# Patient Record
Sex: Male | Born: 1950 | Race: White | Hispanic: No | Marital: Single | Smoking: Never smoker
Health system: Southern US, Community
[De-identification: ages and names within clinical notes are randomized; demographics above are authoritative.]

## PROBLEM LIST (undated history)

## (undated) DIAGNOSIS — Q909 Down syndrome, unspecified: Secondary | ICD-10-CM

## (undated) DIAGNOSIS — E119 Type 2 diabetes mellitus without complications: Secondary | ICD-10-CM

## (undated) HISTORY — DX: Type 2 diabetes mellitus without complications: E11.9

## (undated) HISTORY — DX: Down syndrome, unspecified: Q90.9

---

## 1998-02-08 ENCOUNTER — Other Ambulatory Visit: Admission: RE | Admit: 1998-02-08 | Discharge: 1998-02-08 | Payer: Self-pay | Admitting: *Deleted

## 1998-03-03 ENCOUNTER — Other Ambulatory Visit: Admission: RE | Admit: 1998-03-03 | Discharge: 1998-03-03 | Payer: Self-pay | Admitting: *Deleted

## 1998-04-15 ENCOUNTER — Encounter: Payer: Self-pay | Admitting: Surgery

## 1998-04-20 ENCOUNTER — Encounter: Payer: Self-pay | Admitting: Surgery

## 1998-04-21 ENCOUNTER — Inpatient Hospital Stay (HOSPITAL_COMMUNITY): Admission: RE | Admit: 1998-04-21 | Discharge: 1998-04-22 | Payer: Self-pay | Admitting: Surgery

## 1998-05-10 ENCOUNTER — Emergency Department (HOSPITAL_COMMUNITY): Admission: EM | Admit: 1998-05-10 | Discharge: 1998-05-10 | Payer: Self-pay | Admitting: Emergency Medicine

## 1998-05-10 ENCOUNTER — Encounter: Payer: Self-pay | Admitting: Emergency Medicine

## 2002-04-08 ENCOUNTER — Ambulatory Visit (HOSPITAL_COMMUNITY): Admission: RE | Admit: 2002-04-08 | Discharge: 2002-04-08 | Payer: Self-pay | Admitting: Gastroenterology

## 2002-05-11 ENCOUNTER — Emergency Department (HOSPITAL_COMMUNITY): Admission: EM | Admit: 2002-05-11 | Discharge: 2002-05-12 | Payer: Self-pay | Admitting: *Deleted

## 2002-05-25 ENCOUNTER — Encounter: Payer: Self-pay | Admitting: Specialist

## 2002-05-25 ENCOUNTER — Ambulatory Visit (HOSPITAL_COMMUNITY): Admission: RE | Admit: 2002-05-25 | Discharge: 2002-05-25 | Payer: Self-pay | Admitting: Specialist

## 2002-11-27 ENCOUNTER — Inpatient Hospital Stay (HOSPITAL_COMMUNITY): Admission: EM | Admit: 2002-11-27 | Discharge: 2002-12-01 | Payer: Self-pay | Admitting: Emergency Medicine

## 2002-11-27 ENCOUNTER — Encounter: Payer: Self-pay | Admitting: Emergency Medicine

## 2002-12-08 ENCOUNTER — Encounter: Admission: RE | Admit: 2002-12-08 | Discharge: 2002-12-08 | Payer: Self-pay | Admitting: Family Medicine

## 2003-01-06 ENCOUNTER — Encounter: Admission: RE | Admit: 2003-01-06 | Discharge: 2003-01-06 | Payer: Self-pay | Admitting: Family Medicine

## 2003-01-15 ENCOUNTER — Encounter: Admission: RE | Admit: 2003-01-15 | Discharge: 2003-01-15 | Payer: Self-pay | Admitting: Sports Medicine

## 2003-01-15 ENCOUNTER — Encounter: Payer: Self-pay | Admitting: Sports Medicine

## 2003-01-27 ENCOUNTER — Encounter: Payer: Self-pay | Admitting: Specialist

## 2003-01-27 ENCOUNTER — Ambulatory Visit (HOSPITAL_COMMUNITY): Admission: RE | Admit: 2003-01-27 | Discharge: 2003-01-27 | Payer: Self-pay | Admitting: Specialist

## 2003-08-10 ENCOUNTER — Ambulatory Visit (HOSPITAL_COMMUNITY): Admission: RE | Admit: 2003-08-10 | Discharge: 2003-08-10 | Payer: Self-pay | Admitting: Specialist

## 2003-08-31 ENCOUNTER — Ambulatory Visit (HOSPITAL_COMMUNITY): Admission: RE | Admit: 2003-08-31 | Discharge: 2003-08-31 | Payer: Self-pay | Admitting: Specialist

## 2003-09-03 ENCOUNTER — Emergency Department (HOSPITAL_COMMUNITY): Admission: RE | Admit: 2003-09-03 | Discharge: 2003-09-03 | Payer: Self-pay | Admitting: Specialist

## 2003-10-07 ENCOUNTER — Ambulatory Visit (HOSPITAL_COMMUNITY): Admission: RE | Admit: 2003-10-07 | Discharge: 2003-10-07 | Payer: Self-pay | Admitting: Specialist

## 2003-10-29 ENCOUNTER — Ambulatory Visit (HOSPITAL_COMMUNITY): Admission: RE | Admit: 2003-10-29 | Discharge: 2003-10-29 | Payer: Self-pay | Admitting: Specialist

## 2004-07-11 ENCOUNTER — Ambulatory Visit (HOSPITAL_COMMUNITY): Admission: RE | Admit: 2004-07-11 | Discharge: 2004-07-11 | Payer: Self-pay | Admitting: Specialist

## 2004-12-15 ENCOUNTER — Ambulatory Visit: Payer: Self-pay | Admitting: Gastroenterology

## 2004-12-28 ENCOUNTER — Ambulatory Visit: Payer: Self-pay | Admitting: Gastroenterology

## 2004-12-28 ENCOUNTER — Ambulatory Visit (HOSPITAL_COMMUNITY): Admission: RE | Admit: 2004-12-28 | Discharge: 2004-12-28 | Payer: Self-pay | Admitting: Gastroenterology

## 2006-10-17 DIAGNOSIS — E119 Type 2 diabetes mellitus without complications: Secondary | ICD-10-CM

## 2007-03-25 ENCOUNTER — Ambulatory Visit: Payer: Self-pay | Admitting: Gastroenterology

## 2007-04-04 ENCOUNTER — Ambulatory Visit: Payer: Self-pay | Admitting: Gastroenterology

## 2007-07-24 ENCOUNTER — Ambulatory Visit (HOSPITAL_BASED_OUTPATIENT_CLINIC_OR_DEPARTMENT_OTHER): Admission: RE | Admit: 2007-07-24 | Discharge: 2007-07-24 | Payer: Self-pay | Admitting: Specialist

## 2007-08-23 ENCOUNTER — Ambulatory Visit: Payer: Self-pay | Admitting: Internal Medicine

## 2008-04-27 ENCOUNTER — Emergency Department (HOSPITAL_COMMUNITY): Admission: EM | Admit: 2008-04-27 | Discharge: 2008-04-27 | Payer: Self-pay | Admitting: Emergency Medicine

## 2008-05-24 ENCOUNTER — Encounter: Admission: RE | Admit: 2008-05-24 | Discharge: 2008-05-24 | Payer: Self-pay | Admitting: Specialist

## 2010-05-09 IMAGING — CR DG KNEE COMPLETE 4+V*L*
4 series · 4 of 4 positions shown · non-contrast
Comparison: None.

CLINICAL DATA: Left knee pain.  Fell yesterday.

LEFT KNEE - COMPLETE 4+ VIEW

[view not recorded (1 of 4)]
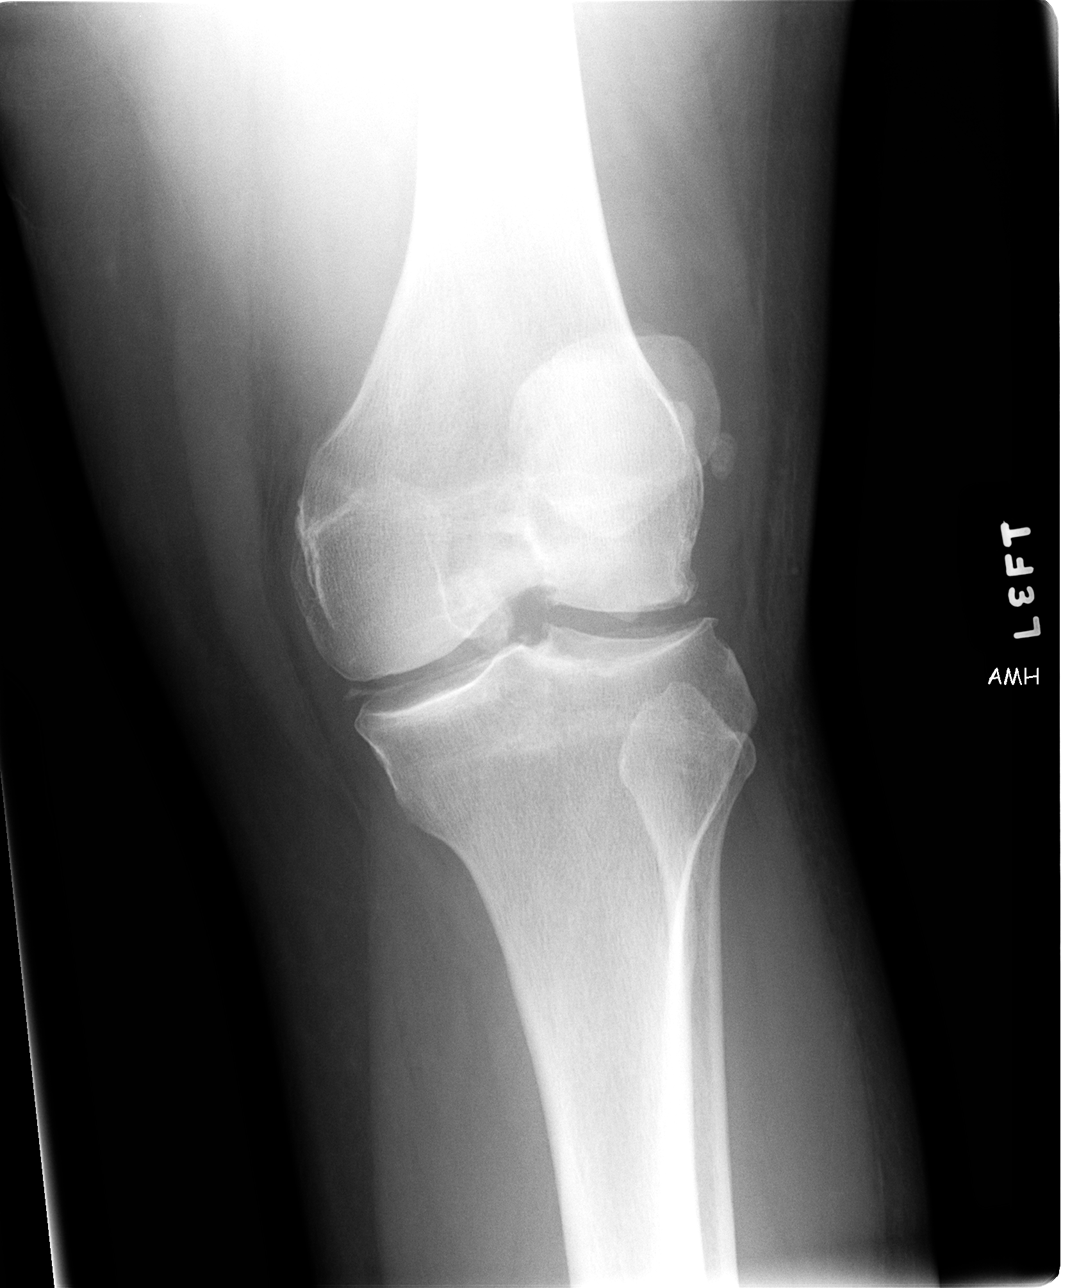

[view not recorded (2 of 4)]
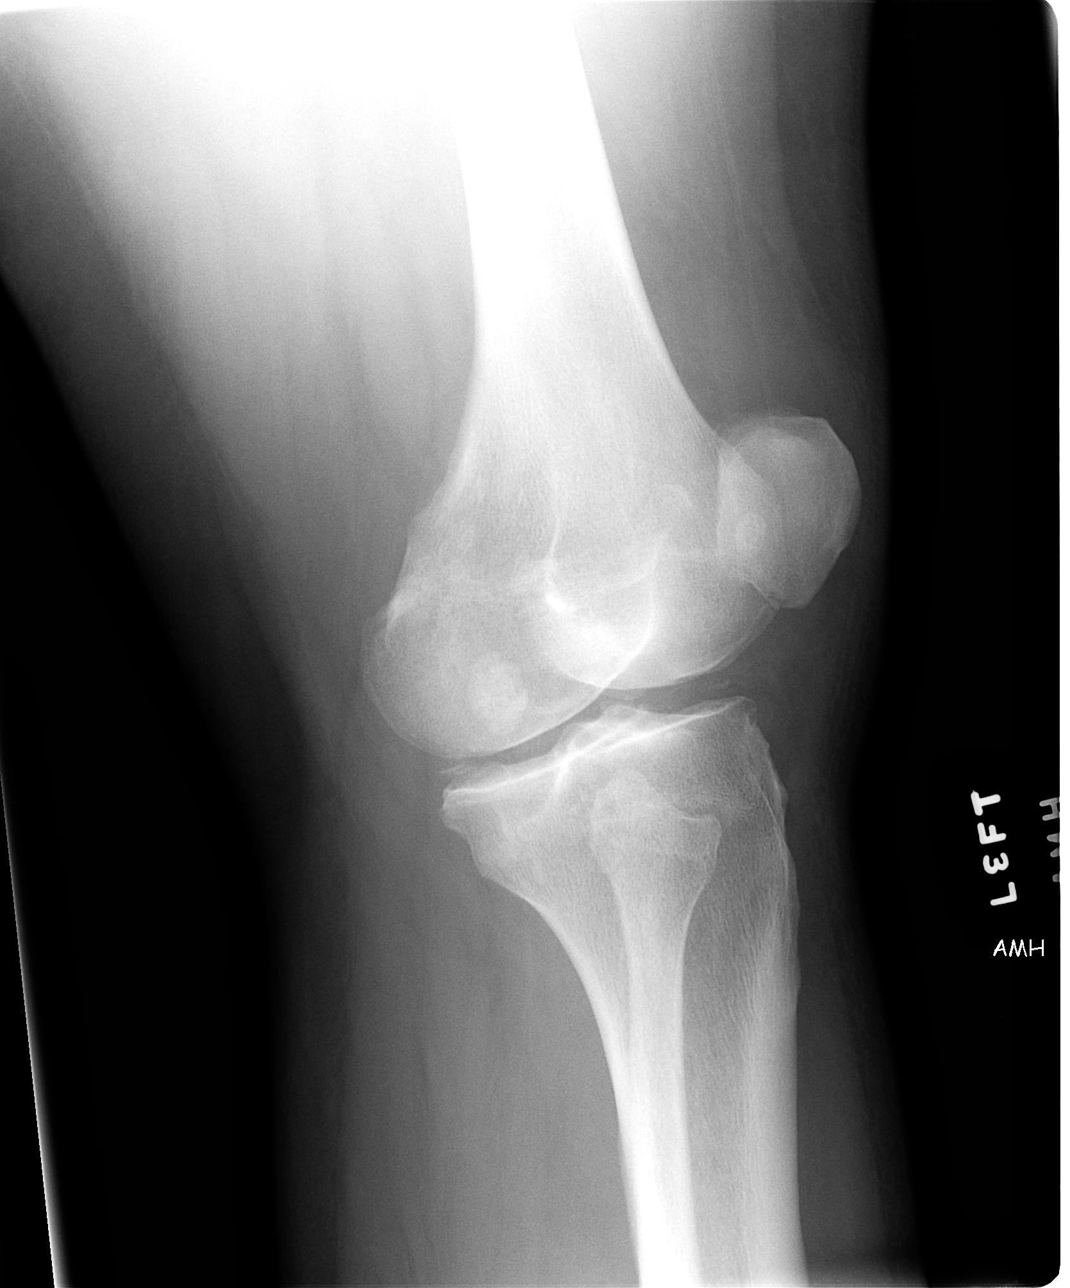

[view not recorded (3 of 4)]
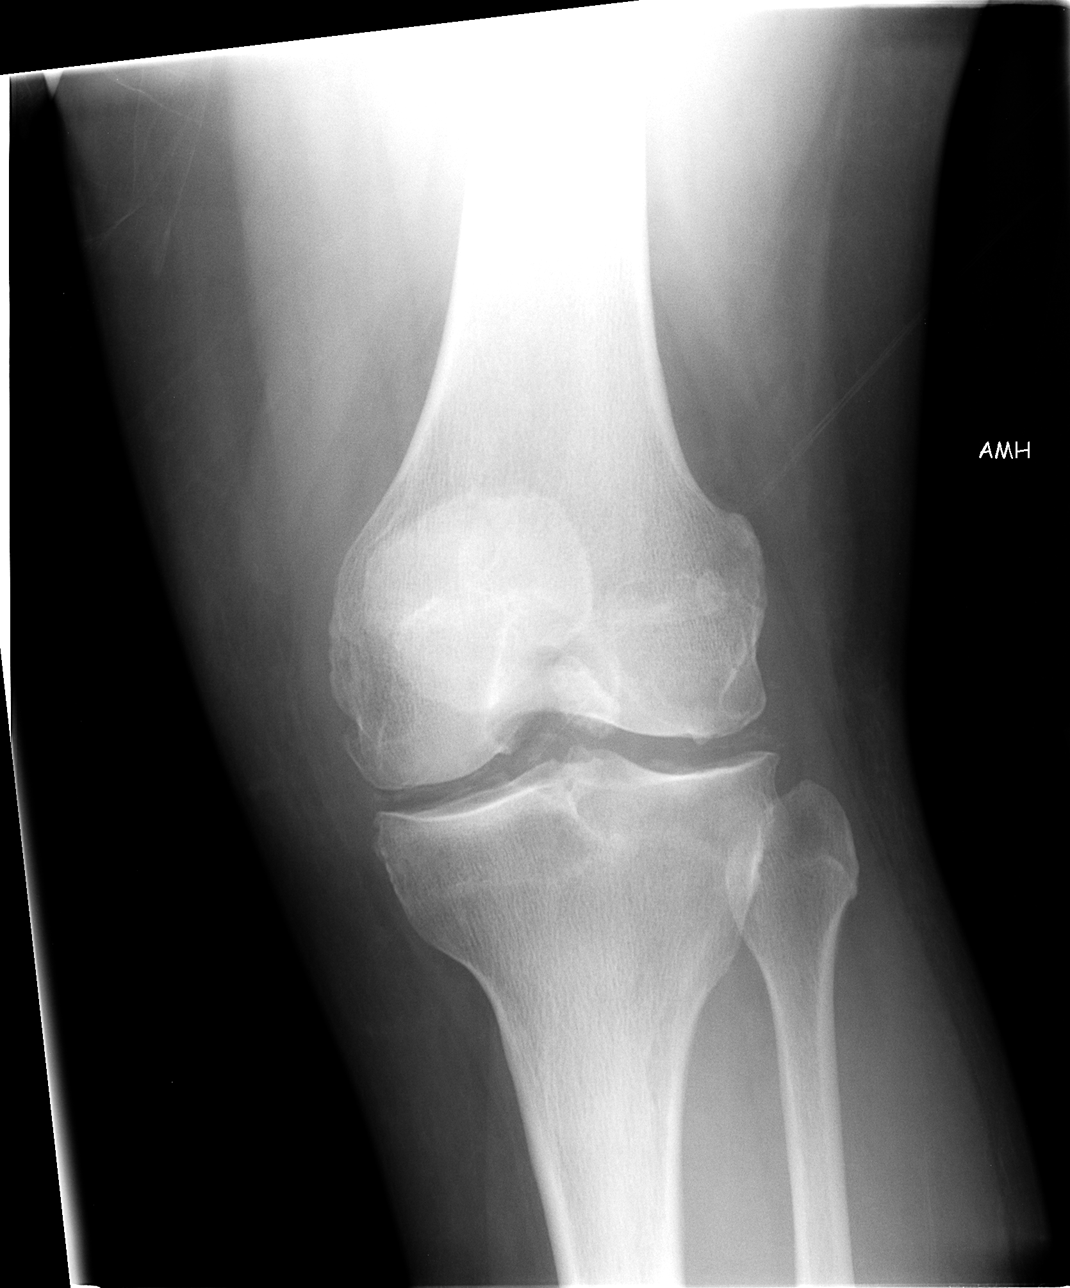

[view not recorded (4 of 4)]
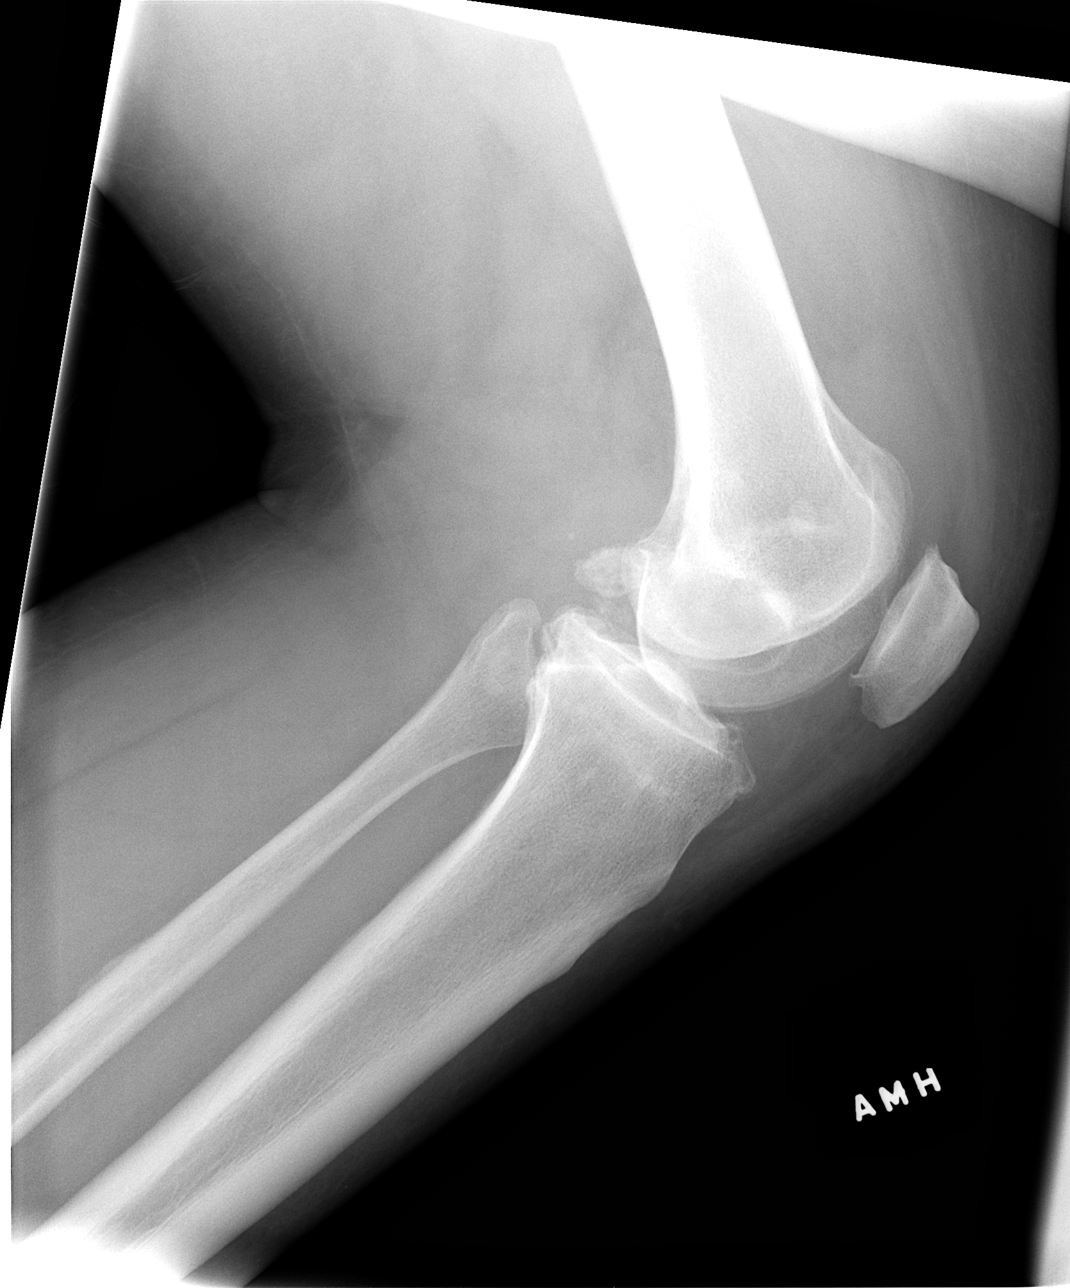

[4 of 4 positions shown; findings below may reference images not displayed]

FINDINGS: Medial and lateral meniscal calcifications.  1.9 cm
posterior intercondylar notch loose body.  Additional smaller
posterior calcified loose bodies.  Mild lateral and posterior
patellar spur formation.  Moderate sized effusion.   No fracture or
dislocation seen.
IMPRESSION: Chondrocalcinosis, degenerative changes, loose bodies and small to
moderate sized effusion.  No fracture or dislocation seen.

## 2010-06-02 ENCOUNTER — Encounter (INDEPENDENT_AMBULATORY_CARE_PROVIDER_SITE_OTHER): Payer: Self-pay | Admitting: *Deleted

## 2010-06-02 ENCOUNTER — Encounter: Admission: RE | Admit: 2010-06-02 | Discharge: 2010-06-02 | Payer: Self-pay | Admitting: Specialist

## 2010-08-02 ENCOUNTER — Encounter: Payer: Self-pay | Admitting: Gastroenterology

## 2010-08-04 ENCOUNTER — Encounter: Payer: Self-pay | Admitting: Gastroenterology

## 2010-08-17 ENCOUNTER — Encounter: Payer: Self-pay | Admitting: Gastroenterology

## 2010-08-24 ENCOUNTER — Encounter: Payer: Self-pay | Admitting: Gastroenterology

## 2010-09-06 ENCOUNTER — Encounter: Payer: Self-pay | Admitting: Gastroenterology

## 2010-09-09 ENCOUNTER — Encounter: Payer: Self-pay | Admitting: Specialist

## 2010-09-14 ENCOUNTER — Ambulatory Visit
Admission: RE | Admit: 2010-09-14 | Discharge: 2010-09-14 | Payer: Self-pay | Source: Home / Self Care | Attending: Gastroenterology | Admitting: Gastroenterology

## 2010-09-14 ENCOUNTER — Other Ambulatory Visit: Payer: Self-pay | Admitting: Gastroenterology

## 2010-09-14 DIAGNOSIS — R1013 Epigastric pain: Secondary | ICD-10-CM | POA: Insufficient documentation

## 2010-09-14 DIAGNOSIS — Q909 Down syndrome, unspecified: Secondary | ICD-10-CM | POA: Insufficient documentation

## 2010-09-14 LAB — URINALYSIS, ROUTINE W REFLEX MICROSCOPIC
Leukocytes, UA: NEGATIVE
Specific Gravity, Urine: <=1.005 (ref 1.000–1.030)
Urobilinogen, UA: 0.2 (ref 0.0–1.0)

## 2010-09-21 NOTE — Assessment & Plan Note (Signed)
Summary: ABD PAIN/YF    History of Present Illness Visit Type: Initial Consult Primary GI MD: Melvia Heaps MD [facility] Primary Provider: Nicolasa Ducking, MD Requesting Provider: Dr Nicolasa Ducking Chief Complaint: Patient c/o lower abdominal pain for several weeks. History of Present Illness:   [name] [name] is a 60 year old white male with Down's syndrome referred at the request of Dr. Midge Aver for evaluation of abdominal pain.  History is very limited because of the patient's inability to communicate effectively.  He complains of some upper abdominal discomfort that may be worsened postprandially.  He also points to the suprapubic area and claims to have pain.  Prior workup includes a CT scan from December, 2011 that demonstrated bilateral renal stones, a right ureteral stone and a fatty liver.  Question of undescended testes was also raised.  Lab work in December, 2011 was pertinent for normal urinalysis, CBC and electrolytes.  The patient has  diabetes.  He is on no gastric irritants including nonsteroidals.   GI Review of Systems    Reports abdominal pain and  vomiting.     Location of  Abdominal pain: lower abdomen.    Denies acid reflux, belching, bloating, chest pain, dysphagia with liquids, dysphagia with solids, heartburn, loss of appetite, nausea, vomiting blood, weight loss, and  weight gain.      Reports diarrhea.     Denies anal fissure, black tarry stools, change in bowel habit, constipation, diverticulosis, fecal incontinence, heme positive stool, hemorrhoids, irritable bowel syndrome, jaundice, light color stool, liver problems, rectal bleeding, and  rectal pain. Preventive Screening-Counseling & Management  Alcohol-Tobacco     Smoking Status: never      Drug Use:  no.      Current Medications (verified): 1)  Actos 15 Mg Tabs (Pioglitazone Hcl) .Marland Kitchen.. 1 By Mouth Once Daily 2)  Lipitor 10 Mg Tabs (Atorvastatin Calcium) .Marland Kitchen.. 1 By Mouth Once Daily 3)  Nasacort Aq 55 Mcg/act Aers  (Triamcinolone Acetonide) .... 2 Sprays Once Daily 4)  Glimepiride 4 Mg Tabs (Glimepiride) .... Take 1 Tablet By Mouth At Supper Time 5)  Acetaminophen 500 Mg Tabs (Acetaminophen) .... Take 1 Tablet By Mouth Two Times A Day For Degenerative Joint Disease 6)  Aquaphor  Oint (Emollient) .... Apply To Hands and Affected Dry Skin Twice Daily 7)  Clindamycin Phosphate 1 % Lotn (Clindamycin Phosphate) .... Apply To Face Twice A Day For Skin Care 8)  Docqlace 100 Mg Caps (Docusate Sodium) .... Take 2 Capsules By Mouth At Bedtime For Constipation 9)  Janumet 50-1000 Mg Tabs (Sitagliptin-Metformin Hcl) .... Take 1 Tablet By Mouth Two Times A Day 10)  Tamsulosin Hcl 0.4 Mg Caps (Tamsulosin Hcl) .... Take 1 Capsule By Mouth Once Daily 11)  Hydrocodone-Acetaminophen 5-325 Mg Tabs (Hydrocodone-Acetaminophen) .... Take 1 Tablet By Mouth Every 6 Hours As Needed For Pain 12)  Vitamin  A& D Ointment .... Apply To Buttocks Three Times A Day As Needed  Allergies (verified): No Known Drug Allergies  Past History:  Past Medical History: Down`s syndrome with Mental retardation Diabetes Hyperlipidemia Degenerative Joint Disease  Past Surgical History: unremarkable  Family History: Reviewed history from 10/17/2006 and no changes required. unknown  Social History: Lives in a group home; works for [employer] Patient has never smoked.  Alcohol Use - no Illicit Drug Use - no Smoking Status:  never Drug Use:  no  Review of Systems  The patient denies allergy/sinus, anemia, anxiety-new, arthritis/joint pain, back pain, blood in urine, breast changes/lumps, change in  vision, confusion, cough, coughing up blood, depression-new, fainting, fatigue, fever, headaches-new, hearing problems, heart murmur, heart rhythm changes, itching, menstrual pain, muscle pains/cramps, night sweats, nosebleeds, pregnancy symptoms, shortness of breath, skin rash, sleeping problems, sore throat, swelling of feet/legs,  swollen lymph glands, thirst - excessive , urination - excessive , urination changes/pain, urine leakage, vision changes, and voice change.         The patient denies symptoms though history is unreliable  Vital Signs:  Patient profile:   60 year old male Weight:      188.50 pounds Pulse rate:   76 / minute Pulse rhythm:   regular BP sitting:   102 / 68  (left arm)  Vitals Entered By: [name] [name] CMA Duncan Dull) (September 14, 2010 11:10 AM)  Physical Exam  Additional Exam:  Physical Exam He Is a Heavyset Male  skin: anicteric HEENT: normocephalic; PEERLA; no nasal or pharyngeal abnormalities neck: supple nodes: no cervical lymphadenopathy chest: clear to ausculatation and percussion heart: no  gallops, or rubs; there is a 1/6 early systolic murmur heard best at the left sternal border abd: soft, nontender; BS normoactive; no abdominal masses, tenderness, organomegaly rectal: deferred ext: no cynanosis, clubbing, edema skeletal: no deformities neuro: oriented x 3; no focal abnormalities    Impression & Recommendations:  Problem # 1:  ABDOMINAL PAIN, EPIGASTRIC (ICD-789.06) History is very limited and symptoms are nonspecific.  Physical exam is unremarkable.  He may have ulcer or nonulcer dyspepsia.  Lab work and prior CT were unrevealing.  Recommendations #1 trial of PPI therapy.  We will start omeprazole 20 mg daily.  If the patient continues to complain of upper abdominal pain despite medications I would consider upper endoscopy. #2 check urinalysis since he is pointing to the suprapubic area Orders: TLB-Udip w/ Micro (81001-URINE)  Problem # 2:  DIABETES MELLITUS II, UNCOMPLICATED (ICD-250.00)  Problem # 3:  DOWNS SYNDROME (ICD-758.0) Assessment: Comment Only  Patient Instructions: 1)  Copy sent to : Nicolasa Ducking, MD 2)  Go to the basment for labs today 3)  The medication list was reviewed and reconciled.  All changed / newly prescribed medications were  explained.  A complete medication list was provided to the patient / caregiver.

## 2010-09-21 NOTE — Procedures (Signed)
Summary: colonoscopy   Colonoscopy  Procedure date:  12/28/2004  Findings:      Results: Normal. Location:  [facility].   Patient Name: [name] [name], [name] [name]. MRN: [id number] Procedure Procedures: Colonoscopy CPT: [phone].  Personnel: Endoscopist: Barbette Hair. Arlyce Dice, MD.  Patient Consent: Procedure, Alternatives, Risks and Benefits discussed, consent obtained,  Indications Symptoms: Hematochezia.  History  Current Medications: Patient is not currently taking Coumadin.  Pre-Exam Physical: Performed Dec 28, 2004. Cardio-pulmonary exam, Rectal exam, HEENT exam , Abdominal exam WNL.  Exam Exam: Extent of exam reached: Cecum, extent intended: Cecum.  The cecum was identified by IC valve. Colon retroflexion performed. ASA Classification: II. Tolerance: good.  Monitoring: Pulse and BP monitoring, Oximetry used. Supplemental O2 given. at 2 Liters.  Colon Prep Used Miralax for colon prep. Prep results: fair, adequate exam.  Sedation Meds: Patient assessed and found to be appropriate for moderate (conscious) sedation. Fentanyl 50 mcg. given IV. Versed 5 mg. given IV.  Findings - NORMAL EXAM: Cecum to Rectum.   Assessment Normal examination.  Events  Unplanned Interventions: No intervention was required.  Unplanned Events: There were no complications. Plans  Post Exam Instructions: Post sedation instructions given.  Patient Education: Patient given standard instructions for: Hemorrhoids. a normal exam.  Comments: Probable limited bleeding secondary to hemorrhoids Scheduling/Referral: Follow-Up prn.  This report was created from the original endoscopy report, which was reviewed and signed by the above listed endoscopist.

## 2010-09-21 NOTE — Procedures (Signed)
Summary: colonoscopy   Colonoscopy  Procedure date:  04/04/2007  Findings:      Results: Normal. Location:  [facility].    Comments:      Repeat colonoscopy in 10 years.   Patient Name: [name] [name], [name] [name]. MRN: [id number] Procedure Procedures: Colonoscopy CPT: [phone].  Personnel: Endoscopist: Barbette Hair. Arlyce Dice, MD.  Patient Consent: Procedure, Alternatives, Risks and Benefits discussed, consent obtained, from patient.  Indications  Average Risk Screening Routine.  History  Current Medications: Patient is not currently taking Coumadin.  Pre-Exam Physical: Performed Apr 04, 2007. Cardio-pulmonary exam, HEENT exam , Abdominal exam, Mental status exam WNL.  Comments: Patient history reviewed/updated, physical performed prior to initiation of sedation?yes Exam Exam: Extent of exam reached: Cecum, extent intended: Cecum.  The cecum was identified by appendiceal orifice and IC valve. Time to Cecum: 00:05: 15. Time for Withdrawl: 00:04:28. Colon retroflexion performed. ASA Classification: II. Tolerance: good.  Monitoring: Pulse and BP monitoring, Oximetry used. Supplemental O2 given. at 2 Liters.  Colon Prep Used Miralax for colon prep. Prep results: excellent.  Sedation Meds: Patient assessed and found to be appropriate for moderate (conscious) sedation. Sedation was managed by the Endoscopist. Fentanyl 50 mcg. given IV. Versed 3 mg. given IV.  Findings NORMAL EXAM: Cecum.  - NORMAL EXAM: Cecum to Rectum.  NORMAL EXAM: Rectum.   Assessment Normal examination.  Events  Unplanned Interventions: No intervention was required.  Unplanned Events: There were no complications. Plans  Post Exam Instructions: Post sedation instructions given.  Patient Education: Patient given standard instructions for: a normal exam.  Disposition: After procedure patient sent to recovery. After recovery patient sent home.  Scheduling/Referral: Colonoscopy, to Barbette Hair.  Arlyce Dice, MD, around Apr 03, 2017.    This report was created from the original endoscopy report, which was reviewed and signed by the above listed endoscopist.

## 2010-09-21 NOTE — Letter (Signed)
Summary: New Patient letter  [facility]  [address] [place], [place] [address]   Phone: [phone]  Fax: [phone]       08/02/2010 MRN: [id number]  [name] [name] [address] [place], [place]  [address]  Dear Mr. WOLZ,  Welcome to the Gastroenterology Division at [facility].    You are scheduled to see Dr.  Arlyce Dice on 09-14-09 at 11am on the 3rd floor at [facility], [address]  We ask that you try to arrive at our office 15 minutes prior to your appointment time to allow for check-in.  We would like you to complete the enclosed self-administered evaluation form prior to your visit and bring it with you on the day of your appointment.  We will review it with you.  Also, please bring a complete list of all your medications or, if you prefer, bring the medication bottles and we will list them.  Please bring your insurance card so that we may make a copy of it.  If your insurance requires a referral to see a specialist, please bring your referral form from your primary care physician.  Co-payments are due at the time of your visit and may be paid by cash, check or credit card.     Your office visit will consist of a consult with your physician (includes a physical exam), any laboratory testing he/she may order, scheduling of any necessary diagnostic testing (e.g. x-ray, ultrasound, CT-scan), and scheduling of a procedure (e.g. Endoscopy, Colonoscopy) if required.  Please allow enough time on your schedule to allow for any/all of these possibilities.    If you cannot keep your appointment, please call [phone] to cancel or reschedule prior to your appointment date.  This allows [place] the opportunity to schedule an appointment for another patient in need of care.  If you do not cancel or reschedule by 5 p.m. the business day prior to your appointment date, you will be charged a $50.00 late cancellation/no-show fee.    Thank you for choosing [facility] for your medical needs.  We appreciate the opportunity to care for you.  Please visit [place] at our website  to learn more about our practice.                     Sincerely,                                                             The Gastroenterology Division

## 2010-09-27 NOTE — Letter (Signed)
Summary: Nicolasa Ducking MD  Nicolasa Ducking MD   Imported By: Sherian Rein 09/22/2010 14:58:14  _____________________________________________________________________  External Attachment:    Type:   Image     Comment:   External Document

## 2010-10-06 ENCOUNTER — Emergency Department (HOSPITAL_BASED_OUTPATIENT_CLINIC_OR_DEPARTMENT_OTHER)
Admission: EM | Admit: 2010-10-06 | Discharge: 2010-10-06 | Disposition: A | Discharge status: Home / Self Care | Payer: Medicare Other | Attending: Emergency Medicine | Admitting: Emergency Medicine

## 2010-10-06 ENCOUNTER — Emergency Department (INDEPENDENT_AMBULATORY_CARE_PROVIDER_SITE_OTHER): Payer: Medicare Other

## 2010-10-06 DIAGNOSIS — E119 Type 2 diabetes mellitus without complications: Secondary | ICD-10-CM | POA: Insufficient documentation

## 2010-10-06 DIAGNOSIS — M1710 Unilateral primary osteoarthritis, unspecified knee: Secondary | ICD-10-CM

## 2010-10-06 DIAGNOSIS — IMO0002 Reserved for concepts with insufficient information to code with codable children: Secondary | ICD-10-CM

## 2010-10-06 DIAGNOSIS — E7800 Pure hypercholesterolemia, unspecified: Secondary | ICD-10-CM | POA: Insufficient documentation

## 2010-10-06 DIAGNOSIS — M25569 Pain in unspecified knee: Secondary | ICD-10-CM

## 2010-10-06 DIAGNOSIS — Z79899 Other long term (current) drug therapy: Secondary | ICD-10-CM | POA: Insufficient documentation

## 2010-10-06 DIAGNOSIS — Q909 Down syndrome, unspecified: Secondary | ICD-10-CM | POA: Insufficient documentation

## 2010-10-06 DIAGNOSIS — G4730 Sleep apnea, unspecified: Secondary | ICD-10-CM | POA: Insufficient documentation

## 2010-10-09 ENCOUNTER — Emergency Department (HOSPITAL_COMMUNITY): Payer: Medicare Other

## 2010-10-09 ENCOUNTER — Observation Stay (HOSPITAL_COMMUNITY): Payer: Medicare Other

## 2010-10-09 ENCOUNTER — Observation Stay (HOSPITAL_COMMUNITY)
Admission: EM | Admit: 2010-10-09 | Discharge: 2010-10-16 | Disposition: A | Discharge status: Home / Self Care | Payer: Medicare Other | Attending: Internal Medicine | Admitting: Internal Medicine

## 2010-10-09 DIAGNOSIS — F79 Unspecified intellectual disabilities: Secondary | ICD-10-CM | POA: Insufficient documentation

## 2010-10-09 DIAGNOSIS — R262 Difficulty in walking, not elsewhere classified: Secondary | ICD-10-CM | POA: Insufficient documentation

## 2010-10-09 DIAGNOSIS — J4489 Other specified chronic obstructive pulmonary disease: Secondary | ICD-10-CM | POA: Insufficient documentation

## 2010-10-09 DIAGNOSIS — E119 Type 2 diabetes mellitus without complications: Secondary | ICD-10-CM | POA: Insufficient documentation

## 2010-10-09 DIAGNOSIS — R0989 Other specified symptoms and signs involving the circulatory and respiratory systems: Secondary | ICD-10-CM | POA: Insufficient documentation

## 2010-10-09 DIAGNOSIS — R0609 Other forms of dyspnea: Secondary | ICD-10-CM | POA: Insufficient documentation

## 2010-10-09 DIAGNOSIS — E860 Dehydration: Secondary | ICD-10-CM | POA: Insufficient documentation

## 2010-10-09 DIAGNOSIS — J449 Chronic obstructive pulmonary disease, unspecified: Secondary | ICD-10-CM | POA: Insufficient documentation

## 2010-10-09 DIAGNOSIS — M25569 Pain in unspecified knee: Secondary | ICD-10-CM | POA: Insufficient documentation

## 2010-10-09 DIAGNOSIS — M1710 Unilateral primary osteoarthritis, unspecified knee: Secondary | ICD-10-CM | POA: Insufficient documentation

## 2010-10-09 DIAGNOSIS — J209 Acute bronchitis, unspecified: Secondary | ICD-10-CM | POA: Insufficient documentation

## 2010-10-09 DIAGNOSIS — R0902 Hypoxemia: Secondary | ICD-10-CM | POA: Insufficient documentation

## 2010-10-09 DIAGNOSIS — J309 Allergic rhinitis, unspecified: Secondary | ICD-10-CM | POA: Insufficient documentation

## 2010-10-09 DIAGNOSIS — M1120 Other chondrocalcinosis, unspecified site: Secondary | ICD-10-CM | POA: Insufficient documentation

## 2010-10-09 DIAGNOSIS — R0602 Shortness of breath: Principal | ICD-10-CM | POA: Insufficient documentation

## 2010-10-09 DIAGNOSIS — G4733 Obstructive sleep apnea (adult) (pediatric): Secondary | ICD-10-CM | POA: Insufficient documentation

## 2010-10-09 DIAGNOSIS — Q909 Down syndrome, unspecified: Secondary | ICD-10-CM | POA: Insufficient documentation

## 2010-10-09 DIAGNOSIS — R4182 Altered mental status, unspecified: Secondary | ICD-10-CM | POA: Insufficient documentation

## 2010-10-09 LAB — URINALYSIS, ROUTINE W REFLEX MICROSCOPIC
Leukocytes, UA: NEGATIVE
Specific Gravity, Urine: 1.029 (ref 1.005–1.030)
Urine Glucose, Fasting: NEGATIVE mg/dL
pH: 5.5 (ref 5.0–8.0)

## 2010-10-09 LAB — TROPONIN I

## 2010-10-09 LAB — DIFFERENTIAL
Basophils Absolute: 0 10*3/uL (ref 0.0–0.1)
Basophils Relative: 0 % (ref 0–1)
Eosinophils Absolute: 0 10*3/uL (ref 0.0–0.7)
Eosinophils Relative: 0 % (ref 0–5)
Lymphocytes Relative: 11 % — ABNORMAL LOW (ref 12–46)
Lymphs Abs: 1.4 10*3/uL (ref 0.7–4.0)
Monocytes Absolute: 1.8 10*3/uL — ABNORMAL HIGH (ref 0.1–1.0)
Monocytes Relative: 13 % — ABNORMAL HIGH (ref 3–12)
Neutro Abs: 10 10*3/uL — ABNORMAL HIGH (ref 1.7–7.7)
Neutrophils Relative %: 76 % (ref 43–77)

## 2010-10-09 LAB — COMPREHENSIVE METABOLIC PANEL
Albumin: 3.6 g/dL (ref 3.5–5.2)
Alkaline Phosphatase: 51 U/L (ref 39–117)
BUN: 22 mg/dL (ref 6–23)
Calcium: 9.2 mg/dL (ref 8.4–10.5)
Creatinine, Ser: 1.34 mg/dL (ref 0.4–1.5)
Glucose, Bld: 143 mg/dL — ABNORMAL HIGH (ref 70–99)
Potassium: 4.2 mEq/L (ref 3.5–5.1)
Total Protein: 7.4 g/dL (ref 6.0–8.3)

## 2010-10-09 LAB — BILIRUBIN, FRACTIONATED(TOT/DIR/INDIR)
Bilirubin, Direct: 0.2 mg/dL (ref 0.0–0.3)
Indirect Bilirubin: 1.3 mg/dL — ABNORMAL HIGH (ref 0.3–0.9)

## 2010-10-09 LAB — URINE MICROSCOPIC-ADD ON

## 2010-10-09 LAB — CBC
HCT: 42.4 % (ref 39.0–52.0)
Hemoglobin: 14.2 g/dL (ref 13.0–17.0)
MCH: 32 pg (ref 26.0–34.0)
MCHC: 33.5 g/dL (ref 30.0–36.0)
MCV: 95.5 fL (ref 78.0–100.0)
Platelets: 256 10*3/uL (ref 150–400)
RBC: 4.44 MIL/uL (ref 4.22–5.81)
RDW: 15.6 % — ABNORMAL HIGH (ref 11.5–15.5)
WBC: 13.3 10*3/uL — ABNORMAL HIGH (ref 4.0–10.5)

## 2010-10-09 LAB — CK TOTAL AND CKMB (NOT AT ARMC): Total CK: 146 U/L (ref 7–232)

## 2010-10-09 LAB — GLUCOSE, CAPILLARY: Glucose-Capillary: 119 mg/dL — ABNORMAL HIGH (ref 70–99)

## 2010-10-10 DIAGNOSIS — M79609 Pain in unspecified limb: Secondary | ICD-10-CM

## 2010-10-10 LAB — CBC
HCT: 34.9 % — ABNORMAL LOW (ref 39.0–52.0)
Hemoglobin: 11.4 g/dL — ABNORMAL LOW (ref 13.0–17.0)
MCH: 31.3 pg (ref 26.0–34.0)
MCV: 95.9 fL (ref 78.0–100.0)
Platelets: 227 10*3/uL (ref 150–400)
RBC: 3.64 MIL/uL — ABNORMAL LOW (ref 4.22–5.81)

## 2010-10-10 LAB — GLUCOSE, CAPILLARY
Glucose-Capillary: 110 mg/dL — ABNORMAL HIGH (ref 70–99)
Glucose-Capillary: 107 mg/dL — ABNORMAL HIGH (ref 70–99)

## 2010-10-10 LAB — COMPREHENSIVE METABOLIC PANEL
ALT: 14 U/L (ref 0–53)
AST: 14 U/L (ref 0–37)
Alkaline Phosphatase: 44 U/L (ref 39–117)
CO2: 26 mEq/L (ref 19–32)
Chloride: 105 mEq/L (ref 96–112)
GFR calc Af Amer: >60 mL/min (ref >60)
GFR calc non Af Amer: 55 mL/min — ABNORMAL LOW (ref >60)
Potassium: 3.8 mEq/L (ref 3.5–5.1)
Sodium: 143 mEq/L (ref 135–145)
Total Bilirubin: 1.5 mg/dL — ABNORMAL HIGH (ref 0.3–1.2)

## 2010-10-10 LAB — CK TOTAL AND CKMB (NOT AT ARMC): CK, MB: 0.9 ng/mL (ref 0.3–4.0)

## 2010-10-10 LAB — HEMOGLOBIN A1C
Hgb A1c MFr Bld: 5.8 % — ABNORMAL HIGH (ref <5.7)
Mean Plasma Glucose: 120 mg/dL — ABNORMAL HIGH (ref <117)

## 2010-10-10 LAB — MAGNESIUM: Magnesium: 1.7 mg/dL (ref 1.5–2.5)

## 2010-10-10 LAB — TROPONIN I
Troponin I: 0.01 ng/mL (ref 0.00–0.06)
Troponin I: 0.01 ng/mL (ref 0.00–0.06)

## 2010-10-10 MED ORDER — IOHEXOL 300 MG/ML  SOLN
100.0000 mL | Freq: Once | INTRAMUSCULAR | Status: AC | PRN
  Administered 2010-10-10: 100 mL via INTRAVENOUS

## 2010-10-11 LAB — COMPREHENSIVE METABOLIC PANEL
Albumin: 2.6 g/dL — ABNORMAL LOW (ref 3.5–5.2)
Alkaline Phosphatase: 49 U/L (ref 39–117)
BUN: 20 mg/dL (ref 6–23)
Calcium: 8.2 mg/dL — ABNORMAL LOW (ref 8.4–10.5)
Potassium: 3.7 mEq/L (ref 3.5–5.1)
Total Protein: 6 g/dL (ref 6.0–8.3)

## 2010-10-11 LAB — GLUCOSE, CAPILLARY
Glucose-Capillary: 147 mg/dL — ABNORMAL HIGH (ref 70–99)
Glucose-Capillary: 110 mg/dL — ABNORMAL HIGH (ref 70–99)
Glucose-Capillary: 96 mg/dL (ref 70–99)
Glucose-Capillary: 97 mg/dL (ref 70–99)

## 2010-10-11 LAB — MAGNESIUM: Magnesium: 2.3 mg/dL (ref 1.5–2.5)

## 2010-10-11 LAB — CBC
MCH: 31.6 pg (ref 26.0–34.0)
Platelets: 229 10*3/uL (ref 150–400)
RBC: 3.8 MIL/uL — ABNORMAL LOW (ref 4.22–5.81)
RDW: 15.4 % (ref 11.5–15.5)
WBC: 8.5 10*3/uL (ref 4.0–10.5)

## 2010-10-11 LAB — BRAIN NATRIURETIC PEPTIDE: Pro B Natriuretic peptide (BNP): <30 pg/mL (ref 0.0–100.0)

## 2010-10-12 LAB — GLUCOSE, CAPILLARY
Glucose-Capillary: 156 mg/dL — ABNORMAL HIGH (ref 70–99)
Glucose-Capillary: 94 mg/dL (ref 70–99)

## 2010-10-13 LAB — GLUCOSE, CAPILLARY
Glucose-Capillary: 129 mg/dL — ABNORMAL HIGH (ref 70–99)
Glucose-Capillary: 142 mg/dL — ABNORMAL HIGH (ref 70–99)

## 2010-10-13 LAB — BASIC METABOLIC PANEL
Chloride: 105 mEq/L (ref 96–112)
GFR calc Af Amer: >60 mL/min (ref >60)
Potassium: 3.9 mEq/L (ref 3.5–5.1)

## 2010-10-13 LAB — CBC
MCH: 32.4 pg (ref 26.0–34.0)
MCHC: 33.6 g/dL (ref 30.0–36.0)
RBC: 3.8 MIL/uL — ABNORMAL LOW (ref 4.22–5.81)
RDW: 15.1 % (ref 11.5–15.5)
WBC: 7.4 10*3/uL (ref 4.0–10.5)

## 2010-10-14 LAB — APTT: aPTT: 32 seconds (ref 24–37)

## 2010-10-14 LAB — PROTIME-INR
INR: 1.08 (ref 0.00–1.49)
Prothrombin Time: 14.2 seconds (ref 11.6–15.2)

## 2010-10-14 LAB — GLUCOSE, CAPILLARY: Glucose-Capillary: 96 mg/dL (ref 70–99)

## 2010-10-14 NOTE — H&P (Signed)
[name], [name] NO.:  [id number]  MEDICAL RECORD NO.:  [id number]           PATIENT TYPE:  E  LOCATION:  [facility]                         FACILITY:  [facility]  PHYSICIAN:  Vania Rea, M.D. DATE OF BIRTH:  May 14, 1951  DATE OF ADMISSION:  10/09/2010 DATE OF DISCHARGE:                             HISTORY & PHYSICAL   PRIMARY CARE PHYSICIAN:  Dr. Jeri Lager urologist with [facility].  CHIEF COMPLAINT:  Difficulty breathing.  HISTORY OF PRESENT ILLNESS:  This is a 60 year old Caucasian gentleman with Down syndrome and mental retardation.  He is a resident of [facility] here in [place] and has been in fair baseline state of health usually ambulates and is interactive, but has recently been complaining of severe pain in his knees.  Has been diagnosed with severe osteoarthritis of his knees and has not walked since Saturday 3 days ago.  Yesterday, he got out of bed with the assistance of a wheelchair, but today has been in bed, confined, told one of his caregivers that he did not feel well, was having difficulty breathing and requests a CPAP machine.  When the CPAP was applied, the patient began to have worse difficulty breathing, acting abnormally and the EMS was called.  EMS reports that the patient responded to 100% nonrebreather and he appeared confused.  On coming to the emergency room, the patient is now off CPAP and off oxygen.  He is oxygenating adequately and he is back to his baseline mental status.  He shows no evidence of being short of breath.  For the past few days, the patient has also been incontinent of urine and it was felt to be related to problems getting out of bed because of his severe arthritis.  Because of his unexplained episode of confusion and hypoxia, the hospitalist service has been called to assist with management.  There is no history of fever, cough, cold, chest pains or shortness of breath.  There is no known history  of cardiac disease.  PAST MEDICAL HISTORY: 1. Diabetes type 2. 2. Down syndrome. 3. Mental retardation. 4. Hyperlipidemia. 5. Obstructive sleep apnea on CPAP. 6. History of allergic rhinitis. 7. Severe osteoarthritis of the knees.  MEDICATIONS:  Include; 1. Clindamycin topical lotion to face. 2. Aquaphor ointment to hands for dry skin. 3. Vitamins A and D topical ointment for buttocks. 4. Tamsulosin 0.4 mg daily. 5. Omeprazole 20 mg daily. 6. Nasonex nasal spray 2 sprays in each nostril each morning. 7. Lipitor 10 mg at bedtime. 8. Janumet 12/998 1 tablet twice daily. 9. Glimepiride 1 mg each evening. 10.Colace 100 mg 2 capsules at bedtime. 11.Actos 15 mg daily. 12.Acetaminophen 500 mg twice daily.  ALLERGIES:  No known drug allergies.  SOCIAL HISTORY:  No history of tobacco, alcohol or illicit drug use.  He is chronically disabled resident of group home.  FAMILY HISTORY:  Unable to obtain.  He says that all of his family have passed away.  His records lists [name] [name] as his legal guardian per DSS.  REVIEW OF SYSTEMS:  Other than noted above significant only for  problems passing urine 1 month ago, resolved with initiation of Flomax by [facility].  PHYSICAL EXAMINATION:  GENERAL:  Very pleasant, middle-aged Caucasian gentleman sitting up in the stretcher, drinking diet sodas. VITAL SIGNS:  Temperature is 99.2, pulse 99, respiration 20, blood pressure 122/77.  He is saturating at 97% on room air. HEENT:  Pupils were round and equal.  Mucous membranes pink.  Anicteric. He has mongoloid face. NECK:  No cervical lymphadenopathy or thyromegaly.  No carotid bruits. CHEST:  Clear to auscultation bilaterally. CARDIOVASCULAR:  Regular rhythm.  No murmur heard. ABDOMEN:  Obese, soft, nontender. EXTREMITIES:  He keeps his both legs externally rotated and he seems to have pain when asked to straighten his legs out.  He has deformities of both knees and there  seems to be an joint effusions bilaterally right greater than left.  He reports tenderness in the left knee, but denies tenderness in the right knee.  He also has some deformities of both ankles.  Dorsalis pedis pulses are 2+ bilaterally.  Has no edema. CENTRAL NERVOUS SYSTEM:  Cranial nerves III-XII appear grossly intact and he has no focal lateralizing signs.  LABORATORY DATA:  His white count is 13.3, hemoglobin 14.2, platelets 256.  Absolute neutrophil count is 10.  There is no comment on morphology.  His sodium is 144, potassium 4.2, chloride 107, CO2 of 25, glucose 143, BUN elevated to 22 and creatinine elevated to 1.34.  His total bilirubin is elevated at 1.9.  His liver functions are otherwise unremarkable.  His calcium is 9.2.  Urinalysis shows specific gravity of 1.029, 100 proteins, negative for nitrites and leukocyte esterase. Urine microscopy is unremarkable.  Two-view chest x-ray shows no acute disease, heart size end vascularity are normal.  EKG shows sinus rhythm with questionable left atrial abnormality.  ASSESSMENT: 1. Transient hypoxia and confusion of unclear etiology.  Differential     would include a pulmonary embolus, especially since patient has     been bed confined for a few days.  We will check a D-dimer and if     elevated will go proceed with a CT angiogram of the chest after     hydration. 2. Diabetes type 2, seems fairly well controlled.  We will continue     his home medications and add sliding scale. 3. Dehydration, as evidenced by his labs.  We will hydrate. 4. Mental retardation and Down syndrome.  We will continue supportive     care. 5. Osteoarthritis.  We will continue Tylenol as tolerated and we will     consider an Orthopedic evaluation. 6. Other plans as per orders.     Vania Rea, M.D.     LC/MEDQ  D:  10/09/2010  T:  10/09/2010  Job:  [id number]  Electronically Signed by Vania Rea M.D. on 10/14/2010 05:16:14 AM

## 2010-10-15 LAB — GLUCOSE, CAPILLARY
Glucose-Capillary: 174 mg/dL — ABNORMAL HIGH (ref 70–99)
Glucose-Capillary: 226 mg/dL — ABNORMAL HIGH (ref 70–99)
Glucose-Capillary: 185 mg/dL — ABNORMAL HIGH (ref 70–99)

## 2010-10-16 LAB — GLUCOSE, CAPILLARY: Glucose-Capillary: 131 mg/dL — ABNORMAL HIGH (ref 70–99)

## 2010-10-28 NOTE — Discharge Summary (Signed)
NAMEJAMIERE, [name] [name] NO.:  [id number]  MEDICAL RECORD NO.:  [id number]           PATIENT TYPE:  I  LOCATION:  6708                         FACILITY:  [facility]  PHYSICIAN:  Richarda Overlie, MD       DATE OF BIRTH:  03/12/1951  DATE OF ADMISSION:  10/09/2010 DATE OF DISCHARGE:  10/16/2010                        DISCHARGE SUMMARY - REFERRING   PRIMARY CARE PHYSICIAN:  Dr. Julius Bowels, urologist with [facility].  DISCHARGE DIAGNOSES: 1. Shortness of breath likely secondary to acute bronchitis and     transient hypoxia likely secondary to acute bronchitis. 2. Chondrocalcinosis of the left knee status post cortical steroid     injection by Orthopedics. 3. Diabetes control. 4. Mental retardation. 5. Obstructive sleep apnea with continuous use of CPAP at night. 6. Severe osteoarthritis. 7. Down syndrome 8. Hyperlipidemia. 9. Allergic rhinitis.  DISCHARGE MEDICATIONS: 1. Prednisone taper. 2. Tramadol 25 mg p.o. three times a day p.r.n. 3. Tylenol 500 p.o. twice daily p.r.n. 4. Actos 15 mg p.o. daily. 5. Aquaphor one application topical twice daily. 6. Clindamycin topical to face or skin one application topical twice     daily. 7. Colace 100 mg 2 capsules p.o. at bedtime. 8. Amaryl 1 mg p.o. in the evening. 9. Janumet 50/1000 one tablet p.o. twice daily. 10.Lipitor on 10 mg p.o. daily. 11.Nasonex 2 sprays in each nostril in the morning. 12.Omeprazole 20 mg p.o. daily. 13.Flomax 0.4 mg 1 capsule p.o. daily. 14.Vitamin A and D one application topical three times a day.  SUBJECTIVE:  This is a 60 year old Caucasian male with a history of Down syndrome and mental retardation who currently lives in an [facility] in [place] who presented to the ED because of shortness of breath. The patient also complained of severe knee pain and was diagnosed with severe osteoarthritis of his knee and has had difficulty ambulation for about 3 days prior to admission.  The  patient also has obstructive sleep apnea and uses CPAP at bedtime.  He was admitted for further evaluation.  HOSPITAL COURSE: 1. Shortness of breath thought to be secondary to acute bronchitis.     The patient had an oxygen saturation of 97% on room air.  On     initial admission, chest x-ray showed no acute disease.  CT angio     of the chest did not show any evidence of PE.  Evaluation showed     some diffuse ground-glass attenuation suggestive of edema,     atelectasis, and mild cardiomegaly.  The patient had a BNP done,     which was less than 30.  His cardiac enzymes were cycled and were     found to be negative.  The patient currently is not hypoxic anymore     and he was empirically started on Avelox for 3 days and incentive     spirometry and his hypoxia resolved. 2. Hypoxia.  There was a questionable history of a PE because of his     elevated D-dimer of 1.08.  He had a Doppler ultrasound of bilateral     lower extremities on  the 21st that did not show any evidence of     DVT. 3. Left knee pain.  The patient was found to have severe     osteoarthritis and chondrocalcinosis on radiographs of his left     knee with tricompartmental degenerative changes and loose body.     Orthopedic Service was consulted and the patient had a     corticosteroid injection of the left knee done followed by ice     packs.  DISPOSITION:  The patient has been quite anxious to bear weight.  At this point, there is no obvious cause for the patient's inability to bear weight on both this knees.  I feel this is a big component of anxiety and the patient needs continuous reassurance to be able to bear weight; however, since the patient has been hesitant to do this and if he is unable to bear weight today then we will consider placing him in a skilled nursing facility until his symptoms improve, but if he does ambulate today successfully then he will be discharged back to group home.     Richarda Overlie, MD     NA/MEDQ  D:  10/16/2010  T:  10/16/2010  Job:  [id number]  Electronically Signed by Richarda Overlie MD on 10/28/2010 08:14:08 AM

## 2010-11-06 ENCOUNTER — Ambulatory Visit: Payer: Medicare Other | Attending: Orthopedic Surgery | Admitting: Physical Therapy

## 2010-11-06 DIAGNOSIS — R262 Difficulty in walking, not elsewhere classified: Secondary | ICD-10-CM | POA: Insufficient documentation

## 2010-11-06 DIAGNOSIS — M25519 Pain in unspecified shoulder: Secondary | ICD-10-CM | POA: Insufficient documentation

## 2010-11-06 DIAGNOSIS — M25619 Stiffness of unspecified shoulder, not elsewhere classified: Secondary | ICD-10-CM | POA: Insufficient documentation

## 2010-11-06 DIAGNOSIS — IMO0001 Reserved for inherently not codable concepts without codable children: Secondary | ICD-10-CM | POA: Insufficient documentation

## 2010-11-08 ENCOUNTER — Ambulatory Visit: Payer: Medicare Other | Admitting: Physical Therapy

## 2010-11-14 ENCOUNTER — Ambulatory Visit: Payer: Medicare Other | Admitting: Physical Therapy

## 2010-11-16 ENCOUNTER — Ambulatory Visit: Payer: Medicare Other | Admitting: Physical Therapy

## 2010-11-16 ENCOUNTER — Ambulatory Visit: Payer: Medicare Other | Attending: Orthopedic Surgery | Admitting: Physical Therapy

## 2010-11-16 DIAGNOSIS — IMO0001 Reserved for inherently not codable concepts without codable children: Secondary | ICD-10-CM | POA: Insufficient documentation

## 2010-11-16 DIAGNOSIS — M25569 Pain in unspecified knee: Secondary | ICD-10-CM | POA: Insufficient documentation

## 2010-11-16 DIAGNOSIS — M25519 Pain in unspecified shoulder: Secondary | ICD-10-CM | POA: Insufficient documentation

## 2010-11-16 DIAGNOSIS — R262 Difficulty in walking, not elsewhere classified: Secondary | ICD-10-CM | POA: Insufficient documentation

## 2010-11-20 ENCOUNTER — Ambulatory Visit: Payer: Medicare Other | Admitting: Physical Therapy

## 2010-11-22 ENCOUNTER — Ambulatory Visit: Payer: Medicare Other | Attending: Orthopedic Surgery | Admitting: Physical Therapy

## 2010-11-22 DIAGNOSIS — M25619 Stiffness of unspecified shoulder, not elsewhere classified: Secondary | ICD-10-CM | POA: Insufficient documentation

## 2010-11-22 DIAGNOSIS — R262 Difficulty in walking, not elsewhere classified: Secondary | ICD-10-CM | POA: Insufficient documentation

## 2010-11-22 DIAGNOSIS — M25519 Pain in unspecified shoulder: Secondary | ICD-10-CM | POA: Insufficient documentation

## 2010-11-22 DIAGNOSIS — IMO0001 Reserved for inherently not codable concepts without codable children: Secondary | ICD-10-CM | POA: Insufficient documentation

## 2010-11-23 ENCOUNTER — Ambulatory Visit: Payer: Medicare Other | Admitting: Physical Therapy

## 2010-11-27 ENCOUNTER — Ambulatory Visit: Payer: Medicare Other | Admitting: Physical Therapy

## 2010-11-28 ENCOUNTER — Ambulatory Visit: Payer: Medicare Other | Admitting: Physical Therapy

## 2010-12-04 ENCOUNTER — Ambulatory Visit: Payer: Medicare Other | Admitting: Physical Therapy

## 2010-12-06 ENCOUNTER — Ambulatory Visit: Payer: Medicare Other | Admitting: Physical Therapy

## 2010-12-11 ENCOUNTER — Ambulatory Visit: Payer: Medicare Other | Admitting: Physical Therapy

## 2010-12-13 ENCOUNTER — Ambulatory Visit: Payer: Medicare Other | Admitting: Physical Therapy

## 2010-12-19 ENCOUNTER — Ambulatory Visit: Payer: Medicare Other | Attending: Orthopedic Surgery | Admitting: Physical Therapy

## 2010-12-19 DIAGNOSIS — IMO0001 Reserved for inherently not codable concepts without codable children: Secondary | ICD-10-CM | POA: Insufficient documentation

## 2010-12-19 DIAGNOSIS — M25519 Pain in unspecified shoulder: Secondary | ICD-10-CM | POA: Insufficient documentation

## 2010-12-19 DIAGNOSIS — M25619 Stiffness of unspecified shoulder, not elsewhere classified: Secondary | ICD-10-CM | POA: Insufficient documentation

## 2010-12-19 DIAGNOSIS — R262 Difficulty in walking, not elsewhere classified: Secondary | ICD-10-CM | POA: Insufficient documentation

## 2010-12-25 ENCOUNTER — Ambulatory Visit: Payer: Medicare Other | Admitting: Rehabilitation

## 2010-12-27 ENCOUNTER — Ambulatory Visit: Payer: Medicare Other | Admitting: Rehabilitation

## 2011-01-02 NOTE — Procedures (Signed)
[name], [name] NO.:  [id number]   MEDICAL RECORD NO.:  [id number]          PATIENT TYPE:  OUT   LOCATION:  SLEEP CENTER                 FACILITY:  [facility]   PHYSICIAN:  Clinton D. Maple Hudson, MD, FCCP, FACPDATE OF BIRTH:  08-14-1953   DATE OF STUDY:  07/24/2007                            NOCTURNAL POLYSOMNOGRAM   REFERRING PHYSICIAN:  Nicolasa Ducking, M.D.   REFERRING PHYSICIAN:  Nicolasa Ducking, M.D.   INDICATION FOR STUDY:  Hypersomnia with sleep apnea.   EPWORTH SLEEPINESS SCORE:  15/24.   BMI 40.1. Weight 212 pounds. Height 61 inches. The patient reported to  have Down syndrome.   MEDICATIONS:  Home medications listed and reviewed.   SLEEP ARCHITECTURE:  Sleep study protocol. During the diagnostic phase,  total sleep time 129 minutes with sleep sufficiency 89%. Stage I was  1.2%. Stage II 98.8%. Stages III and REM were absent. Sleep latency 16  minutes. Awake after sleep onset 0. Arousal index 1.4. No bedtime  medication was taken.   RESPIRATORY DATA:  Split-study protocol. Apnea/hypopnea index (AHI) 58.4  obstructive events per hour indicating severe obstructive sleep  apnea/hypopnea syndrome before CPAP. This included two obstructive  apneas and 124 hypopneas before CPAP. CPAP was then titrated to 11 CWP,  AHI 0 per hour. A medium Mirage Quattro full-face mask was used with  heated humidifier.   OXYGEN DATA:  Very loud snoring with oxygen desaturation to a nadir of  82%. After CPAP titration, oxygen saturation held 91.2% on room air.   CARDIAC DATA:  Normal sinus rhythm.   MOVEMENT-PARASOMNIA:  No significant movement disturbance. No bathroom  trips.   IMPRESSIONS-RECOMMENDATIONS:  1. Severe obstructive sleep apnea/hypopnea syndrome, apnea/hypopnea      index 58.4 per hour with nonpositional events, loud snoring and      oxygen desaturation to a nadir of 82%.  2. Successful CPAP titration to 11 CWP, AHI 0 per hour. A medium      Mirage  Quattro full-face mask was used with heated humidifier and      well tolerated.      Clinton D. Maple Hudson, MD, [facility], FACP  Diplomate, Biomedical engineer of Sleep Medicine  Electronically Signed     CDY/MEDQ  D:  07/27/2007 16:13:00  T:  07/28/2007 08:25:34  Job:  [id number]

## 2011-01-05 NOTE — Op Note (Signed)
   NAME:  [name] [name], [name] [name]                             ACCOUNT NO.:  [id number]   MEDICAL RECORD NO.:  [id number]                   PATIENT TYPE:  AMB   LOCATION:  ENDO                                 FACILITY:  [facility]   PHYSICIAN:  Charna Elizabeth, M.D.                   DATE OF BIRTH:  1951-05-05   DATE OF PROCEDURE:  04/08/2002  DATE OF DISCHARGE:                                 OPERATIVE REPORT   PROCEDURE PERFORMED:  Colonoscopy.   ENDOSCOPIST:  Charna Elizabeth, M.D.   INSTRUMENT USED:  Olympus video colonoscope.   INDICATIONS FOR PROCEDURE:  This is a 60 year old white male with a history  of Down's syndrome.  The patient has had a history of rectal bleeding.  Rule  out colonic polyps, masses, hemorrhoids, etc.   PRE-PROCEDURE PREPARATION:  Informed consent was secured from the patient.  The patient fasted for eight hours prior to the procedure and prepped with a  bottle of magnesium citrate and 1 gallon of  NuLytely the night prior to the  procedure.   PRE-PROCEDURE PHYSICAL EXAMINATION:  The patient had stable vital signs.  Neck was supple.  Chest was clear to auscultation.  S1 and S2 were regular.  Abdomen was soft with normal bowel sounds.   DESCRIPTION OF PROCEDURE:  The patient was placed in the left lateral  decubitus position and sedated with 20 mg of Demerol and 2 mg of Versed  intravenously.  Once the patient was adequately sedated, maintained on low-  flow oxygen and continuous cardiac monitoring, the Olympus video colonoscope  was advanced from the rectum to the cecum with difficulty.  There was a  large amount of residual stool in the colon.  Multiple washings were done.  No masses, polyps, erosions, ulcerations or diverticula were seen.  Small  internal hemorrhoids were appreciated on retroflexion in the rectum.  The  patient tolerated the procedure well without complications.   IMPRESSION:  Essentially unrevealing colonoscopy except for small, non-  bleeding internal  hemorrhoids.   RECOMMENDATIONS:  1. Colace 100 to 200 mg has been advised per night to help with softening of     stool.  2. A high-fiber diet with liberal fluid intake has been advocated.  3. Outpatient followup is advised in the next two weeks, earlier if need be.                                               Charna Elizabeth, M.D.    JM/MEDQ  D:  04/08/2002  T:  04/11/2002  Job:  [id number]   cc:   Juluis Mire, M.D.   Reather Littler, M.D.

## 2011-01-05 NOTE — Consult Note (Signed)
   NAME:  [name] [name], [name] [name] NO.:  [id number]   MEDICAL RECORD NO.:  [id number]                   PATIENT TYPE:  INP   LOCATION:  5028                                 FACILITY:  [facility]   PHYSICIAN:  Nani Gasser, M.D.            DATE OF BIRTH:  Jul 24, 1951   DATE OF CONSULTATION:  DATE OF DISCHARGE:  12/01/2002                                   CONSULTATION   DISCHARGE MEDICATIONS:  1. Augmentin 500 mg q.12h. times three days.  2. ________ p.o. daily times three days.  3. Lipitor 10 mg one p.o. daily.  4. Actos 25 mg one p.o. daily.  5. Amaryl 1 mg one p.o. daily.  6. Glucophage 1000 mg p.o. b.i.d.  7. Nasacort two squirts in each nostril daily.  8. Colace two tabs q.h.s.  9. Ensure one can with each meal.  10.      Albuterol MDI two puffs q.4h. p.r.n. for shortness of breath and     until recovers from his current pneumonia.   DISCHARGE DIAGNOSES:  1. Pneumonia.  2. Down's syndrome.  3. Diabetes mellitus type 2.  4. Conjunctivitis.   HOSPITAL COURSE:  The patient is a 60 year old.  Patient came in complaining  of shortness of breath times three days with a dry cough and some nasal  congestion.  Patient did have a low-grade temp on admission and, on chest x-  ray, had a right lower lobe infiltrate with some increased vascular  congestion and cardiomegaly.  An EKG showed normal sinus rhythm with no  ischemia, and patient was admitted for right lower lobe pneumonia.  The  following problems were addressed during admission:  1. Right lower lobe pneumonia.  Patient was started on azithromycin and     Rocephin and on scheduled Atrovent and albuterol nebs, also Guiafenesin     as an expectorant, and did very well.  2.     Diabetes mellitus.  Patient was covered with sliding scale while here but     discharged back on his Glucophage, Amaryl, and Actos.  We did check a     hemoglobin A1c on him; hemoglobin A1c was 6.5.  3. Conjunctivitis.  Patient  was started on Cipro and TobraDex drops, and     that was really most of his problems.                                               Nani Gasser, M.D.    CM/MEDQ  D:  12/31/2002  T:  01/01/2003  Job:  [id number]   cc:   Dr. Fran Lowes, [place]   Reather Littler, M.D.  [address], [address]  [place]  [place] [address]  Fax: [phone]

## 2013-04-13 ENCOUNTER — Other Ambulatory Visit: Payer: Self-pay | Admitting: *Deleted

## 2013-04-28 ENCOUNTER — Other Ambulatory Visit: Payer: Self-pay | Admitting: *Deleted

## 2013-04-28 DIAGNOSIS — E119 Type 2 diabetes mellitus without complications: Secondary | ICD-10-CM

## 2013-05-04 ENCOUNTER — Other Ambulatory Visit (INDEPENDENT_AMBULATORY_CARE_PROVIDER_SITE_OTHER): Payer: Medicare Other

## 2013-05-04 DIAGNOSIS — E119 Type 2 diabetes mellitus without complications: Secondary | ICD-10-CM

## 2013-05-04 LAB — COMPREHENSIVE METABOLIC PANEL
AST: 19 U/L (ref 0–37)
Albumin: 4 g/dL (ref 3.5–5.2)
BUN: 21 mg/dL (ref 6–23)
Calcium: 9.4 mg/dL (ref 8.4–10.5)
Chloride: 103 mEq/L (ref 96–112)
Glucose, Bld: 88 mg/dL (ref 70–99)
Potassium: 4.5 mEq/L (ref 3.5–5.1)

## 2013-05-04 LAB — URINALYSIS
Bilirubin Urine: NEGATIVE
Hgb urine dipstick: NEGATIVE
Ketones, ur: NEGATIVE
Urine Glucose: NEGATIVE
Urobilinogen, UA: 0.2 (ref 0.0–1.0)

## 2013-05-08 ENCOUNTER — Ambulatory Visit: Payer: Medicare Other | Admitting: Endocrinology

## 2013-05-13 ENCOUNTER — Encounter: Payer: Self-pay | Admitting: Endocrinology

## 2013-05-13 ENCOUNTER — Ambulatory Visit (INDEPENDENT_AMBULATORY_CARE_PROVIDER_SITE_OTHER): Payer: Medicare Other | Admitting: Endocrinology

## 2013-05-13 VITALS — BP 122/72 | HR 60 | Temp 98.4°F | Resp 12 | Ht 61.0 in | Wt 157.6 lb

## 2013-05-13 DIAGNOSIS — E7800 Pure hypercholesterolemia, unspecified: Secondary | ICD-10-CM

## 2013-05-13 DIAGNOSIS — E119 Type 2 diabetes mellitus without complications: Secondary | ICD-10-CM

## 2013-05-13 NOTE — Patient Instructions (Addendum)
Walk 3 times a week

## 2013-05-13 NOTE — Progress Notes (Signed)
Patient ID: [name] [name], male   DOB: 09/21/1950, 62 y.o.   MRN: [id number]  [name] [name] is an 62 y.o. male.   Reason for Appointment: Diabetes follow-up   History of Present Illness   Diagnosis: Type 2 DIABETES MELITUS, date of diagnosis:  ? 1988     Previous history: She was initially treated with metformin with fairly good control but subsequently needed additional medications for control. However with weight loss over the last couple of years his Amaryl had been tapered off   Recent history: he is now on Actoplusmet alone with good control. Actos was started back in place of Januvia when he was losing weight progressively and his weight has stabilized.     Oral hypoglycemic drugs: Actoplusmet      Side effects from medications: None       Monitors blood glucose: Once a day.    Glucometer:  unknown        Blood Glucose readings from meter download: readings before breakfast: 100-159, after supper 92-132 as an MAR. Not able to read glucose readings clearly  Hypoglycemia frequency:  none        Meals: 3 meals per day.          Physical activity: exercise: [facility]           Complications: are: None The last HbgA1c was reported as 5.8 and has been generally around 5.5-5.8 in the last couple of years  Wt Readings from Last 3 Encounters:  05/13/13 157 lb 9.6 oz (71.487 kg)  09/14/10 188 lb 8 oz (85.503 kg)    LABS:  No visits with results within 1 Week(s) from this visit. Latest known visit with results is:  Appointment on 05/04/2013  Component Date Value Range Status  . Hemoglobin A1C 05/04/2013 6.0  4.6 - 6.5 % Final   Glycemic Control Guidelines for People with Diabetes:Non Diabetic:  <6%Goal of Therapy: <7%Additional Action Suggested:  >8%   . Color, Urine 05/04/2013 LT. YELLOW  Yellow;Lt. Yellow Final  . APPearance 05/04/2013 CLEAR  Clear Final  . Specific Gravity, Urine 05/04/2013 >=1.030  1.000 - 1.030 Final  . pH 05/04/2013 5.5   5.0 - 8.0 Final  . Total Protein, Urine 05/04/2013 NEGATIVE  Negative Final  . Urine Glucose 05/04/2013 NEGATIVE  Negative Final  . Ketones, ur 05/04/2013 NEGATIVE  Negative Final  . Bilirubin Urine 05/04/2013 NEGATIVE  Negative Final  . Hgb urine dipstick 05/04/2013 NEGATIVE  Negative Final  . Urobilinogen, UA 05/04/2013 0.2  0.0 - 1.0 Final  . Leukocytes, UA 05/04/2013 NEGATIVE  Negative Final  . Nitrite 05/04/2013 NEGATIVE  Negative Final  . Microalb, Ur 05/04/2013 0.2  0.0 - 1.9 mg/dL Final  . Creatinine,U 82/95/6213 147.1   Final  . Microalb Creat Ratio 05/04/2013 0.1  0.0 - 30.0 mg/g Final  . Sodium 05/04/2013 138  135 - 145 mEq/L Final  . Potassium 05/04/2013 4.5  3.5 - 5.1 mEq/L Final  . Chloride 05/04/2013 103  96 - 112 mEq/L Final  . CO2 05/04/2013 30  19 - 32 mEq/L Final  . Glucose, Bld 05/04/2013 88  70 - 99 mg/dL Final  . BUN 08/65/7846 21  6 - 23 mg/dL Final  . Creatinine, Ser 05/04/2013 1.4  0.4 - 1.5 mg/dL Final  . Total Bilirubin 05/04/2013 1.1  0.3 - 1.2 mg/dL Final  . Alkaline Phosphatase 05/04/2013 43  39 - 117 U/L Final  .  AST 05/04/2013 19  0 - 37 U/L Final  . ALT 05/04/2013 11  0 - 53 U/L Final  . Total Protein 05/04/2013 6.8  6.0 - 8.3 g/dL Final  . Albumin 16/05/9603 4.0  3.5 - 5.2 g/dL Final  . Calcium 54/04/8118 9.4  8.4 - 10.5 mg/dL Final  . GFR 14/78/2956 56.79* >60.00 mL/min Final      Medication List       This list is accurate as of: 05/13/13 10:35 AM.  Always use your most recent med list.               acetaminophen 500 MG tablet  Commonly known as:  TYLENOL  Take 500 mg by mouth every 6 (six) hours as needed for pain.     allopurinol 100 MG tablet  Commonly known as:  ZYLOPRIM  100 mg.     aspirin 81 MG tablet  Take 81 mg by mouth daily.     clindamycin 1 % lotion  Commonly known as:  CLEOCIN T     fluticasone 50 MCG/ACT nasal spray  Commonly known as:  FLONASE     lactulose 10 GM/15ML solution  Commonly known as:  CHRONULAC      omeprazole 20 MG capsule  Commonly known as:  PRILOSEC     pioglitazone-metformin 15-850 MG per tablet  Commonly known as:  ACTOPLUS MET     pravastatin 20 MG tablet  Commonly known as:  PRAVACHOL     PROAIR HFA 108 (90 BASE) MCG/ACT inhaler  Generic drug:  albuterol     tamsulosin 0.4 MG Caps capsule  Commonly known as:  FLOMAX     traMADol 50 MG tablet  Commonly known as:  ULTRAM        Allergies: No Known Allergies  No past medical history on file.  No past surgical history on file.  No family history on file.  Social History:  reports that he has never smoked. He has never used smokeless tobacco. His alcohol and drug histories are not on file.  Review of Systems:  Hypertension:  not present His creatinine has been high normal, not sure why  Lipids: Lipids are well controlled with pravastatin, last LDL 88, last year was 113 off medication      Examination:   BP 122/72  Pulse 60  Temp(Src) 98.4 F (36.9 C)  Resp 12  Ht 5\' 1"  (1.549 m)  Wt 157 lb 9.6 oz (71.487 kg)  BMI 29.79 kg/m2  SpO2 98%  Body mass index is 29.79 kg/(m^2).   No pedal edema  ASSESSMENT/ PLAN::   Diabetes type 2   Blood glucose control is excellent with A1c 6%. Although he has relatively high fasting readings at home his lab glucose is quite good He has stabilized his weight with Actos instead of Januvia Encouraged him to walk more regularly  History of hypercholesterolemia, good control as of last visit   [name] 05/13/2013, 10:35 AM

## 2013-05-14 DIAGNOSIS — E7800 Pure hypercholesterolemia, unspecified: Secondary | ICD-10-CM | POA: Insufficient documentation

## 2013-07-27 ENCOUNTER — Other Ambulatory Visit (HOSPITAL_COMMUNITY): Payer: Self-pay | Admitting: Specialist

## 2013-07-27 DIAGNOSIS — R1310 Dysphagia, unspecified: Secondary | ICD-10-CM

## 2013-07-31 ENCOUNTER — Ambulatory Visit (HOSPITAL_COMMUNITY): Payer: Medicare Other

## 2013-07-31 ENCOUNTER — Other Ambulatory Visit (HOSPITAL_COMMUNITY): Payer: Medicare Other

## 2013-09-09 ENCOUNTER — Other Ambulatory Visit: Payer: Medicare Other

## 2013-09-09 DIAGNOSIS — E7800 Pure hypercholesterolemia, unspecified: Secondary | ICD-10-CM

## 2013-09-09 DIAGNOSIS — E119 Type 2 diabetes mellitus without complications: Secondary | ICD-10-CM

## 2013-09-14 ENCOUNTER — Ambulatory Visit: Payer: Medicare Other | Admitting: Endocrinology

## 2013-09-28 ENCOUNTER — Other Ambulatory Visit: Payer: Self-pay | Admitting: Endocrinology

## 2013-09-28 ENCOUNTER — Other Ambulatory Visit: Payer: Medicare Other

## 2013-09-28 LAB — LIPID PANEL
CHOL/HDL RATIO: 2.8 ratio
CHOLESTEROL: 146 mg/dL (ref 0–200)
HDL: 53 mg/dL (ref >39)
LDL CALC: 72 mg/dL (ref 0–99)
TRIGLYCERIDES: 103 mg/dL (ref <150)
VLDL: 21 mg/dL (ref 0–40)

## 2013-09-28 LAB — BASIC METABOLIC PANEL
BUN: 23 mg/dL (ref 6–23)
CO2: 28 mEq/L (ref 19–32)
Calcium: 9.6 mg/dL (ref 8.4–10.5)
Chloride: 108 mEq/L (ref 96–112)
Creat: 1.37 mg/dL — ABNORMAL HIGH (ref 0.50–1.35)
Glucose, Bld: 94 mg/dL (ref 70–99)
POTASSIUM: 4.7 meq/L (ref 3.5–5.3)
SODIUM: 146 meq/L — AB (ref 135–145)

## 2013-09-28 LAB — HEMOGLOBIN A1C
Hgb A1c MFr Bld: 5.6 % (ref <5.7)
Mean Plasma Glucose: 114 mg/dL (ref <117)

## 2013-09-30 ENCOUNTER — Ambulatory Visit (INDEPENDENT_AMBULATORY_CARE_PROVIDER_SITE_OTHER): Payer: Medicare Other | Admitting: Endocrinology

## 2013-09-30 ENCOUNTER — Encounter: Payer: Self-pay | Admitting: Endocrinology

## 2013-09-30 ENCOUNTER — Other Ambulatory Visit: Payer: Self-pay | Admitting: *Deleted

## 2013-09-30 VITALS — BP 102/68 | HR 66 | Temp 98.3°F | Resp 12 | Ht 61.0 in | Wt 151.6 lb

## 2013-09-30 DIAGNOSIS — E7800 Pure hypercholesterolemia, unspecified: Secondary | ICD-10-CM

## 2013-09-30 DIAGNOSIS — E119 Type 2 diabetes mellitus without complications: Secondary | ICD-10-CM

## 2013-09-30 DIAGNOSIS — N182 Chronic kidney disease, stage 2 (mild): Secondary | ICD-10-CM

## 2013-09-30 NOTE — Progress Notes (Signed)
Patient ID: [name] [name], male   DOB: 02/10/1951, 63 y.o.   MRN: [id number]   Reason for Appointment: Diabetes follow-up   History of Present Illness   Diagnosis: Type 2 DIABETES MELITUS, date of diagnosis:  ? 1988     Previous history: He was initially treated with metformin with fairly good control but subsequently needed additional medications including Actos for control. However with weight loss over the last couple of years his Amaryl was tapered off  His A1c has usually been upper normal consistently  Recent history: he is now on Actoplusmet alone with good control. Actos was started back in place of Januvia when he was losing weight progressively  However his weight has gone down again for unknown reasons. Difficult to get a history from him    Oral hypoglycemic drugs: Actoplusmet once a day      Side effects from medications: None       Monitors blood glucose: Once a day.    Glucometer:  generic        Blood Glucose readings from meter review: 77-155   Hypoglycemia frequency:  none        Meals: 3 meals per day.          Physical activity: exercise: [facility]        Complications: are: None   Wt Readings from Last 3 Encounters:  09/30/13 151 lb 9.6 oz (68.765 kg)  05/13/13 157 lb 9.6 oz (71.487 kg)  09/14/10 188 lb 8 oz (85.503 kg)    LABS:  Lab Results  Component Value Date   HGBA1C 5.6 09/28/2013   HGBA1C 6.0 05/04/2013   HGBA1C  Value: 5.8 (NOTE)                                                                       According to the ADA Clinical Practice Recommendations for 2011, when HbA1c is used as a screening test:   >=6.5%   Diagnostic of Diabetes Mellitus           (if abnormal result  is confirmed)  5.7-6.4%   Increased risk of developing Diabetes Mellitus  References:Diagnosis and Classification of Diabetes Mellitus,Diabetes SNKN,3976,73(ALPFX 1):S62-S69 and Standards of Medical Care in         Diabetes - 2011,Diabetes Care,2011,34   (Suppl 1):S11-S61.* 10/10/2010   Lab Results  Component Value Date   MICROALBUR 0.2 05/04/2013   LDLCALC 72 09/28/2013   CREATININE 1.37* 09/28/2013     Orders Only on 09/28/2013  Component Date Value Ref Range Status  . Sodium 09/28/2013 146* 135 - 145 mEq/L Final  . Potassium 09/28/2013 4.7  3.5 - 5.3 mEq/L Final  . Chloride 09/28/2013 108  96 - 112 mEq/L Final  . CO2 09/28/2013 28  19 - 32 mEq/L Final  . Glucose, Bld 09/28/2013 94  70 - 99 mg/dL Final  . BUN 09/28/2013 23  6 - 23 mg/dL Final  . Creat 09/28/2013 1.37* 0.50 - 1.35 mg/dL Final  . Calcium 09/28/2013 9.6  8.4 - 10.5 mg/dL Final  . Cholesterol 09/28/2013 146  0 - 200 mg/dL Final   Comment: ATP III Classification:                                <  200        mg/dL        Desirable                               200 - 239     mg/dL        Borderline High                               >= 240        mg/dL        High                             . Triglycerides 09/28/2013 103  <150 mg/dL Final  . HDL 09/28/2013 53  >39 mg/dL Final  . Total CHOL/HDL Ratio 09/28/2013 2.8   Final  . VLDL 09/28/2013 21  0 - 40 mg/dL Final  . LDL Cholesterol 09/28/2013 72  0 - 99 mg/dL Final   Comment:                            Total Cholesterol/HDL Ratio:CHD Risk                                                 Coronary Heart Disease Risk Table                                                                 Men       Women                                   1/2 Average Risk              3.4        3.3                                       Average Risk              5.0        4.4                                    2X Average Risk              9.6        7.1                                    3X Average Risk             23.4       11.0  Use the calculated Patient Ratio above and the CHD Risk table                           to determine the patient's CHD Risk.                          ATP III Classification (LDL):                                 < 100        mg/dL         Optimal                               100 - 129     mg/dL         Near or Above Optimal                               130 - 159     mg/dL         Borderline High                               160 - 189     mg/dL         High                                > 190        mg/dL         Very High                             . Hemoglobin A1C 09/28/2013 5.6  <5.7 % Final   Comment:                                                                                                 According to the ADA Clinical Practice Recommendations for 2011, when                          HbA1c is used as a screening test:                                                       >=6.5%   Diagnostic of Diabetes Mellitus                                     (if abnormal result is confirmed)  5.7-6.4%   Increased risk of developing Diabetes Mellitus                                                     References:Diagnosis and Classification of Diabetes Mellitus,Diabetes                          VWUJ,8119,14(NWGNF 1):S62-S69 and Standards of Medical Care in                                  Diabetes - 2011,Diabetes AOZH,0865,78 (Suppl 1):S11-S61.                             . Mean Plasma Glucose 09/28/2013 114  <117 mg/dL Final      Medication List       This list is accurate as of: 09/30/13 10:57 AM.  Always use your most recent med list.               acetaminophen 500 MG tablet  Commonly known as:  TYLENOL  Take 500 mg by mouth every 6 (six) hours as needed for pain.     allopurinol 100 MG tablet  Commonly known as:  ZYLOPRIM  100 mg.     aspirin 81 MG tablet  Take 81 mg by mouth daily.     clindamycin 1 % lotion  Commonly known as:  CLEOCIN T     donepezil 5 MG tablet  Commonly known as:  ARICEPT     fluticasone 50 MCG/ACT nasal spray  Commonly known as:  FLONASE     lactulose 10 GM/15ML solution   Commonly known as:  CHRONULAC     omeprazole 20 MG capsule  Commonly known as:  PRILOSEC     pioglitazone-metformin 15-850 MG per tablet  Commonly known as:  ACTOPLUS MET     pravastatin 20 MG tablet  Commonly known as:  PRAVACHOL     PROAIR HFA 108 (90 BASE) MCG/ACT inhaler  Generic drug:  albuterol     tamsulosin 0.4 MG Caps capsule  Commonly known as:  FLOMAX     traMADol 50 MG tablet  Commonly known as:  ULTRAM        Allergies: No Known Allergies  No past medical history on file.  No past surgical history on file.  No family history on file.  Social History:  reports that he has never smoked. He has never used smokeless tobacco. His alcohol and drug histories are not on file.  Review of Systems:  Hypertension:  not present, taking low-dose losartan from PCP His creatinine has been high normal, not sure why  Lipids: Lipids are well controlled with pravastatin  Lab Results  Component Value Date   CHOL 146 09/28/2013   HDL 53 09/28/2013   LDLCALC 72 09/28/2013   TRIG 103 09/28/2013   CHOLHDL 2.8 09/28/2013       Examination:   BP 102/68  Pulse 66  Temp(Src) 98.3 F (36.8 C)  Resp 12  Ht 5' 1"  (1.549 m)  Wt 151 lb 9.6 oz (68.765 kg)  BMI 28.66 kg/m2  SpO2 96%  Body mass index is 28.66 kg/(m^2).   No lower leg  edema  ASSESSMENT/ PLAN::   Diabetes type 2   Blood glucose control is excellent with A1c again in the normal range with low-dose Actos and metformin only He is also losing weight for unknown reasons Currently using a generic monitor and difficult to know this is accurate. Also currently not exercising  Recommendations made today:  Continue same dose of Actoplusmet  Using Accu-Chek monitor and check at various times including after supper  Start regular walking or exercise at the [facility]  Followup with primary care physician for borderline renal function  Mildly abnormal renal function: Etiology unclear. Probably does not need losartan as  his diabetes has been consistently mild and it may be affecting his creatinine   Jamiyla Ishee 09/30/2013, 10:57 AM

## 2013-09-30 NOTE — Patient Instructions (Signed)
Walk 3x per week  Please check blood sugars at least half the time about 2 hours after any meal and as directed on waking up. Please bring blood sugar monitor to each visit

## 2013-10-09 ENCOUNTER — Ambulatory Visit (INDEPENDENT_AMBULATORY_CARE_PROVIDER_SITE_OTHER): Payer: Medicare Other | Admitting: Family Medicine

## 2013-10-09 ENCOUNTER — Ambulatory Visit: Payer: Medicare Other

## 2013-10-09 VITALS — BP 100/60 | HR 57 | Temp 97.4°F | Resp 18 | Ht 61.0 in | Wt 154.0 lb

## 2013-10-09 DIAGNOSIS — R059 Cough, unspecified: Secondary | ICD-10-CM

## 2013-10-09 DIAGNOSIS — R05 Cough: Secondary | ICD-10-CM

## 2013-10-09 DIAGNOSIS — E119 Type 2 diabetes mellitus without complications: Secondary | ICD-10-CM

## 2013-10-09 DIAGNOSIS — Q909 Down syndrome, unspecified: Secondary | ICD-10-CM

## 2013-10-09 DIAGNOSIS — J189 Pneumonia, unspecified organism: Secondary | ICD-10-CM

## 2013-10-09 DIAGNOSIS — R0989 Other specified symptoms and signs involving the circulatory and respiratory systems: Secondary | ICD-10-CM

## 2013-10-09 LAB — POCT CBC
Granulocyte percent: 57.2 %G (ref 37–80)
HEMATOCRIT: 39.7 % — AB (ref 43.5–53.7)
Hemoglobin: 12.1 g/dL — AB (ref 14.1–18.1)
Lymph, poc: 1.1 (ref 0.6–3.4)
MCH, POC: 31.9 pg — AB (ref 27–31.2)
MCHC: 30.5 g/dL — AB (ref 31.8–35.4)
MCV: 104.8 fL — AB (ref 80–97)
MID (cbc): 0.4 (ref 0–0.9)
MPV: 96 fL (ref 0–99.8)
POC Granulocyte: 1.9 — AB (ref 2–6.9)
POC LYMPH PERCENT: 32 %L (ref 10–50)
POC MID %: 10.8 %M (ref 0–12)
Platelet Count, POC: 142 10*3/uL (ref 142–424)
RBC: 3.79 M/uL — AB (ref 4.69–6.13)
RDW, POC: 15.4 %
WBC: 3.4 10*3/uL — AB (ref 4.6–10.2)

## 2013-10-09 LAB — GLUCOSE, POCT (MANUAL RESULT ENTRY): POC Glucose: 82 mg/dl (ref 70–99)

## 2013-10-09 MED ORDER — LEVOFLOXACIN 500 MG PO TABS: 500.0000 mg | ORAL_TABLET | Freq: Every day | ORAL | Status: DC

## 2013-10-09 MED ORDER — HYDROCODONE-HOMATROPINE 5-1.5 MG/5ML PO SYRP: 5.0000 mL | ORAL_SOLUTION | ORAL | Status: DC | PRN

## 2013-10-09 NOTE — Progress Notes (Signed)
Subjective: 63 year old man who has been ill with a respiratory tract infection since about a week ago, symptoms over the weekend. He has had congestion and cough. Has not been running a fever. He has a history of Down syndrome and lives in a group home. Generally does well. He is diabetic and on medications for that. Sugars have been good control.   Objective: 63 year old man appears elderly, with Down syndrome. His TMs are both difficult to visualize due to narrow canals and some cerumen. His throat is clear.. Fissure tongue. Neck supple without nodes. Chest has diffuse rhonchi, very pronounced on the left and less so on the right. Heart regular without obvious murmur. Sounds partially muffled by the lungs. Abdomen soft  Assessment: Rhonchi Cough Down syndrome Diabetes Macrocytic anemia   Plan: Chest x-ray, CBC, glucose  Results for orders placed in visit on 10/09/13  POCT CBC      Result Value Ref Range   WBC 3.4 (*) 4.6 - 10.2 K/uL   Lymph, poc 1.1  0.6 - 3.4   POC LYMPH PERCENT 32.0  10 - 50 %L   MID (cbc) 0.4  0 - 0.9   POC MID % 10.8  0 - 12 %M   POC Granulocyte 1.9 (*) 2 - 6.9   Granulocyte percent 57.2  37 - 80 %G   RBC 3.79 (*) 4.69 - 6.13 M/uL   Hemoglobin 12.1 (*) 14.1 - 18.1 g/dL   HCT, POC 16.139.7 (*) 09.643.5 - 53.7 %   MCV 104.8 (*) 80 - 97 fL   MCH, POC 31.9 (*) 27 - 31.2 pg   MCHC 30.5 (*) 31.8 - 35.4 g/dL   RDW, POC 04.515.4     Platelet Count, POC 142  142 - 424 K/uL   MPV 96  0 - 99.8 fL  GLUCOSE, POCT (MANUAL RESULT ENTRY)      Result Value Ref Range   POC Glucose 82  70 - 99 mg/dl   [facility] reading (PRIMARY) by  Dr. Alwyn RenHopper Probable rll pneumonia and left lower lobe pneumonia  Plan: Treat with Levaquin. It may be a viral process with the low white count. Advise him to return in 48-72 hours, sooner if worse, for recheck. Probably should have a Repeat chest x-ray at that time because of all the bad rhonchi he has. Individuals with Down syndrome sometimes did not not  as good a WBC response, and their WBCs may not work quite as well. .Marland Kitchen

## 2013-10-09 NOTE — Patient Instructions (Signed)
Drink plenty of fluids  Take the cough syrup 1 teaspoon every 4-6 hours if needed for bad cough  Take the antibiotic one pill daily, beginning tonight at supper time and then continuing one every day at noon  Return or go to the emergency room if worse  Return on Sunday or Monday for recheck

## 2013-10-12 ENCOUNTER — Ambulatory Visit (INDEPENDENT_AMBULATORY_CARE_PROVIDER_SITE_OTHER): Payer: Medicare Other | Admitting: Family Medicine

## 2013-10-12 VITALS — BP 97/65 | HR 61 | Temp 97.9°F | Resp 16 | Ht 59.0 in | Wt 152.2 lb

## 2013-10-12 DIAGNOSIS — R0989 Other specified symptoms and signs involving the circulatory and respiratory systems: Secondary | ICD-10-CM

## 2013-10-12 DIAGNOSIS — J181 Lobar pneumonia, unspecified organism: Principal | ICD-10-CM

## 2013-10-12 DIAGNOSIS — J189 Pneumonia, unspecified organism: Secondary | ICD-10-CM

## 2013-10-12 NOTE — Progress Notes (Signed)
Subjective: Patient is here for a followup from his visit of Friday. He is doing a little better. He still wheezing some but not as congested and coughing. Is not running a fever. He says he feels a little better.  Objective: Throat is clear. Chest has rhonchi especially on the left. He is not as tight as he was the other day. Right lung is fairly clear. Heart regular. The chest x-ray was interpreted as more old scarring than pneumonia. Clinically he had some left lower lobe pneumonia.  Assessment: Left lower lobe pneumonia, improving Pulmonary atelectasis and scarring  Plan: Albuterol inhaler asor chamber ( he has one) Continue antibiotics and cough syrup as needed  Return if worse

## 2013-10-12 NOTE — Progress Notes (Signed)
   Subjective:    Patient ID: [name] [name], male    DOB: 13-Dec-1950, 63 y.o.   MRN: [id number]  HPI  63 y.o. Male presents to clinic for follow up on pneumonia. Per care aide patients cough has calmed down. Is still having wheezing. Cough is non-productive. Denies any trouble with breathing.  Review of Systems     Objective:   Physical Exam        Assessment & Plan:

## 2013-10-12 NOTE — Patient Instructions (Signed)
Continue to try to get plenty of fluids  Use the inhaler 4 times daily at meals and at bedtime. He can use it in the middle of the night if he wakes up short of breath.  Continue antibiotics  Return if worse  If cough is not much better by Friday or Saturday return for a followup chest x-ray.

## 2013-12-15 ENCOUNTER — Telehealth: Payer: Self-pay | Admitting: Endocrinology

## 2013-12-15 NOTE — Telephone Encounter (Signed)
Nurse called in regard to Patient receiving the Pneumovax Injection ?  Thank You

## 2013-12-15 NOTE — Telephone Encounter (Signed)
Nurse wanted to see if he had received the pneumovax before, he has not.

## 2014-01-25 ENCOUNTER — Other Ambulatory Visit (INDEPENDENT_AMBULATORY_CARE_PROVIDER_SITE_OTHER): Payer: Medicare Other

## 2014-01-25 DIAGNOSIS — E119 Type 2 diabetes mellitus without complications: Secondary | ICD-10-CM

## 2014-01-25 LAB — COMPREHENSIVE METABOLIC PANEL
ALT: 14 U/L (ref 0–53)
AST: 21 U/L (ref 0–37)
Albumin: 3.7 g/dL (ref 3.5–5.2)
Alkaline Phosphatase: 35 U/L — ABNORMAL LOW (ref 39–117)
BUN: 21 mg/dL (ref 6–23)
CHLORIDE: 106 meq/L (ref 96–112)
CO2: 29 meq/L (ref 19–32)
Calcium: 9.3 mg/dL (ref 8.4–10.5)
Creatinine, Ser: 1.3 mg/dL (ref 0.4–1.5)
GFR: 61.3 mL/min (ref >60.00)
Glucose, Bld: 88 mg/dL (ref 70–99)
Potassium: 4.3 mEq/L (ref 3.5–5.1)
SODIUM: 140 meq/L (ref 135–145)
TOTAL PROTEIN: 6.2 g/dL (ref 6.0–8.3)
Total Bilirubin: 1 mg/dL (ref 0.2–1.2)

## 2014-01-25 LAB — HEMOGLOBIN A1C: HEMOGLOBIN A1C: 5.7 % (ref 4.6–6.5)

## 2014-01-27 ENCOUNTER — Ambulatory Visit: Payer: Medicare Other | Admitting: Endocrinology

## 2014-02-08 ENCOUNTER — Ambulatory Visit: Payer: Medicare Other | Admitting: Endocrinology

## 2014-02-09 LAB — HM DIABETES EYE EXAM

## 2014-03-30 ENCOUNTER — Encounter: Payer: Self-pay | Admitting: Endocrinology

## 2014-03-30 ENCOUNTER — Ambulatory Visit (INDEPENDENT_AMBULATORY_CARE_PROVIDER_SITE_OTHER): Payer: Medicare Other | Admitting: Endocrinology

## 2014-03-30 VITALS — BP 114/72 | HR 53 | Temp 98.0°F | Resp 16 | Ht 61.0 in | Wt 152.4 lb

## 2014-03-30 DIAGNOSIS — Q909 Down syndrome, unspecified: Secondary | ICD-10-CM

## 2014-03-30 DIAGNOSIS — E119 Type 2 diabetes mellitus without complications: Secondary | ICD-10-CM

## 2014-03-30 NOTE — Patient Instructions (Signed)
Check sugar fasting on even days of month and at 8 pm on odd days  And send legible records  Labs:  No visits with results within 1 Month(s) from this visit. Latest known visit with results is:  Appointment on 01/25/2014  Component Date Value Ref Range Status  . Hemoglobin A1C 01/25/2014 5.7  4.6 - 6.5 % Final   Glycemic Control Guidelines for People with Diabetes:Non Diabetic:  <6%Goal of Therapy: <7%Additional Action Suggested:  >8%   . Sodium 01/25/2014 140  135 - 145 mEq/L Final  . Potassium 01/25/2014 4.3  3.5 - 5.1 mEq/L Final  . Chloride 01/25/2014 106  96 - 112 mEq/L Final  . CO2 01/25/2014 29  19 - 32 mEq/L Final  . Glucose, Bld 01/25/2014 88  70 - 99 mg/dL Final  . BUN 16/10/960406/03/2014 21  6 - 23 mg/dL Final  . Creatinine, Ser 01/25/2014 1.3  0.4 - 1.5 mg/dL Final  . Total Bilirubin 01/25/2014 1.0  0.2 - 1.2 mg/dL Final  . Alkaline Phosphatase 01/25/2014 35* 39 - 117 U/L Final  . AST 01/25/2014 21  0 - 37 U/L Final  . ALT 01/25/2014 14  0 - 53 U/L Final  . Total Protein 01/25/2014 6.2  6.0 - 8.3 g/dL Final  . Albumin 54/09/811906/03/2014 3.7  3.5 - 5.2 g/dL Final  . Calcium 14/78/295606/03/2014 9.3  8.4 - 10.5 mg/dL Final  . GFR 21/30/865706/03/2014 61.30  >60.00 mL/min Final

## 2014-03-30 NOTE — Progress Notes (Signed)
Patient ID: [name] [name], male   DOB: 05/14/1951, 63 y.o.   MRN: [id number]   Reason for Appointment: Diabetes follow-up   History of Present Illness   Diagnosis: Type 2 DIABETES MELITUS, date of diagnosis:  ? 1988     Previous history: He was initially treated with metformin with fairly good control but subsequently needed additional medications including Actos for control. However with weight loss over the last couple of years his Amaryl was tapered off  His A1c has usually been upper normal consistently  Recent history:  He is back in followup after 6 months interval He has been on Actoplusmet once a day alone with good control.  Actos was started back in place of Januvia when he was losing weight progressively in the past His weight is now stable  A1c is again excellent but this was done 2 months ago Not clear if his home readings are accurate as they are higher than expected. Reportedly fasting glucose was 268 today but otherwise mostly near normal Difficult to get a history from him    Oral hypoglycemic drugs: Actoplusmet once a day      Side effects from medications: None       Monitors blood glucose:  once or twice a day.    Glucometer:  unknown brand       Blood Glucose readings from [facility] review:  Mornings 87-142, after supper 99-170. The numbers are illegible   Hypoglycemia frequency:  none        Meals: 3 meals per day.          Physical activity: exercise: Not doing any recently, not motivated        Complications: are: None   Wt Readings from Last 3 Encounters:  03/30/14 152 lb 6.4 oz (69.128 kg)  10/12/13 152 lb 3.2 oz (69.037 kg)  10/09/13 154 lb (69.854 kg)    LABS:  Lab Results  Component Value Date   HGBA1C 5.7 01/25/2014   HGBA1C 5.6 09/28/2013   HGBA1C 6.0 05/04/2013   Lab Results  Component Value Date   MICROALBUR 0.2 05/04/2013   LDLCALC 72 09/28/2013   CREATININE 1.3 01/25/2014     No visits with results within 1 Week(s) from this visit. Latest known  visit with results is:  Appointment on 01/25/2014  Component Date Value Ref Range Status  . Hemoglobin A1C 01/25/2014 5.7  4.6 - 6.5 % Final   Glycemic Control Guidelines for People with Diabetes:Non Diabetic:  <6%Goal of Therapy: <7%Additional Action Suggested:  >8%   . Sodium 01/25/2014 140  135 - 145 mEq/L Final  . Potassium 01/25/2014 4.3  3.5 - 5.1 mEq/L Final  . Chloride 01/25/2014 106  96 - 112 mEq/L Final  . CO2 01/25/2014 29  19 - 32 mEq/L Final  . Glucose, Bld 01/25/2014 88  70 - 99 mg/dL Final  . BUN 01/25/2014 21  6 - 23 mg/dL Final  . Creatinine, Ser 01/25/2014 1.3  0.4 - 1.5 mg/dL Final  . Total Bilirubin 01/25/2014 1.0  0.2 - 1.2 mg/dL Final  . Alkaline Phosphatase 01/25/2014 35* 39 - 117 U/L Final  . AST 01/25/2014 21  0 - 37 U/L Final  . ALT 01/25/2014 14  0 - 53 U/L Final  . Total Protein 01/25/2014 6.2  6.0 - 8.3 g/dL Final  . Albumin 01/25/2014 3.7  3.5 - 5.2 g/dL Final  . Calcium 01/25/2014 9.3  8.4 - 10.5 mg/dL Final  . GFR 01/25/2014 61.30  >  60.00 mL/min Final      Medication List       This list is accurate as of: 03/30/14 11:27 AM.  Always use your most recent med list.               acetaminophen 500 MG tablet  Commonly known as:  TYLENOL  Take 500 mg by mouth every 6 (six) hours as needed for pain.     allopurinol 100 MG tablet  Commonly known as:  ZYLOPRIM  100 mg.     aspirin 81 MG tablet  Take 81 mg by mouth daily.     azithromycin 250 MG tablet  Commonly known as:  ZITHROMAX     CALCIUM + D PO  Take 1 tablet by mouth 2 (two) times daily.     clindamycin 1 % lotion  Commonly known as:  CLEOCIN T     cyanocobalamin 1000 MCG/ML injection  Commonly known as:  (VITAMIN B-12)  Inject 1,000 mcg into the muscle every 30 (thirty) days.     donepezil 10 MG tablet  Commonly known as:  ARICEPT  Take 10 mg by mouth at bedtime.     fluticasone 50 MCG/ACT nasal spray  Commonly known as:  FLONASE     HYDROcodone-homatropine 5-1.5 MG/5ML  syrup  Commonly known as:  HYCODAN  Take 5 mLs by mouth every 4 (four) hours as needed for cough.     lactulose 10 GM/15ML solution  Commonly known as:  CHRONULAC     losartan 25 MG tablet  Commonly known as:  COZAAR  Take 25 mg by mouth daily. Takes 1/2 tablet daily     mineral oil-hydrophilic petrolatum ointment  Apply 1 application topically 2 (two) times daily.     omeprazole 20 MG capsule  Commonly known as:  PRILOSEC     oxybutynin 5 MG tablet  Commonly known as:  DITROPAN     pioglitazone-metformin 15-850 MG per tablet  Commonly known as:  ACTOPLUS MET     pravastatin 20 MG tablet  Commonly known as:  PRAVACHOL     albuterol (2.5 MG/3ML) 0.083% nebulizer solution  Commonly known as:  PROVENTIL  Take 2.5 mg by nebulization 2 (two) times daily as needed for wheezing or shortness of breath.     PROAIR HFA 108 (90 BASE) MCG/ACT inhaler  Generic drug:  albuterol     tamsulosin 0.4 MG Caps capsule  Commonly known as:  FLOMAX        Allergies: No Known Allergies  Past Medical History  Diagnosis Date  . Down syndrome   . Diabetes mellitus without complication     No past surgical history on file.  No family history on file.  Social History:  reports that he has never smoked. He has never used smokeless tobacco. His alcohol and drug histories are not on file.  Review of Systems:  Hypertension:  not present, taking low-dose losartan prescribed by PCP His creatinine has been high normal but stable  Lipids: Lipids are well controlled with pravastatin  Lab Results  Component Value Date   CHOL 146 09/28/2013   HDL 53 09/28/2013   LDLCALC 72 09/28/2013   TRIG 103 09/28/2013   CHOLHDL 2.8 09/28/2013       Examination:   BP 114/72  Pulse 53  Temp(Src) 98 F (36.7 C)  Resp 16  Ht _0  (1.549 m)  Wt 152 lb 6.4 oz (69.128 kg)  BMI 28.81 kg/m2  SpO2 95%  Body  mass index is 28.81 kg/(m^2).   Thyroid not enlarged  Diabetic foot exam shows probable  monofilament sensation in the toes and plantar surfaces, no skin lesions or ulcers on the feet and normal pedal pulses. He is unreliable with his responses for sensory testing   ASSESSMENT/ PLAN:   Diabetes type 2   Blood glucose control is excellent with A1c again in the normal range with low-dose Actos and metformin only His home glucose monitor is probably somewhat inaccurate, lab glucose was 88 His weight is stable but currently not exercising He will continue the same regimen and followup in 4 months  History of mild hyperlipidemia and well controlled as of 2/15   [facility] 03/30/2014, 11:27 AM

## 2014-07-06 ENCOUNTER — Other Ambulatory Visit: Payer: Medicare Other

## 2014-07-08 ENCOUNTER — Other Ambulatory Visit: Payer: Medicare Other

## 2014-07-12 ENCOUNTER — Ambulatory Visit: Payer: Medicare Other | Admitting: Endocrinology

## 2014-07-12 ENCOUNTER — Emergency Department (HOSPITAL_BASED_OUTPATIENT_CLINIC_OR_DEPARTMENT_OTHER)
Admission: EM | Admit: 2014-07-12 | Discharge: 2014-07-12 | Disposition: A | Discharge status: Home / Self Care | Payer: Medicare Other | Attending: Emergency Medicine | Admitting: Emergency Medicine

## 2014-07-12 ENCOUNTER — Encounter (HOSPITAL_BASED_OUTPATIENT_CLINIC_OR_DEPARTMENT_OTHER): Payer: Self-pay | Admitting: *Deleted

## 2014-07-12 ENCOUNTER — Emergency Department (HOSPITAL_BASED_OUTPATIENT_CLINIC_OR_DEPARTMENT_OTHER): Payer: Medicare Other

## 2014-07-12 DIAGNOSIS — M1711 Unilateral primary osteoarthritis, right knee: Secondary | ICD-10-CM

## 2014-07-12 DIAGNOSIS — Z7982 Long term (current) use of aspirin: Secondary | ICD-10-CM | POA: Insufficient documentation

## 2014-07-12 DIAGNOSIS — Z7951 Long term (current) use of inhaled steroids: Secondary | ICD-10-CM | POA: Insufficient documentation

## 2014-07-12 DIAGNOSIS — Z79899 Other long term (current) drug therapy: Secondary | ICD-10-CM | POA: Insufficient documentation

## 2014-07-12 DIAGNOSIS — M25561 Pain in right knee: Secondary | ICD-10-CM | POA: Diagnosis present

## 2014-07-12 DIAGNOSIS — M179 Osteoarthritis of knee, unspecified: Secondary | ICD-10-CM | POA: Diagnosis not present

## 2014-07-12 DIAGNOSIS — Q909 Down syndrome, unspecified: Secondary | ICD-10-CM | POA: Diagnosis not present

## 2014-07-12 DIAGNOSIS — E119 Type 2 diabetes mellitus without complications: Secondary | ICD-10-CM | POA: Diagnosis not present

## 2014-07-12 MED ORDER — IBUPROFEN 400 MG PO TABS: 400.0000 mg | ORAL_TABLET | Freq: Three times a day (TID) | ORAL | Status: DC | PRN

## 2014-07-12 MED ORDER — ACETAMINOPHEN 325 MG PO TABS
650.0000 mg | ORAL_TABLET | Freq: Once | ORAL | Status: AC
Start: 2014-07-12 — End: 2014-07-12
  Administered 2014-07-12: 650 mg via ORAL
  Filled 2014-07-12: qty 2

## 2014-07-12 NOTE — Discharge Instructions (Signed)
Please follow up with your primary care physician in 1-2 days. If you do not have one please [facility] listed above. Please follow up with Dr. Pearletha ForgeHudnall, the sports medicine doctor to schedule a follow up appointment. Please read all discharge instructions and return precautions.   Knee Pain The knee is the complex joint between your thigh and your lower leg. It is made up of bones, tendons, ligaments, and cartilage. The bones that make up the knee are:  The femur in the thigh.  The tibia and fibula in the lower leg.  The patella or kneecap riding in the groove on the lower femur. CAUSES  Knee pain is a common complaint with many causes. A few of these causes are:  Injury, such as:  A ruptured ligament or tendon injury.  Torn cartilage.  Medical conditions, such as:  Gout  Arthritis  Infections  Overuse, over training, or overdoing a physical activity. Knee pain can be minor or severe. Knee pain can accompany debilitating injury. Minor knee problems often respond well to self-care measures or get well on their own. More serious injuries may need medical intervention or even surgery. SYMPTOMS The knee is complex. Symptoms of knee problems can vary widely. Some of the problems are:  Pain with movement and weight bearing.  Swelling and tenderness.  Buckling of the knee.  Inability to straighten or extend your knee.  Your knee locks and you cannot straighten it.  Warmth and redness with pain and fever.  Deformity or dislocation of the kneecap. DIAGNOSIS  Determining what is wrong may be very straight forward such as when there is an injury. It can also be challenging because of the complexity of the knee. Tests to make a diagnosis may include:  Your caregiver taking a history and doing a physical exam.  Routine X-rays can be used to rule out other problems. X-rays will not reveal a cartilage tear. Some injuries of the knee can be  diagnosed by:  Arthroscopy a surgical technique by which a small video camera is inserted through tiny incisions on the sides of the knee. This procedure is used to examine and repair internal knee joint problems. Tiny instruments can be used during arthroscopy to repair the torn knee cartilage (meniscus).  Arthrography is a radiology technique. A contrast liquid is directly injected into the knee joint. Internal structures of the knee joint then become visible on X-ray film.  An MRI scan is a non X-ray radiology procedure in which magnetic fields and a computer produce two- or three-dimensional images of the inside of the knee. Cartilage tears are often visible using an MRI scanner. MRI scans have largely replaced arthrography in diagnosing cartilage tears of the knee.  Blood work.  Examination of the fluid that helps to lubricate the knee joint (synovial fluid). This is done by taking a sample out using a needle and a syringe. TREATMENT The treatment of knee problems depends on the cause. Some of these treatments are:  Depending on the injury, proper casting, splinting, surgery, or physical therapy care will be needed.  Give yourself adequate recovery time. Do not overuse your joints. If you begin to get sore during workout routines, back off. Slow down or do fewer repetitions.  For repetitive activities such as cycling or running, maintain your strength and nutrition.  Alternate muscle groups. For example, if you are a weight lifter, work the upper body on one day and the lower body the next.  Either tight or weak muscles do not give the proper support for your knee. Tight or weak muscles do not absorb the stress placed on the knee joint. Keep the muscles surrounding the knee strong.  Take care of mechanical problems.  If you have flat feet, orthotics or special shoes may help. See your caregiver if you need help.  Arch supports, sometimes with wedges on the inner or outer aspect of  the heel, can help. These can shift pressure away from the side of the knee most bothered by osteoarthritis.  A brace called an "unloader" brace also may be used to help ease the pressure on the most arthritic side of the knee.  If your caregiver has prescribed crutches, braces, wraps or ice, use as directed. The acronym for this is PRICE. This means protection, rest, ice, compression, and elevation.  Nonsteroidal anti-inflammatory drugs (NSAIDs), can help relieve pain. But if taken immediately after an injury, they may actually increase swelling. Take NSAIDs with food in your stomach. Stop them if you develop stomach problems. Do not take these if you have a history of ulcers, stomach pain, or bleeding from the bowel. Do not take without your caregiver's approval if you have problems with fluid retention, heart failure, or kidney problems.  For ongoing knee problems, physical therapy may be helpful.  Glucosamine and chondroitin are over-the-counter dietary supplements. Both may help relieve the pain of osteoarthritis in the knee. These medicines are different from the usual anti-inflammatory drugs. Glucosamine may decrease the rate of cartilage destruction.  Injections of a corticosteroid drug into your knee joint may help reduce the symptoms of an arthritis flare-up. They may provide pain relief that lasts a few months. You may have to wait a few months between injections. The injections do have a small increased risk of infection, water retention, and elevated blood sugar levels.  Hyaluronic acid injected into damaged joints may ease pain and provide lubrication. These injections may work by reducing inflammation. A series of shots may give relief for as long as 6 months.  Topical painkillers. Applying certain ointments to your skin may help relieve the pain and stiffness of osteoarthritis. Ask your pharmacist for suggestions. Many over the-counter products are approved for temporary relief of  arthritis pain.  In some countries, doctors often prescribe topical NSAIDs for relief of chronic conditions such as arthritis and tendinitis. A review of treatment with NSAID creams found that they worked as well as oral medications but without the serious side effects. PREVENTION  Maintain a healthy weight. Extra pounds put more strain on your joints.  Get strong, stay limber. Weak muscles are a common cause of knee injuries. Stretching is important. Include flexibility exercises in your workouts.  Be smart about exercise. If you have osteoarthritis, chronic knee pain or recurring injuries, you may need to change the way you exercise. This does not mean you have to stop being active. If your knees ache after jogging or playing basketball, consider switching to swimming, water aerobics, or other low-impact activities, at least for a few days a week. Sometimes limiting high-impact activities will provide relief.  Make sure your shoes fit well. Choose footwear that is right for your sport.  Protect your knees. Use the proper gear for knee-sensitive activities. Use kneepads when playing volleyball or laying carpet. Buckle your seat belt every time you drive. Most shattered kneecaps occur in car accidents.  Rest when you are tired. SEEK MEDICAL CARE IF:  You have knee pain that is  continual and does not seem to be getting better.  SEEK IMMEDIATE MEDICAL CARE IF:  Your knee joint feels hot to the touch and you have a high fever. MAKE SURE YOU:   Understand these instructions.  Will watch your condition.  Will get help right away if you are not doing well or get worse. Document Released: 06/03/2007 Document Revised: 10/29/2011 Document Reviewed: 06/03/2007 [facility] Patient Information 2015 [place], [place]. This information is not intended to replace advice given to you by your health care provider. Make sure you discuss any questions you have with your health care provider.  RICE: Routine  Care for Injuries The routine care of many injuries includes Rest, Ice, Compression, and Elevation (RICE). HOME CARE INSTRUCTIONS  Rest is needed to allow your body to heal. Routine activities can usually be resumed when comfortable. Injured tendons and bones can take up to 6 weeks to heal. Tendons are the cord-like structures that attach muscle to bone.  Ice following an injury helps keep the swelling down and reduces pain.  Put ice in a plastic bag.  Place a towel between your skin and the bag.  Leave the ice on for 15-20 minutes, 3-4 times a day, or as directed by your health care provider. Do this while awake, for the first 24 to 48 hours. After that, continue as directed by your caregiver.  Compression helps keep swelling down. It also gives support and helps with discomfort. If an elastic bandage has been applied, it should be removed and reapplied every 3 to 4 hours. It should not be applied tightly, but firmly enough to keep swelling down. Watch fingers or toes for swelling, bluish discoloration, coldness, numbness, or excessive pain. If any of these problems occur, remove the bandage and reapply loosely. Contact your caregiver if these problems continue.  Elevation helps reduce swelling and decreases pain. With extremities, such as the arms, hands, legs, and feet, the injured area should be placed near or above the level of the heart, if possible. SEEK IMMEDIATE MEDICAL CARE IF:  You have persistent pain and swelling.  You develop redness, numbness, or unexpected weakness.  Your symptoms are getting worse rather than improving after several days. These symptoms may indicate that further evaluation or further X-rays are needed. Sometimes, X-rays may not show a small broken bone (fracture) until 1 week or 10 days later. Make a follow-up appointment with your caregiver. Ask when your X-ray results will be ready. Make sure you get your X-ray results. Document Released: 11/18/2000  Document Revised: 08/11/2013 Document Reviewed: 01/05/2011 [employer] [facility] [employer] [facility] [employer] Patient Information 2015 [place], [place]. This information is not intended to replace advice given to you by your health care provider. Make sure you discuss any questions you have with your health care provider.

## 2014-07-12 NOTE — ED Notes (Signed)
C.o pain in his right knee since this am. He lives in a group home. No known injury.

## 2014-07-12 NOTE — ED Provider Notes (Signed)
CSN: [id number]     Arrival date & time 07/12/14  1913 History   First MD Initiated Contact with Patient 07/12/14 1919     Chief Complaint  Patient presents with  . Knee Pain     (Consider location/radiation/quality/duration/timing/severity/associated sxs/prior Treatment) HPI Comments: Patient is a 63 yo M PMHx significant for Down's Syndrome, DM presenting to the ED from a group home for complaint of right knee pain. Patient states his knee pain began today, but states he fell yesterday afternoon while he was out onto concrete. Denies hitting his head or any LOC. He is complaining of mild non-radiating anterior knee pain. No aggravating factors. No alleviating factors. Medications tried prior to arrival: Tylenol. Denies fevers, chills.     Past Medical History  Diagnosis Date  . Down syndrome   . Diabetes mellitus without complication    History reviewed. No pertinent past surgical history. No family history on file. History  Substance Use Topics  . Smoking status: Never Smoker   . Smokeless tobacco: Never Used  . Alcohol Use: Not on file    Review of Systems  Musculoskeletal: Positive for myalgias and arthralgias.  All other systems reviewed and are negative.     Allergies  Review of patient's allergies indicates no known allergies.  Home Medications   Prior to Admission medications   Medication Sig Start Date End Date Taking? Authorizing Provider  acetaminophen (TYLENOL) 500 MG tablet Take 500 mg by mouth every 6 (six) hours as needed for pain.    Historical Provider, MD  albuterol (PROVENTIL) (2.5 MG/3ML) 0.083% nebulizer solution Take 2.5 mg by nebulization 2 (two) times daily as needed for wheezing or shortness of breath.    Historical Provider, MD  allopurinol (ZYLOPRIM) 100 MG tablet 100 mg. 04/20/13   Historical Provider, MD  aspirin 81 MG tablet Take 81 mg by mouth daily.    Historical Provider, MD  azithromycin (ZITHROMAX) 250 MG tablet  03/29/14   Historical  Provider, MD  Calcium Carbonate-Vitamin D (CALCIUM + D PO) Take 1 tablet by mouth 2 (two) times daily.    Historical Provider, MD  clindamycin (CLEOCIN T) 1 % lotion  03/18/13   Historical Provider, MD  cyanocobalamin (,VITAMIN B-12,) 1000 MCG/ML injection Inject 1,000 mcg into the muscle every 30 (thirty) days.    Historical Provider, MD  donepezil (ARICEPT) 10 MG tablet Take 10 mg by mouth at bedtime.    Historical Provider, MD  fluticasone Asencion Islam) 50 MCG/ACT nasal spray  03/18/13   Historical Provider, MD  HYDROcodone-homatropine (HYCODAN) 5-1.5 MG/5ML syrup Take 5 mLs by mouth every 4 (four) hours as needed for cough. 10/09/13   Posey Boyer, MD  ibuprofen (ADVIL,MOTRIN) 400 MG tablet Take 1 tablet (400 mg total) by mouth every 8 (eight) hours as needed. 07/12/14   Huntington Leverich L Tawan Corkern, PA-C  lactulose (CHRONULAC) 10 GM/15ML solution  02/26/13   Historical Provider, MD  losartan (COZAAR) 25 MG tablet Take 25 mg by mouth daily. Takes 1/2 tablet daily    Historical Provider, MD  mineral oil-hydrophilic petrolatum (AQUAPHOR) ointment Apply 1 application topically 2 (two) times daily.    Historical Provider, MD  omeprazole (PRILOSEC) 20 MG capsule  04/20/13   Historical Provider, MD  oxybutynin (DITROPAN) 5 MG tablet  03/27/14   Historical Provider, MD  pioglitazone-metformin (ACTOPLUS MET) 15-850 MG per tablet  04/20/13   Historical Provider, MD  pravastatin (PRAVACHOL) 20 MG tablet  04/20/13   Historical Provider, MD  PROAIR HFA 108 (  90 BASE) MCG/ACT inhaler  04/14/13   Historical Provider, MD  tamsulosin (FLOMAX) 0.4 MG CAPS capsule  04/20/13   Historical Provider, MD   BP 91/53 mmHg  Pulse 70  Temp(Src) 99.2 F (37.3 C) (Oral)  Resp 18  Wt 152 lb (68.947 kg)  SpO2 95% Physical Exam  Constitutional: He is oriented to person, place, and time. He appears well-developed and well-nourished. No distress.  HENT:  Head: Normocephalic and atraumatic.  Right Ear: External ear normal.  Left Ear:  External ear normal.  Nose: Nose normal.  Mouth/Throat: Oropharynx is clear and moist.  Eyes: Conjunctivae are normal.  Neck: Normal range of motion. Neck supple.  Cardiovascular: Normal rate, regular rhythm, normal heart sounds and intact distal pulses.   Pulmonary/Chest: Effort normal and breath sounds normal.  Abdominal: Soft. There is no tenderness.  Musculoskeletal: Normal range of motion.       Right hip: Normal.       Left hip: Normal.       Right knee: He exhibits normal range of motion, no swelling, no effusion, no ecchymosis, no deformity, no laceration, no erythema, normal alignment, no LCL laxity, normal patellar mobility and no bony tenderness. Tenderness found. Patellar tendon tenderness noted. No medial joint line, no lateral joint line, no MCL and no LCL tenderness noted.       Left knee: Normal.       Right upper leg: Normal.       Left upper leg: Normal.       Right lower leg: Normal.       Left lower leg: Normal.  Neurological: He is alert and oriented to person, place, and time.  Skin: Skin is warm and dry. He is not diaphoretic. No erythema.  Psychiatric: He has a normal mood and affect.  Nursing note and vitals reviewed.   ED Course  Procedures (including critical care time) Medications  acetaminophen (TYLENOL) tablet 650 mg (650 mg Oral Given 07/12/14 1947)    Labs Review Labs Reviewed - No data to display  Imaging Review Dg Knee Complete 4 Views Right  07/12/2014   CLINICAL DATA:  Down syndrome.  Hurt right knee.  EXAM: RIGHT KNEE - COMPLETE 4+ VIEW  COMPARISON:  None.  FINDINGS: No joint effusion. There is no fracture or subluxation identified. Moderate tricompartment osteoarthritis and chondrocalcinosis is identified.  IMPRESSION: 1. Moderate tricompartment osteoarthritis and chondrocalcinosis.   Electronically Signed   By: Kerby Moors M.D.   On: 07/12/2014 20:43     EKG Interpretation None      MDM   Final diagnoses:  Right knee pain   Osteoarthritis of right knee, unspecified osteoarthritis type    Filed Vitals:   07/12/14 2113  BP: 91/53  Pulse: 70  Temp: 99.2 F (37.3 C)  Resp: 18   Afebrile, NAD, non-toxic appearing, AAOx4.  Neurovascularly intact. Normal sensation. No evidence of compartment syndrome. Patient X-Ray negative for obvious fracture or dislocation, changes consistent with arthritis, similar to previous ED x-rays. Pain managed in ED. Pt advised to follow up with Dr. Barbaraann Barthel if symptoms persist for possibility of missed fracture diagnosis. Conservative therapy recommended and discussed. Patient will be dc home & care taker is agreeable with above plan. Patient is stable at time of discharge     Gari Crown 07/12/14 2158  Orlie Dakin, MD 07/12/14 2223

## 2014-07-20 LAB — HEMOGLOBIN A1C: HEMOGLOBIN A1C: 5.9 % (ref 4.0–6.0)

## 2014-07-20 LAB — BASIC METABOLIC PANEL: Creatinine: 1.6 mg/dL — AB (ref <=1.3)

## 2014-07-20 LAB — LIPID PANEL: LDL CALC: 103 mg/dL

## 2014-07-22 ENCOUNTER — Ambulatory Visit: Payer: Medicare Other | Admitting: Endocrinology

## 2014-08-03 ENCOUNTER — Encounter: Payer: Self-pay | Admitting: Endocrinology

## 2014-08-03 ENCOUNTER — Ambulatory Visit (INDEPENDENT_AMBULATORY_CARE_PROVIDER_SITE_OTHER): Payer: Medicare Other | Admitting: Endocrinology

## 2014-08-03 VITALS — BP 102/64 | HR 61 | Temp 98.0°F | Resp 14 | Ht 61.0 in | Wt 155.2 lb

## 2014-08-03 DIAGNOSIS — E7800 Pure hypercholesterolemia, unspecified: Secondary | ICD-10-CM

## 2014-08-03 DIAGNOSIS — Q909 Down syndrome, unspecified: Secondary | ICD-10-CM

## 2014-08-03 DIAGNOSIS — E119 Type 2 diabetes mellitus without complications: Secondary | ICD-10-CM

## 2014-08-03 DIAGNOSIS — N182 Chronic kidney disease, stage 2 (mild): Secondary | ICD-10-CM

## 2014-08-03 LAB — MICROALBUMIN / CREATININE URINE RATIO
Creatinine,U: 70.7 mg/dL
Microalb Creat Ratio: 3.5 mg/g (ref 0.0–30.0)
Microalb, Ur: 2.5 mg/dL — ABNORMAL HIGH (ref 0.0–1.9)

## 2014-08-03 NOTE — Progress Notes (Signed)
Patient ID: [name] [name], male   DOB: 12-26-1950, 63 y.o.   MRN: [id number]   Reason for Appointment: Diabetes follow-up   History of Present Illness   Diagnosis: Type 2 DIABETES MELITUS, date of diagnosis:  ? 1988     Previous history: He was initially treated with metformin with fairly good control but subsequently needed additional medications including Actos for control. However with weight loss over the last couple of years his Amaryl was tapered off  His A1c has usually been upper normal consistently  Recent history:  He is back in followup for his mild diabetes He has been on Actoplusmet once a day alone with good control.  Actos was started back in place of Januvia when he was losing weight progressively in the past His weight has improved a little A1c is again excellent but this was done from a different lab He has been having his blood sugar checked at the rest home and these are fairly good with sporadic high readings both morning and evening, appear to be higher after Thanksgiving Does not appear to be very active despite reminders Difficult to get a history from him Lab glucose was 100 drawn in the morning    Oral hypoglycemic drugs: Actoplusmet once a day      Side effects from medications: None       Monitors blood glucose:  once or twice a day.    Glucometer:  unknown brand       Blood Glucose readings from [facility] review:   Mornings 88-149, after supper 101-183   Hypoglycemia frequency:  none        Meals: 3 meals per day.  unable to get diet history         Physical activity: exercise: Minimal        Complications: are: None   Wt Readings from Last 3 Encounters:  08/03/14 155 lb 3.2 oz (70.398 kg)  07/12/14 152 lb (68.947 kg)  03/30/14 152 lb 6.4 oz (69.128 kg)    LABS:  Lab Results  Component Value Date   HGBA1C 5.9 07/20/2014   HGBA1C 5.7 01/25/2014   HGBA1C 5.6 09/28/2013   Lab Results  Component Value Date   MICROALBUR 0.2 05/04/2013   LDLCALC 103  07/20/2014   CREATININE 1.6* 07/20/2014     Office Visit on 08/03/2014  Component Date Value Ref Range Status  . Creatinine 07/20/2014 1.6* .6 - 1.3 mg/dL Final  . LDL Cholesterol 07/20/2014 103   Final  . Hgb A1c MFr Bld 07/20/2014 5.9  4.0 - 6.0 % Final      Medication List       This list is accurate as of: 08/03/14  3:50 PM.  Always use your most recent med list.               acetaminophen 500 MG tablet  Commonly known as:  TYLENOL  Take 500 mg by mouth every 6 (six) hours as needed for pain.     allopurinol 100 MG tablet  Commonly known as:  ZYLOPRIM  100 mg.     aspirin 81 MG tablet  Take 81 mg by mouth daily.     azithromycin 250 MG tablet  Commonly known as:  ZITHROMAX     CALCIUM + D PO  Take 1 tablet by mouth 2 (two) times daily.     clindamycin 1 % lotion  Commonly known as:  CLEOCIN T     cyanocobalamin 1000 MCG/ML injection  Commonly  known as:  (VITAMIN B-12)  Inject 1,000 mcg into the muscle every 30 (thirty) days.     donepezil 10 MG tablet  Commonly known as:  ARICEPT  Take 10 mg by mouth at bedtime.     fluticasone 50 MCG/ACT nasal spray  Commonly known as:  FLONASE     HYDROcodone-homatropine 5-1.5 MG/5ML syrup  Commonly known as:  HYCODAN  Take 5 mLs by mouth every 4 (four) hours as needed for cough.     ibuprofen 400 MG tablet  Commonly known as:  ADVIL,MOTRIN  Take 1 tablet (400 mg total) by mouth every 8 (eight) hours as needed.     lactulose 10 GM/15ML solution  Commonly known as:  CHRONULAC     losartan 25 MG tablet  Commonly known as:  COZAAR  Take 25 mg by mouth daily. Takes 1/2 tablet daily     mineral oil-hydrophilic petrolatum ointment  Apply 1 application topically 2 (two) times daily.     nabumetone 750 MG tablet  Commonly known as:  RELAFEN  Take 750 mg by mouth daily.     omeprazole 20 MG capsule  Commonly known as:  PRILOSEC     oxybutynin 5 MG tablet  Commonly known as:  DITROPAN      pioglitazone-metformin 15-850 MG per tablet  Commonly known as:  ACTOPLUS MET     pravastatin 20 MG tablet  Commonly known as:  PRAVACHOL     albuterol (2.5 MG/3ML) 0.083% nebulizer solution  Commonly known as:  PROVENTIL  Take 2.5 mg by nebulization 2 (two) times daily as needed for wheezing or shortness of breath.     PROAIR HFA 108 (90 BASE) MCG/ACT inhaler  Generic drug:  albuterol     tamsulosin 0.4 MG Caps capsule  Commonly known as:  FLOMAX        Allergies: No Known Allergies  Past Medical History  Diagnosis Date  . Down syndrome   . Diabetes mellitus without complication     No past surgical history on file.  No family history on file.  Social History:  reports that he has never smoked. He has never used smokeless tobacco. His alcohol and drug histories are not on file.  Review of Systems:  Hypertension:  not present, taking low-dose losartan prescribed by PCP His creatinine has been high normal but now it is clearly higher at 1.56 and also his potassium is high normal at 5.0  Lipids: Lipids are only barely controlled with pravastatin 20 mg dose  Lab Results  Component Value Date   CHOL 146 09/28/2013   HDL 53 09/28/2013   LDLCALC 103 07/20/2014   TRIG 103 09/28/2013   CHOLHDL 2.8 09/28/2013   Diabetic foot exam done in 03/2014.  He is unreliable with his responses for sensory testing     Examination:   BP 102/64 mmHg  Pulse 61  Temp(Src) 98 F (36.7 C)  Resp 14  Ht _0  (1.549 m)  Wt 155 lb 3.2 oz (70.398 kg)  BMI 29.34 kg/m2  SpO2 97%  Body mass index is 29.34 kg/(m^2).   No peripheral edema    ASSESSMENT/ PLAN:   Diabetes type 2   Blood glucose control is excellent with A1c again near normal Weight is stable with low-dose Actos and metformin only His home glucose monitor indicates overall fair results with minimal increase in fasting glucose and generally good readings after evening meal except on the Thanksgiving evening weekend   Advised him to start  walking regularly He will continue the same regimen and followup in 4 months  RENAL insufficiency: His creatinine is higher likely to be from taking chronic Relafen and losartan.  Also blood pressure is low normal He will be taken off both these drugs for now and will send this note to his PCP BMP to be ordered in 4 weeks  History of mild hyperlipidemia and this is treated by PCP, LDL is 103   Yeslin Delio 08/03/2014, 3:50 PM

## 2014-10-07 ENCOUNTER — Telehealth: Payer: Self-pay | Admitting: Endocrinology

## 2014-10-07 NOTE — Telephone Encounter (Signed)
Please read message below.  

## 2014-10-07 NOTE — Telephone Encounter (Signed)
Did we receive the labs [phone]

## 2014-10-08 NOTE — Telephone Encounter (Signed)
Labs have been received, they are aware

## 2014-10-28 ENCOUNTER — Other Ambulatory Visit: Payer: Medicare Other

## 2014-11-02 ENCOUNTER — Ambulatory Visit: Payer: Medicare Other | Admitting: Endocrinology

## 2014-11-05 ENCOUNTER — Ambulatory Visit: Payer: Medicare Other | Admitting: Endocrinology

## 2014-11-05 ENCOUNTER — Other Ambulatory Visit (INDEPENDENT_AMBULATORY_CARE_PROVIDER_SITE_OTHER): Payer: Medicare Other

## 2014-11-05 ENCOUNTER — Other Ambulatory Visit: Payer: Medicare Other

## 2014-11-05 DIAGNOSIS — E119 Type 2 diabetes mellitus without complications: Secondary | ICD-10-CM | POA: Diagnosis not present

## 2014-11-05 LAB — BASIC METABOLIC PANEL
BUN: 32 mg/dL — AB (ref 6–23)
CO2: 32 mEq/L (ref 19–32)
CREATININE: 1.51 mg/dL — AB (ref 0.40–1.50)
Calcium: 9.6 mg/dL (ref 8.4–10.5)
Chloride: 103 mEq/L (ref 96–112)
GFR: 50.08 mL/min — AB (ref >60.00)
Glucose, Bld: 88 mg/dL (ref 70–99)
POTASSIUM: 4.4 meq/L (ref 3.5–5.1)
Sodium: 139 mEq/L (ref 135–145)

## 2014-11-05 LAB — HEMOGLOBIN A1C: Hgb A1c MFr Bld: 5.9 % (ref 4.6–6.5)

## 2014-11-08 ENCOUNTER — Encounter: Payer: Self-pay | Admitting: Endocrinology

## 2014-11-08 ENCOUNTER — Ambulatory Visit (INDEPENDENT_AMBULATORY_CARE_PROVIDER_SITE_OTHER): Payer: Medicare Other | Admitting: Endocrinology

## 2014-11-08 VITALS — BP 102/54 | HR 63 | Temp 98.0°F | Resp 16 | Ht 61.0 in | Wt 153.4 lb

## 2014-11-08 DIAGNOSIS — E119 Type 2 diabetes mellitus without complications: Secondary | ICD-10-CM

## 2014-11-08 DIAGNOSIS — Q909 Down syndrome, unspecified: Secondary | ICD-10-CM

## 2014-11-08 DIAGNOSIS — N182 Chronic kidney disease, stage 2 (mild): Secondary | ICD-10-CM | POA: Diagnosis not present

## 2014-11-08 NOTE — Progress Notes (Signed)
Patient ID: [name] [name], male   DOB: [birth year], 64 y.o.   MRN: [id number]   Reason for Appointment: Diabetes follow-up   History of Present Illness   Diagnosis: Type 2 DIABETES MELITUS, date of diagnosis:  ? 1988     Previous history: He was initially treated with metformin with fairly good control but subsequently needed additional medications including Actos for control. However with weight loss over the last couple of years his Amaryl was tapered off  His A1c has usually been upper normal consistently Actos was started back in place of Januvia when he was losing weight progressively in the past  Recent history:  He has mild diabetes, generally easier to control more recently He has been on Actoplusmet once a day alone with good control.  His weight has been fairly stable within 2 pounds A1c is again excellent but lower than expected for his home readings; not clear if home readings are accurate as blood sugar was lower in the lab compared to home readings Does not appear to be very active despite reminding the rest home to have him start walking Difficult to get a history from him Lab glucose was 88  Oral hypoglycemic drugs: Actoplusmet once a day      Side effects from medications: None       Monitors blood glucose:  once  a day.    Glucometer:  unknown brand       Blood Glucose readings from [facility] review:   Mornings 113-142.  8 PM = 114-145     Meals: 3 meals per day.  Unable to get diet history         Physical activity: exercise: No program of walking or other exercise    Wt Readings from Last 3 Encounters:  11/08/14 153 lb 6.4 oz (69.582 kg)  08/03/14 155 lb 3.2 oz (70.398 kg)  07/12/14 152 lb (68.947 kg)    LABS:  Lab Results  Component Value Date   HGBA1C 5.9 11/05/2014   HGBA1C 5.9 07/20/2014   HGBA1C 5.7 01/25/2014   Lab Results  Component Value Date   MICROALBUR 2.5* 08/03/2014   LDLCALC 103 07/20/2014   CREATININE 1.51* 11/05/2014     Lab on  11/05/2014  Component Date Value Ref Range Status  . Hgb A1c MFr Bld 11/05/2014 5.9  4.6 - 6.5 % Final   Glycemic Control Guidelines for People with Diabetes:Non Diabetic:  <6%Goal of Therapy: <7%Additional Action Suggested:  >8%   . Sodium 11/05/2014 139  135 - 145 mEq/L Final  . Potassium 11/05/2014 4.4  3.5 - 5.1 mEq/L Final  . Chloride 11/05/2014 103  96 - 112 mEq/L Final  . CO2 11/05/2014 32  19 - 32 mEq/L Final  . Glucose, Bld 11/05/2014 88  70 - 99 mg/dL Final  . BUN 11/05/2014 32* 6 - 23 mg/dL Final  . Creatinine, Ser 11/05/2014 1.51* 0.40 - 1.50 mg/dL Final  . Calcium 11/05/2014 9.6  8.4 - 10.5 mg/dL Final  . GFR 11/05/2014 50.08* >60.00 mL/min Final      Medication List       This list is accurate as of: 11/08/14 10:17 AM.  Always use your most recent med list.               acetaminophen 500 MG tablet  Commonly known as:  TYLENOL  Take 500 mg by mouth every 6 (six) hours as needed for pain.     allopurinol 100 MG tablet  Commonly known  as:  ZYLOPRIM  100 mg.     aspirin 81 MG tablet  Take 81 mg by mouth daily.     azithromycin 250 MG tablet  Commonly known as:  ZITHROMAX     CALCIUM + D PO  Take 1 tablet by mouth 2 (two) times daily.     clindamycin 1 % lotion  Commonly known as:  CLEOCIN T     [facility] MELATONIN 3 MG Tabs  Generic drug:  Melatonin  Take 1 tablet by mouth at bedtime.     cyanocobalamin 1000 MCG/ML injection  Commonly known as:  (VITAMIN B-12)  Inject 1,000 mcg into the muscle every 30 (thirty) days.     donepezil 5 MG tablet  Commonly known as:  ARICEPT     fluticasone 50 MCG/ACT nasal spray  Commonly known as:  FLONASE     HYDROcodone-homatropine 5-1.5 MG/5ML syrup  Commonly known as:  HYCODAN  Take 5 mLs by mouth every 4 (four) hours as needed for cough.     ibuprofen 400 MG tablet  Commonly known as:  ADVIL,MOTRIN  Take 1 tablet (400 mg total) by mouth every 8 (eight) hours as needed.     lactulose 10 GM/15ML solution   Commonly known as:  CHRONULAC     losartan 25 MG tablet  Commonly known as:  COZAAR  Take 25 mg by mouth daily. Takes 1/2 tablet daily     mineral oil-hydrophilic petrolatum ointment  Apply 1 application topically 2 (two) times daily.     nabumetone 750 MG tablet  Commonly known as:  RELAFEN  Take 750 mg by mouth daily.     omeprazole 20 MG capsule  Commonly known as:  PRILOSEC     oxybutynin 5 MG tablet  Commonly known as:  DITROPAN     pioglitazone-metformin 15-850 MG per tablet  Commonly known as:  ACTOPLUS MET     pravastatin 20 MG tablet  Commonly known as:  PRAVACHOL     albuterol (2.5 MG/3ML) 0.083% nebulizer solution  Commonly known as:  PROVENTIL  Take 2.5 mg by nebulization 2 (two) times daily as needed for wheezing or shortness of breath.     PROAIR HFA 108 (90 BASE) MCG/ACT inhaler  Generic drug:  albuterol     ranitidine 150 MG tablet  Commonly known as:  ZANTAC     tamsulosin 0.4 MG Caps capsule  Commonly known as:  FLOMAX     VOLTAREN 1 % Gel  Generic drug:  diclofenac sodium        Allergies: No Known Allergies  Past Medical History  Diagnosis Date  . Down syndrome   . Diabetes mellitus without complication     No past surgical history on file.  No family history on file.  Social History:  reports that he has never smoked. He has never used smokeless tobacco. His alcohol and drug histories are not on file.  Review of Systems:  Hypertension:  not present, taking low-dose losartan prescribed by PCP. This was stopped but apparently restarted at 12.5 mg daily.  No history of microalbuminuria and not clear why he needs it His creatinine has been high normal but now persistently abnormal, not clear of etiology  Lab Results  Component Value Date   CREATININE 1.51* 11/05/2014   CREATININE 1.6* 07/20/2014   CREATININE 1.3 01/25/2014     Lipids: Lipids are fair, taking pravastatin 20 mg  Lab Results  Component Value Date   CHOL 146  09/28/2013  HDL 53 09/28/2013   LDLCALC 103 07/20/2014   TRIG 103 09/28/2013   CHOLHDL 2.8 09/28/2013   Diabetic foot exam done in 03/2014.  He is unreliable with his responses for sensory testing     Examination:   BP 102/54 mmHg  Pulse 63  Temp(Src) 98 F (36.7 C)  Resp 16  Ht _0  (1.549 m)  Wt 153 lb 6.4 oz (69.582 kg)  BMI 29.00 kg/m2  SpO2 99%  Body mass index is 29 kg/(m^2).   No peripheral edema    ASSESSMENT/ PLAN:   Diabetes type 2   Blood glucose control is excellent with A1c again upper normal Has been consistently controlled with low-dose Actos and metformin only His home glucose monitor indicates overall fair results but lab glucose is lower than readings at home May have minimal increase in fasting glucose at home He will continue the same regimen  Advised him to start walking regularly and written instructions given He will continue the same regimen and followup in 4 months  RENAL insufficiency: His creatinine is persistently high and would recommend nephrology consultation, deferred to PCP  History of mild hyperlipidemia and this is treated by PCP, LDL is 103   Michalene Debruler 11/08/2014, 10:17 AM

## 2014-11-08 NOTE — Patient Instructions (Signed)
Lab on 11/05/2014  Component Date Value Ref Range Status  . Hgb A1c MFr Bld 11/05/2014 5.9  4.6 - 6.5 % Final   Glycemic Control Guidelines for People with Diabetes:Non Diabetic:  <6%Goal of Therapy: <7%Additional Action Suggested:  >8%   . Sodium 11/05/2014 139  135 - 145 mEq/L Final  . Potassium 11/05/2014 4.4  3.5 - 5.1 mEq/L Final  . Chloride 11/05/2014 103  96 - 112 mEq/L Final  . CO2 11/05/2014 32  19 - 32 mEq/L Final  . Glucose, Bld 11/05/2014 88  70 - 99 mg/dL Final  . BUN 16/10/960403/18/2016 32* 6 - 23 mg/dL Final  . Creatinine, Ser 11/05/2014 1.51* 0.40 - 1.50 mg/dL Final  . Calcium 54/09/811903/18/2016 9.6  8.4 - 10.5 mg/dL Final  . GFR 14/78/295603/18/2016 50.08* >60.00 mL/min Final

## 2014-12-02 ENCOUNTER — Encounter: Payer: Self-pay | Admitting: *Deleted

## 2014-12-02 ENCOUNTER — Telehealth: Payer: Self-pay | Admitting: Endocrinology

## 2014-12-10 NOTE — Telephone Encounter (Signed)
error 

## 2014-12-29 ENCOUNTER — Emergency Department (HOSPITAL_COMMUNITY): Payer: Medicare Other

## 2014-12-29 ENCOUNTER — Observation Stay (HOSPITAL_COMMUNITY)
Admission: EM | Admit: 2014-12-29 | Discharge: 2014-12-31 | Disposition: A | Discharge status: Home / Self Care | Payer: Medicare Other | Attending: Internal Medicine | Admitting: Internal Medicine

## 2014-12-29 ENCOUNTER — Encounter (HOSPITAL_COMMUNITY): Payer: Self-pay | Admitting: Emergency Medicine

## 2014-12-29 DIAGNOSIS — Z7982 Long term (current) use of aspirin: Secondary | ICD-10-CM | POA: Diagnosis not present

## 2014-12-29 DIAGNOSIS — J449 Chronic obstructive pulmonary disease, unspecified: Secondary | ICD-10-CM | POA: Insufficient documentation

## 2014-12-29 DIAGNOSIS — I129 Hypertensive chronic kidney disease with stage 1 through stage 4 chronic kidney disease, or unspecified chronic kidney disease: Secondary | ICD-10-CM | POA: Diagnosis not present

## 2014-12-29 DIAGNOSIS — R21 Rash and other nonspecific skin eruption: Secondary | ICD-10-CM | POA: Diagnosis not present

## 2014-12-29 DIAGNOSIS — N189 Chronic kidney disease, unspecified: Secondary | ICD-10-CM | POA: Insufficient documentation

## 2014-12-29 DIAGNOSIS — R072 Precordial pain: Secondary | ICD-10-CM

## 2014-12-29 DIAGNOSIS — J189 Pneumonia, unspecified organism: Secondary | ICD-10-CM

## 2014-12-29 DIAGNOSIS — R079 Chest pain, unspecified: Secondary | ICD-10-CM | POA: Diagnosis present

## 2014-12-29 DIAGNOSIS — R0789 Other chest pain: Secondary | ICD-10-CM | POA: Diagnosis not present

## 2014-12-29 DIAGNOSIS — E785 Hyperlipidemia, unspecified: Secondary | ICD-10-CM | POA: Insufficient documentation

## 2014-12-29 DIAGNOSIS — Q909 Down syndrome, unspecified: Secondary | ICD-10-CM | POA: Insufficient documentation

## 2014-12-29 DIAGNOSIS — E119 Type 2 diabetes mellitus without complications: Secondary | ICD-10-CM | POA: Insufficient documentation

## 2014-12-29 LAB — BASIC METABOLIC PANEL
Anion gap: 9 (ref 5–15)
BUN: 22 mg/dL — ABNORMAL HIGH (ref 6–20)
CO2: 31 mmol/L (ref 22–32)
Calcium: 9.6 mg/dL (ref 8.9–10.3)
Chloride: 102 mmol/L (ref 101–111)
Creatinine, Ser: 1.66 mg/dL — ABNORMAL HIGH (ref 0.61–1.24)
GFR calc non Af Amer: 43 mL/min — ABNORMAL LOW (ref >60)
GFR, EST AFRICAN AMERICAN: 50 mL/min — AB (ref >60)
Glucose, Bld: 106 mg/dL — ABNORMAL HIGH (ref 70–99)
POTASSIUM: 4.1 mmol/L (ref 3.5–5.1)
SODIUM: 142 mmol/L (ref 135–145)

## 2014-12-29 LAB — CBC
HCT: 34.7 % — ABNORMAL LOW (ref 39.0–52.0)
HEMOGLOBIN: 11.6 g/dL — AB (ref 13.0–17.0)
MCH: 32.8 pg (ref 26.0–34.0)
MCHC: 33.4 g/dL (ref 30.0–36.0)
MCV: 98 fL (ref 78.0–100.0)
Platelets: 185 10*3/uL (ref 150–400)
RBC: 3.54 MIL/uL — ABNORMAL LOW (ref 4.22–5.81)
RDW: 14.7 % (ref 11.5–15.5)
WBC: 4.8 10*3/uL (ref 4.0–10.5)

## 2014-12-29 LAB — HEPATIC FUNCTION PANEL
ALBUMIN: 3.3 g/dL — AB (ref 3.5–5.0)
ALK PHOS: 46 U/L (ref 38–126)
ALT: 18 U/L (ref 17–63)
AST: 24 U/L (ref 15–41)
Bilirubin, Direct: 0.2 mg/dL (ref 0.1–0.5)
Indirect Bilirubin: 0.8 mg/dL (ref 0.3–0.9)
TOTAL PROTEIN: 6.2 g/dL — AB (ref 6.5–8.1)
Total Bilirubin: 1 mg/dL (ref 0.3–1.2)

## 2014-12-29 LAB — LIPASE, BLOOD: Lipase: 20 U/L — ABNORMAL LOW (ref 22–51)

## 2014-12-29 LAB — I-STAT TROPONIN, ED: TROPONIN I, POC: 0 ng/mL (ref 0.00–0.08)

## 2014-12-29 MED ORDER — SODIUM CHLORIDE 0.9 % IV BOLUS (SEPSIS)
1000.0000 mL | Freq: Once | INTRAVENOUS | Status: AC
  Administered 2014-12-29: 1000 mL via INTRAVENOUS

## 2014-12-29 MED ORDER — GI COCKTAIL ~~LOC~~
30.0000 mL | Freq: Once | ORAL | Status: AC
  Administered 2014-12-29: 30 mL via ORAL
  Filled 2014-12-29: qty 30

## 2014-12-29 NOTE — ED Notes (Signed)
Per EMS, patient has chest pain and throat pain. Hx of down syndrome. 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

## 2014-12-29 NOTE — ED Notes (Signed)
Jen, PA-C, at the bedside.  

## 2014-12-29 NOTE — ED Provider Notes (Signed)
CSN: [id number]     Arrival date & time 12/29/14  2047 History   First MD Initiated Contact with Patient 12/29/14 2050     Chief Complaint  Patient presents with  . Chest Pain  . Sore Throat     (Consider location/radiation/quality/duration/timing/severity/associated sxs/prior Treatment) HPI Comments: Patient is a 64 year old male past medical history significant for Down syndrome, DM, hypertension, hyperlipidemia, COPD, sleep apnea, mental retardation presenting to the emergency department for evaluation of chest pain that began approximately 7:30 PM. Patient states "it began hurting." Unable to describe pain any further. Denies any pain currently. Unknown history of echocardiogram, stress tests. No history of cardiac catheterization. Patient is a level V caveat d/t MR.    Past Medical History  Diagnosis Date  . Down syndrome   . Diabetes mellitus without complication    History reviewed. No pertinent past surgical history. History reviewed. No pertinent family history. History  Substance Use Topics  . Smoking status: Never Smoker   . Smokeless tobacco: Never Used  . Alcohol Use: No    Review of Systems  Unable to perform ROS: Other      Allergies  Review of patient's allergies indicates no known allergies.  Home Medications   Prior to Admission medications   Medication Sig Start Date End Date Taking? Authorizing Provider  acetaminophen (TYLENOL) 500 MG tablet Take 500 mg by mouth 2 (two) times daily.    Yes Historical Provider, MD  albuterol (PROVENTIL) (2.5 MG/3ML) 0.083% nebulizer solution Take 2.5 mg by nebulization 2 (two) times daily.    Yes Historical Provider, MD  allopurinol (ZYLOPRIM) 100 MG tablet Take 100 mg by mouth daily.  04/20/13  Yes Historical Provider, MD  aspirin 81 MG tablet Take 81 mg by mouth daily.   Yes Historical Provider, MD  Calcium Carbonate-Vitamin D (CALCIUM + D PO) Take 1 tablet by mouth daily.    Yes Historical Provider, MD  clindamycin  (CLINDAGEL) 1 % gel Apply 1 application topically as needed (for irritation).  11/17/14  Yes Historical Provider, MD  [employer] MELATONIN 3 MG TABS Take 3 mg by mouth at bedtime.  09/08/14  Yes Historical Provider, MD  donepezil (ARICEPT) 5 MG tablet Take 5 mg by mouth at bedtime.  10/19/14  Yes Historical Provider, MD  fluticasone (FLONASE) 50 MCG/ACT nasal spray Place 2 sprays into both nostrils as needed for allergies.  09/28/14  Yes Historical Provider, MD  lactulose (CHRONULAC) 10 GM/15ML solution Take 10 g by mouth daily as needed for moderate constipation.  02/26/13  Yes Historical Provider, MD  losartan (COZAAR) 25 MG tablet Take 25 mg by mouth daily.    Yes Historical Provider, MD  mineral oil-hydrophilic petrolatum (AQUAPHOR) ointment Apply 1 application topically 2 (two) times daily.   Yes Historical Provider, MD  oxybutynin (DITROPAN) 5 MG tablet Take 5 mg by mouth daily.  03/27/14  Yes Historical Provider, MD  pioglitazone-metformin (ACTOPLUS MET) 15-850 MG per tablet Take 1 tablet by mouth 2 (two) times daily with a meal.  04/20/13  Yes Historical Provider, MD  pravastatin (PRAVACHOL) 20 MG tablet Take 20 mg by mouth daily.  04/20/13  Yes Historical Provider, MD  ranitidine (ZANTAC) 150 MG tablet Take 150 mg by mouth daily.  10/19/14  Yes Historical Provider, MD  tamsulosin (FLOMAX) 0.4 MG CAPS capsule Take 0.4 mg by mouth daily.  04/20/13  Yes Historical Provider, MD  VOLTAREN 1 % GEL Apply 2 g topically as needed (for shoulder pain).  09/28/14  Yes Historical Provider, MD  HYDROcodone-homatropine (HYCODAN) 5-1.5 MG/5ML syrup Take 5 mLs by mouth every 4 (four) hours as needed for cough. 10/09/13   Posey Boyer, MD  ibuprofen (ADVIL,MOTRIN) 400 MG tablet Take 1 tablet (400 mg total) by mouth every 8 (eight) hours as needed. 07/12/14   Gertrude Tarbet, PA-C   BP 98/54 mmHg  Pulse 56  Temp(Src) 97.5 F (36.4 C) (Oral)  Resp 21  Ht 5' (1.524 m)  Wt 150 lb (68.04 kg)  BMI 29.30 kg/m2  SpO2  100% Physical Exam  Constitutional: He is oriented to person, place, and time. He appears well-developed and well-nourished.  HENT:  Head: Normocephalic and atraumatic.  Right Ear: External ear normal.  Left Ear: External ear normal.  Nose: Nose normal.  Mouth/Throat: Oropharynx is clear and moist. No oropharyngeal exudate.  Eyes: Conjunctivae are normal.  Neck: Neck supple.  Cardiovascular: Normal rate, regular rhythm and normal heart sounds.   Pulmonary/Chest: Effort normal and breath sounds normal.  Abdominal: Soft. There is no tenderness.  Musculoskeletal: He exhibits no edema.  Lymphadenopathy:    He has no cervical adenopathy.  Neurological: He is alert and oriented to person, place, and time.  Skin: Skin is warm and dry.  Vitals reviewed.   ED Course  Procedures (including critical care time) Medications  clindamycin (CLINDAGEL) 1 % gel 1 application (not administered)  fluticasone (FLONASE) 50 MCG/ACT nasal spray 2 spray (not administered)  donepezil (ARICEPT) tablet 5 mg (not administered)  famotidine (PEPCID) tablet 20 mg (not administered)  oxybutynin (DITROPAN) tablet 5 mg (not administered)  albuterol (PROVENTIL) (2.5 MG/3ML) 0.083% nebulizer solution 2.5 mg (not administered)  allopurinol (ZYLOPRIM) tablet 100 mg (not administered)  aspirin EC tablet 81 mg (not administered)  pravastatin (PRAVACHOL) tablet 20 mg (not administered)  tamsulosin (FLOMAX) capsule 0.4 mg (not administered)  acetaminophen (TYLENOL) tablet 650 mg (not administered)  ondansetron (ZOFRAN) injection 4 mg (not administered)  insulin aspart (novoLOG) injection 0-9 Units (not administered)  heparin injection 5,000 Units (not administered)  gi cocktail (Maalox,Lidocaine,Donnatal) (30 mLs Oral Given 12/29/14 2127)  sodium chloride 0.9 % bolus 1,000 mL (0 mLs Intravenous Stopped 12/30/14 0020)    Labs Review Labs Reviewed  CBC - Abnormal; Notable for the following:    RBC 3.54 (*)     Hemoglobin 11.6 (*)    HCT 34.7 (*)    All other components within normal limits  BASIC METABOLIC PANEL - Abnormal; Notable for the following:    Glucose, Bld 106 (*)    BUN 22 (*)    Creatinine, Ser 1.66 (*)    GFR calc non Af Amer 43 (*)    GFR calc Af Amer 50 (*)    All other components within normal limits  HEPATIC FUNCTION PANEL - Abnormal; Notable for the following:    Total Protein 6.2 (*)    Albumin 3.3 (*)    All other components within normal limits  LIPASE, BLOOD - Abnormal; Notable for the following:    Lipase 20 (*)    All other components within normal limits  Randolm Idol, ED    Imaging Review Dg Chest Port 1 View  12/29/2014   CLINICAL DATA:  Chest pain and sore throat tonight  EXAM: PORTABLE CHEST - 1 VIEW  COMPARISON:  None.  FINDINGS: Shallow inspiration. Normal heart size and pulmonary vascularity. Linear atelectasis in the lung bases. No focal airspace disease or consolidation. No blunting of costophrenic angles. No pneumothorax.  IMPRESSION: Shallow inspiration  with atelectasis in the lung bases.   Electronically Signed   By: Lucienne Capers M.D.   On: 12/29/2014 21:38     EKG Interpretation   Date/Time:  Wednesday Dec 29 2014 21:02:01 EDT Ventricular Rate:  64 PR Interval:  163 QRS Duration: 145 QT Interval:  443 QTC Calculation: 457 R Axis:   0 Text Interpretation:  Sinus rhythm Right bundle branch block Sinus rhythm  Right bundle branch block Artifact T wave abnormality Abnormal ekg  Confirmed by Carmin Muskrat  MD ([id number]) on 12/29/2014 9:46:15 PM      MDM   Final diagnoses:  Chest pain, unspecified chest pain type    Filed Vitals:   12/30/14 0019  BP: 98/54  Pulse: 56  Temp:   Resp:    I have reviewed nursing notes, vital signs, and all appropriate lab and imaging results if ordered as above.    Concern for cardiac etiology of Chest Pain. Hospitalist has been consulted and will see patient in the ED for likely admit. Pt does not  meet criteria for CP protocol and a further evaluation is recommended. Pt has been re-evaluated prior to consult and VSS, NAD, heart RRR, pain 0/10, lungs CTAB. No acute abnormalities found on EKG and first round of cardiac enzymes negative. This case was discussed with Dr. Vanita Panda who has seen the patient and agrees with plan to admit.    Baron Sane, PA-C 12/30/14 0046  Carmin Muskrat, MD 12/30/14 [phone]

## 2014-12-29 NOTE — ED Notes (Signed)
Jen, pa-c, at the bedside.  

## 2014-12-29 NOTE — H&P (Signed)
[facility] History and Physical  Patient: [name] [name]  MRN: [id number]  DOB: 13-Jan-1951  DOS: the patient was seen and examined on 12/29/2014 PCP: No PCP Per Patient patient sees Dr. Lillia Corporal  Referring physician: Dr. Vanita Panda Chief Complaint: Chest pain  HPI: [name] [name] is a 64 y.o. male with Past medical history of Down syndrome, diabetes mellitus, hypertension, chronic kidney disease. The patient is presenting with complaints of chest pain. History was up and with the help of patient's caregiver. The patient was outside at a restaurant at a party. After the party the patient started having shaking of the left hand and it was fatigue. Later on the patient started complaining of chest pain with shortness of breath and therefore they brought him here. The time of my evaluation the chest pain was resolved. The patient is unable to specify what kind of chest pain was admitted was located centrally and to the left. He denies any prior chest pain. There is no recent fever chills cough. There is no loss of control of bowel or bladder. No passing out episodes. No fall no trauma no injury. No recent changes in his medications. No diarrhea no vomiting no burning urination. Patient was recently being treated for UTI. Reported that his urine was not cleared and he was recommended to be started on antibiotics again.  The patient is coming from home. And at his baseline dependent for most of his ADL.  Review of Systems: as mentioned in the history of present illness.  A comprehensive review of the other systems is negative.  Past Medical History  Diagnosis Date  . Down syndrome   . Diabetes mellitus without complication    History reviewed. No pertinent past surgical history. Social History:  reports that he has never smoked. He has never used smokeless tobacco. He reports that he does not drink alcohol. His drug history is not on file.  No Known Allergies  History  reviewed. No pertinent family history.  Prior to Admission medications   Medication Sig Start Date End Date Taking? Authorizing Provider  acetaminophen (TYLENOL) 500 MG tablet Take 500 mg by mouth 2 (two) times daily.    Yes Historical Provider, MD  albuterol (PROVENTIL) (2.5 MG/3ML) 0.083% nebulizer solution Take 2.5 mg by nebulization 2 (two) times daily.    Yes Historical Provider, MD  allopurinol (ZYLOPRIM) 100 MG tablet Take 100 mg by mouth daily.  04/20/13  Yes Historical Provider, MD  aspirin 81 MG tablet Take 81 mg by mouth daily.   Yes Historical Provider, MD  Calcium Carbonate-Vitamin D (CALCIUM + D PO) Take 1 tablet by mouth daily.    Yes Historical Provider, MD  clindamycin (CLINDAGEL) 1 % gel Apply 1 application topically as needed (for irritation).  11/17/14  Yes Historical Provider, MD  [employer] MELATONIN 3 MG TABS Take 3 mg by mouth at bedtime.  09/08/14  Yes Historical Provider, MD  donepezil (ARICEPT) 5 MG tablet Take 5 mg by mouth at bedtime.  10/19/14  Yes Historical Provider, MD  fluticasone (FLONASE) 50 MCG/ACT nasal spray Place 2 sprays into both nostrils as needed for allergies.  09/28/14  Yes Historical Provider, MD  lactulose (CHRONULAC) 10 GM/15ML solution Take 10 g by mouth daily as needed for moderate constipation.  02/26/13  Yes Historical Provider, MD  losartan (COZAAR) 25 MG tablet Take 25 mg by mouth daily.    Yes Historical Provider, MD  mineral oil-hydrophilic petrolatum (AQUAPHOR) ointment Apply 1 application topically 2 (two)  times daily.   Yes Historical Provider, MD  oxybutynin (DITROPAN) 5 MG tablet Take 5 mg by mouth daily.  03/27/14  Yes Historical Provider, MD  pioglitazone-metformin (ACTOPLUS MET) 15-850 MG per tablet Take 1 tablet by mouth 2 (two) times daily with a meal.  04/20/13  Yes Historical Provider, MD  pravastatin (PRAVACHOL) 20 MG tablet Take 20 mg by mouth daily.  04/20/13  Yes Historical Provider, MD  ranitidine (ZANTAC) 150 MG tablet Take 150 mg by mouth  daily.  10/19/14  Yes Historical Provider, MD  tamsulosin (FLOMAX) 0.4 MG CAPS capsule Take 0.4 mg by mouth daily.  04/20/13  Yes Historical Provider, MD  VOLTAREN 1 % GEL Apply 2 g topically as needed (for shoulder pain).  09/28/14  Yes Historical Provider, MD  HYDROcodone-homatropine (HYCODAN) 5-1.5 MG/5ML syrup Take 5 mLs by mouth every 4 (four) hours as needed for cough. 10/09/13   Posey Boyer, MD  ibuprofen (ADVIL,MOTRIN) 400 MG tablet Take 1 tablet (400 mg total) by mouth every 8 (eight) hours as needed. 07/12/14   Baron Sane, PA-C    Physical Exam: Filed Vitals:   12/29/14 2102 12/29/14 2206 12/29/14 2257  BP: 108/64 100/58 92/54  Pulse: 63 63 57  Temp: 98.1 F (36.7 C) 98 F (36.7 C) 97.5 F (36.4 C)  TempSrc: Oral Oral Oral  Resp: 13 21 21   Height: 5' (1.524 m)    Weight: 68.04 kg (150 lb)    SpO2: 100% 96% 100%    General: Alert, Awake and Oriented to Time, Place and Person. Appear in mild distress Eyes: PERRL ENT: Oral Mucosa clear moist. Neck: no JVD Cardiovascular: S1 and S2 Present, aortic systolic Murmur, Peripheral Pulses Present Respiratory: Bilateral Air entry equal and Decreased,  Clear to Auscultation, no Crackles, no wheezes Abdomen: Bowel Sound present, Soft and non tender Skin: Facial papular Rash which is chronic as per caregiver Extremities: no Pedal edema, no calf tenderness Neurologic: Grossly no focal neuro deficit.  Labs on Admission:  CBC:  Recent Labs Lab 12/29/14 2103  WBC 4.8  HGB 11.6*  HCT 34.7*  MCV 98.0  PLT 185    CMP     Component Value Date/Time   NA 142 12/29/2014 2103   K 4.1 12/29/2014 2103   CL 102 12/29/2014 2103   CO2 31 12/29/2014 2103   GLUCOSE 106* 12/29/2014 2103   BUN 22* 12/29/2014 2103   CREATININE 1.66* 12/29/2014 2103   CREATININE 1.6* 07/20/2014   CREATININE 1.37* 09/28/2013 1146   CALCIUM 9.6 12/29/2014 2103   PROT 6.2* 12/29/2014 2103   ALBUMIN 3.3* 12/29/2014 2103   AST 24 12/29/2014 2103     ALT 18 12/29/2014 2103   ALKPHOS 46 12/29/2014 2103   BILITOT 1.0 12/29/2014 2103   GFRNONAA 43* 12/29/2014 2103   GFRAA 50* 12/29/2014 2103     Recent Labs Lab 12/29/14 2103  LIPASE 20*    No results for input(s): CKTOTAL, CKMB, CKMBINDEX, TROPONINI in the last 168 hours. BNP (last 3 results) No results for input(s): BNP in the last 8760 hours.  ProBNP (last 3 results) No results for input(s): PROBNP in the last 8760 hours.   Radiological Exams on Admission: Dg Chest Port 1 View  12/29/2014   CLINICAL DATA:  Chest pain and sore throat tonight  EXAM: PORTABLE CHEST - 1 VIEW  COMPARISON:  None.  FINDINGS: Shallow inspiration. Normal heart size and pulmonary vascularity. Linear atelectasis in the lung bases. No focal airspace disease or consolidation.  No blunting of costophrenic angles. No pneumothorax.  IMPRESSION: Shallow inspiration with atelectasis in the lung bases.   Electronically Signed   By: Lucienne Capers M.D.   On: 12/29/2014 21:38   EKG: Independently reviewed. normal sinus rhythm, nonspecific ST and T waves changes.  Assessment/Plan Principal Problem:   Chest pain Active Problems:   Type 2 diabetes mellitus, controlled   DOWNS SYNDROME   1. Chest pain The patient is presenting with compressive chest pain. The time of my evaluation he is chest pain-free. EKG initial troponins are negative. Chest x-ray is also negative. We'll continue to monitor serial troponins. Monitor on telemetry echogram in the morning.  2. Left arm shaking. The patient did initially started with shaking of his left arm and was confused. The time of my evaluation his back to his baseline. Does not have any focal deficit. Monitor closely monitor.  3. History of hypertension. Borderline blood pressure. Currently holding his hypertensive medications.  4. Facial rash. Continue clindamycin gel as per home.  5. Diabetes mellitus with chronic kidney disease. Avoiding nephrotoxic  medications. Continuing Flomax. Placing him on sliding scale. Holding oral hypoglycemic agents.  Advance goals of care discussion: Full code as per my discussion with the caregiver   DVT Prophylaxis: subcutaneous Heparin Nutrition: npo  Family Communication: caregiver was present at bedside, opportunity was given to ask question and all questions were answered satisfactorily at the time of interview. Disposition: Admitted as observation, telemetry unit.  Author: Berle Mull, MD [facility] Pager: [phone] 12/29/2014  If 7PM-7AM, please contact night-coverage [email or web address] Password [email or web address]

## 2014-12-29 NOTE — ED Notes (Signed)
Phlebotomy at the bedside  

## 2014-12-29 NOTE — ED Notes (Signed)
Portable x-ray at the bedside.  

## 2014-12-29 NOTE — ED Notes (Addendum)
Patient is from a group home [facility], patient is diabetic, severe sleep apnea. bp 110/70, p 74, rr 16, cbg 115 with ems. 2 nitro and 324mg  of aspirin given. 22g in right hand. Right branch block on ekg.

## 2014-12-30 ENCOUNTER — Encounter (HOSPITAL_COMMUNITY): Payer: Self-pay | Admitting: *Deleted

## 2014-12-30 ENCOUNTER — Observation Stay (HOSPITAL_COMMUNITY): Payer: Medicare Other

## 2014-12-30 DIAGNOSIS — R072 Precordial pain: Secondary | ICD-10-CM

## 2014-12-30 DIAGNOSIS — R079 Chest pain, unspecified: Secondary | ICD-10-CM | POA: Diagnosis not present

## 2014-12-30 DIAGNOSIS — R0789 Other chest pain: Secondary | ICD-10-CM | POA: Diagnosis not present

## 2014-12-30 LAB — CBC WITH DIFFERENTIAL/PLATELET
BASOS ABS: 0.1 10*3/uL (ref 0.0–0.1)
Basophils Relative: 2 % — ABNORMAL HIGH (ref 0–1)
Eosinophils Absolute: 0 10*3/uL (ref 0.0–0.7)
Eosinophils Relative: 1 % (ref 0–5)
HCT: 35.7 % — ABNORMAL LOW (ref 39.0–52.0)
Hemoglobin: 12.4 g/dL — ABNORMAL LOW (ref 13.0–17.0)
LYMPHS PCT: 32 % (ref 12–46)
Lymphs Abs: 1.3 10*3/uL (ref 0.7–4.0)
MCH: 33.3 pg (ref 26.0–34.0)
MCHC: 34.7 g/dL (ref 30.0–36.0)
MCV: 96 fL (ref 78.0–100.0)
MONO ABS: 0.7 10*3/uL (ref 0.1–1.0)
MONOS PCT: 19 % — AB (ref 3–12)
Neutro Abs: 1.9 10*3/uL (ref 1.7–7.7)
Neutrophils Relative %: 46 % (ref 43–77)
Platelets: 192 10*3/uL (ref 150–400)
RBC: 3.72 MIL/uL — AB (ref 4.22–5.81)
RDW: 14.5 % (ref 11.5–15.5)
WBC: 4 10*3/uL (ref 4.0–10.5)

## 2014-12-30 LAB — COMPREHENSIVE METABOLIC PANEL
ALBUMIN: 3.3 g/dL — AB (ref 3.5–5.0)
ALT: 18 U/L (ref 17–63)
ANION GAP: 11 (ref 5–15)
AST: 24 U/L (ref 15–41)
Alkaline Phosphatase: 44 U/L (ref 38–126)
BUN: 24 mg/dL — ABNORMAL HIGH (ref 6–20)
CHLORIDE: 104 mmol/L (ref 101–111)
CO2: 25 mmol/L (ref 22–32)
Calcium: 9 mg/dL (ref 8.9–10.3)
Creatinine, Ser: 1.59 mg/dL — ABNORMAL HIGH (ref 0.61–1.24)
GFR calc Af Amer: 52 mL/min — ABNORMAL LOW (ref >60)
GFR calc non Af Amer: 45 mL/min — ABNORMAL LOW (ref >60)
GLUCOSE: 72 mg/dL (ref 65–99)
POTASSIUM: 4.3 mmol/L (ref 3.5–5.1)
Sodium: 140 mmol/L (ref 135–145)
Total Bilirubin: 1 mg/dL (ref 0.3–1.2)
Total Protein: 5.7 g/dL — ABNORMAL LOW (ref 6.5–8.1)

## 2014-12-30 LAB — D-DIMER, QUANTITATIVE (NOT AT ARMC): D DIMER QUANT: 0.35 ug{FEU}/mL (ref 0.00–0.48)

## 2014-12-30 LAB — PROTIME-INR
INR: 1.05 (ref 0.00–1.49)
Prothrombin Time: 13.8 seconds (ref 11.6–15.2)

## 2014-12-30 LAB — TROPONIN I
Troponin I: <0.03 ng/mL (ref <0.031)
Troponin I: <0.03 ng/mL (ref <0.031)
Troponin I: <0.03 ng/mL (ref <0.031)

## 2014-12-30 LAB — GLUCOSE, CAPILLARY
GLUCOSE-CAPILLARY: 73 mg/dL (ref 65–99)
GLUCOSE-CAPILLARY: 113 mg/dL — AB (ref 65–99)
Glucose-Capillary: 95 mg/dL (ref 65–99)
Glucose-Capillary: 71 mg/dL (ref 65–99)
Glucose-Capillary: 82 mg/dL (ref 65–99)

## 2014-12-30 MED ORDER — ASPIRIN EC 81 MG PO TBEC
81.0000 mg | DELAYED_RELEASE_TABLET | Freq: Every day | ORAL | Status: DC
  Administered 2014-12-30: 81 mg via ORAL
  Filled 2014-12-30 (×2): qty 1

## 2014-12-30 MED ORDER — ALLOPURINOL 100 MG PO TABS
100.0000 mg | ORAL_TABLET | Freq: Every day | ORAL | Status: DC
  Administered 2014-12-30: 100 mg via ORAL
  Filled 2014-12-30 (×2): qty 1

## 2014-12-30 MED ORDER — PRAVASTATIN SODIUM 20 MG PO TABS
20.0000 mg | ORAL_TABLET | Freq: Every day | ORAL | Status: DC
  Administered 2014-12-30: 20 mg via ORAL
  Filled 2014-12-30 (×2): qty 1

## 2014-12-30 MED ORDER — FAMOTIDINE IN NACL 20-0.9 MG/50ML-% IV SOLN
20.0000 mg | Freq: Once | INTRAVENOUS | Status: AC
  Administered 2014-12-30: 20 mg via INTRAVENOUS
  Filled 2014-12-30: qty 50

## 2014-12-30 MED ORDER — ALBUTEROL SULFATE (2.5 MG/3ML) 0.083% IN NEBU
2.5000 mg | INHALATION_SOLUTION | Freq: Two times a day (BID) | RESPIRATORY_TRACT | Status: DC
  Administered 2014-12-30 – 2014-12-31 (×3): 2.5 mg via RESPIRATORY_TRACT
  Filled 2014-12-30 (×3): qty 3

## 2014-12-30 MED ORDER — HEPARIN SODIUM (PORCINE) 5000 UNIT/ML IJ SOLN
5000.0000 [IU] | Freq: Three times a day (TID) | INTRAMUSCULAR | Status: DC
  Administered 2014-12-30 – 2014-12-31 (×5): 5000 [IU] via SUBCUTANEOUS
  Filled 2014-12-30 (×8): qty 1

## 2014-12-30 MED ORDER — INSULIN ASPART 100 UNIT/ML ~~LOC~~ SOLN
0.0000 [IU] | Freq: Four times a day (QID) | SUBCUTANEOUS | Status: DC
Start: 2014-12-30 — End: 2014-12-31

## 2014-12-30 MED ORDER — CLINDAMYCIN PHOSPHATE 1 % EX GEL: 1.0000 "application " | Freq: Two times a day (BID) | CUTANEOUS | Status: DC | PRN

## 2014-12-30 MED ORDER — OXYBUTYNIN CHLORIDE 5 MG PO TABS
5.0000 mg | ORAL_TABLET | Freq: Every day | ORAL | Status: DC
  Administered 2014-12-30: 5 mg via ORAL
  Filled 2014-12-30 (×2): qty 1

## 2014-12-30 MED ORDER — ONDANSETRON HCL 4 MG/2ML IJ SOLN: 4.0000 mg | Freq: Four times a day (QID) | INTRAMUSCULAR | Status: DC | PRN

## 2014-12-30 MED ORDER — FAMOTIDINE 20 MG PO TABS
20.0000 mg | ORAL_TABLET | Freq: Every day | ORAL | Status: DC
  Administered 2014-12-30: 20 mg via ORAL
  Filled 2014-12-30 (×2): qty 1

## 2014-12-30 MED ORDER — ACETAMINOPHEN 325 MG PO TABS: 650.0000 mg | ORAL_TABLET | ORAL | Status: DC | PRN

## 2014-12-30 MED ORDER — TAMSULOSIN HCL 0.4 MG PO CAPS
0.4000 mg | ORAL_CAPSULE | Freq: Every day | ORAL | Status: DC
  Administered 2014-12-30: 0.4 mg via ORAL
  Filled 2014-12-30 (×2): qty 1

## 2014-12-30 MED ORDER — ALBUTEROL SULFATE (2.5 MG/3ML) 0.083% IN NEBU
2.5000 mg | INHALATION_SOLUTION | Freq: Two times a day (BID) | RESPIRATORY_TRACT | Status: DC
  Filled 2014-12-30: qty 3

## 2014-12-30 MED ORDER — DONEPEZIL HCL 5 MG PO TABS
5.0000 mg | ORAL_TABLET | Freq: Every day | ORAL | Status: DC
  Administered 2014-12-30 (×2): 5 mg via ORAL
  Filled 2014-12-30 (×3): qty 1

## 2014-12-30 MED ORDER — FLUTICASONE PROPIONATE 50 MCG/ACT NA SUSP
2.0000 | Freq: Every day | NASAL | Status: DC
  Filled 2014-12-30 (×2): qty 16

## 2014-12-30 NOTE — Progress Notes (Signed)
  Echocardiogram 2D Echocardiogram has been performed.  [name] [name] 12/30/2014, 2:37 PM

## 2014-12-30 NOTE — ED Notes (Signed)
Bedside report given to [place] BridgeM[place], RN, on [facility].

## 2014-12-30 NOTE — Progress Notes (Signed)
Pt is from [facility].  CSW spoke with Tresa EndoKelly (the RN at the facility) and confirmed pt is long term resident there.  Facility #- [id number] Tresa Endo(Kelly is ext [phone]) Fax: [phone]  Group home will provide transport at time of DC.  CSW will continue to follow.  Merlyn LotJenna Holoman, LCSWA Clinical Social Worker [phone]

## 2014-12-30 NOTE — Consult Note (Signed)
CARDIOLOGY CONSULT NOTE  Patient ID: NASHAUN HILLMER MRN: [id number] DOB/AGE: [birth year] 64 y.o.  Admit date: 12/29/2014 Primary Physician Dr. Lillia Corporal Primary Cardiologist None Chief Complaint  Chest pain  HPI:  The patient presents with chest pain. He's unable to give very much history because of his Down syndrome. I did talk to his supervisor by phone who was with him. They said he really started having some arm shaking. This was his left arm. He then started to complain of some chest pain. He is able to tell me he's had some indigestion pain. He's not able to give me any further details about it. It's not clear whether he is having pain now. He looks comfortable. He's had no prior cardiac history that I can find.  His enzyme has been negative x 1.  EKG did not demonstrate acute changes. An echocardiogram has been ordered.  Of note no further past medical social or family history was available from his residential home and the patient is unable to provide this. There were no notes that I could find sent from his place of residence.   Past Medical History  Diagnosis Date  . Down syndrome   . Diabetes mellitus without complication     History reviewed. No pertinent past surgical history.  No Known Allergies Prescriptions prior to admission  Medication Sig Dispense Refill Last Dose  . acetaminophen (TYLENOL) 500 MG tablet Take 500 mg by mouth 2 (two) times daily.    12/29/2014 at Unknown time  . albuterol (PROVENTIL) (2.5 MG/3ML) 0.083% nebulizer solution Take 2.5 mg by nebulization 2 (two) times daily.    12/29/2014 at Unknown time  . allopurinol (ZYLOPRIM) 100 MG tablet Take 100 mg by mouth daily.    12/29/2014 at Unknown time  . aspirin 81 MG tablet Take 81 mg by mouth daily.   12/29/2014 at Unknown time  . Calcium Carbonate-Vitamin D (CALCIUM + D PO) Take 1 tablet by mouth daily.    12/29/2014 at Unknown time  . clindamycin (CLINDAGEL) 1 % gel Apply 1 application topically as  needed (for irritation).    prn  . [facility] MELATONIN 3 MG TABS Take 3 mg by mouth at bedtime.   0 12/28/2014 at Unknown time  . donepezil (ARICEPT) 5 MG tablet Take 5 mg by mouth at bedtime.    12/28/2014 at Unknown time  . fluticasone (FLONASE) 50 MCG/ACT nasal spray Place 2 sprays into both nostrils as needed for allergies.    prn  . lactulose (CHRONULAC) 10 GM/15ML solution Take 10 g by mouth daily as needed for moderate constipation.    prn  . losartan (COZAAR) 25 MG tablet Take 25 mg by mouth daily.    12/29/2014 at Unknown time  . mineral oil-hydrophilic petrolatum (AQUAPHOR) ointment Apply 1 application topically 2 (two) times daily.   12/29/2014 at Unknown time  . oxybutynin (DITROPAN) 5 MG tablet Take 5 mg by mouth daily.    12/29/2014 at Unknown time  . pioglitazone-metformin (ACTOPLUS MET) 15-850 MG per tablet Take 1 tablet by mouth 2 (two) times daily with a meal.    12/29/2014 at Unknown time  . pravastatin (PRAVACHOL) 20 MG tablet Take 20 mg by mouth daily.    12/29/2014 at Unknown time  . ranitidine (ZANTAC) 150 MG tablet Take 150 mg by mouth daily.    12/29/2014 at Unknown time  . tamsulosin (FLOMAX) 0.4 MG CAPS capsule Take 0.4 mg by mouth daily.    12/29/2014 at Unknown  time  . VOLTAREN 1 % GEL Apply 2 g topically as needed (for shoulder pain).    12/29/2014 at Unknown time  . HYDROcodone-homatropine (HYCODAN) 5-1.5 MG/5ML syrup Take 5 mLs by mouth every 4 (four) hours as needed for cough. 120 mL 0 Taking  . ibuprofen (ADVIL,MOTRIN) 400 MG tablet Take 1 tablet (400 mg total) by mouth every 8 (eight) hours as needed. 30 tablet 0 Taking   History reviewed. No pertinent family history.   (Unable to obtain)  History   Social History  . Marital Status: Single    Spouse Name: N/A  . Number of Children: N/A  . Years of Education: N/A   Occupational History  . Not on file.   Social History Main Topics  . Smoking status: Never Smoker   . Smokeless tobacco: Never Used  . Alcohol Use: No  .  Drug Use: Not on file  . Sexual Activity: Not on file   Other Topics Concern  . Not on file   Social History Narrative  Lives in a residential home.  He does have a brother.  He had a DSS guardian.   ROS:  Unable to obtain secondary to patient's difficulty with communication  Physical Exam: Blood pressure 114/52, pulse 46, temperature 97.9 F (36.6 C), temperature source Axillary, resp. rate 18, height 5' (1.524 m), weight 150 lb 3.2 oz (68.13 kg), SpO2 99 %.  GENERAL:  Well appearing HEENT:  Pupils equal round and reactive, fundi not visualized, oral mucosa unremarkable NECK:  No jugular venous distention, waveform within normal limits, carotid upstroke brisk and symmetric, no bruits, no thyromegaly LYMPHATICS:  No cervical, inguinal adenopathy LUNGS:  Clear to auscultation bilaterally BACK:  No CVA tenderness CHEST:  Unremarkable HEART:  PMI not displaced or sustained,S1 and S2 within normal limits, no S3, no S4, no clicks, no rubs, no murmurs ABD:  Flat, positive bowel sounds normal in frequency in pitch, no bruits, no rebound, no guarding, no midline pulsatile mass, no hepatomegaly, no splenomegaly EXT:  2 plus pulses throughout, no edema, no cyanosis no clubbing SKIN:  No rashes no nodules NEURO:  Cranial nerves II through XII grossly intact, motor grossly intact throughout PSYCH:  Cognitively intact, oriented to person place and time  Labs: Lab Results  Component Value Date   BUN 24* 12/30/2014   Lab Results  Component Value Date   CREATININE 1.59* 12/30/2014   Lab Results  Component Value Date   NA 140 12/30/2014   K 4.3 12/30/2014   CL 104 12/30/2014   CO2 25 12/30/2014   Lab Results  Component Value Date   TROPONINI <0.03 12/30/2014   Lab Results  Component Value Date   WBC 4.0 12/30/2014   HGB 12.4* 12/30/2014   HCT 35.7* 12/30/2014   MCV 96.0 12/30/2014   PLT 192 12/30/2014    Lab Results  Component Value Date   ALT 18 12/30/2014   AST 24  12/30/2014   ALKPHOS 44 12/30/2014   BILITOT 1.0 12/30/2014     Radiology:   CXR: Shallow inspiration. Normal heart size and pulmonary vascularity. Linear atelectasis in the lung bases. No focal airspace disease or consolidation. No blunting of costophrenic angles. No pneumothorax.  EKG:NSR, rate 64, RBBB, artifact, no acute ST T wave changes.  ASSESSMENT AND PLAN:   CHEST PAIN:  Unable to quantify or qualify his symptoms. He does have significant risk factors. Therefore stress testing would be indicated. He would not be a walker treadmill. I will  order a I will order a Lexiscan Myoview.  HTN:  BP is well controlled.  Continue current therapy.    DM:    A1C is 5.9.  Plans per primary team and Dr. Dwyane Dee.      SignedMinus Breeding 12/30/2014, 10:18 AM

## 2014-12-30 NOTE — Progress Notes (Addendum)
[facility] PROGRESS NOTE  [name] LiasJohn W [name] ZOX:[id number] DOB: Nov 10, 1950 DOA: 12/29/2014 PCP: No PCP Per Patient  Assessment/Plan: Principal Problem:   Chest pain Active Problems:   Type 2 diabetes mellitus, controlled   DOWNS SYNDROME   CHEST PAIN: Unable to quantify or qualify his symptoms. He does have significant risk factors.  stress testing would be indicated. He would not be a walker treadmill. Cardiology [facility]. D-dimer negative  HTN: BP is well controlled. Continue current therapy.   DM: A1C is 5.9.Continue sliding scale insulin, Dr. Lucianne MussKumar.   Facial rash continue clindamycin gel  Code Status: full Family Communication: family updated about patient's clinical progress Disposition Plan:  PT/OT eval   Brief narrative: He's unable to give very much history because of his Down syndrome. I did talk to his supervisor by phone who was with him. They said he really started having some arm shaking. This was his left arm. He then started to complain of some chest pain. He is able to tell me he's had some indigestion pain. He's not able to give me any further details about it. It's not clear whether he is having pain now. He looks comfortable. He's had no prior cardiac history that I can find. His enzyme has been negative x 1. EKG did not demonstrate acute changes. An echocardiogram has been ordered.  Of note no further past medical social or family history was available from his residential home and the patient is unable to provide this. There were no notes that I could find sent from his place of residence.  Consultants:  cardiology  Procedures:  None     Antibiotics: None   HPI/Subjective: Awake but unable to provide much hx  because of Down syndrome  Objective: Filed Vitals:   12/30/14 0100 12/30/14 0404 12/30/14 0614 12/30/14 0950  BP:   114/52   Pulse:  59 46   Temp:   97.9 F (36.6 C)   TempSrc:   Axillary   Resp:  16 18    Height: 5' (1.524 m)     Weight: 68.13 kg (150 lb 3.2 oz)     SpO2:  100% 98% 99%    Intake/Output Summary (Last 24 hours) at 12/30/14 1122 Last data filed at 12/30/14 0900  Gross per 24 hour  Intake   1000 ml  Output    600 ml  Net    400 ml    Exam:  General: No acute respiratory distress Lungs: Clear to auscultation bilaterally without wheezes or crackles Cardiovascular: Regular rate and rhythm without murmur gallop or rub normal S1 and S2 Abdomen: Nontender, nondistended, soft, bowel sounds positive, no rebound, no ascites, no appreciable mass Extremities: No significant cyanosis, clubbing, or edema bilateral lower extremities      Data Reviewed: Basic Metabolic Panel:  Recent Labs Lab 12/29/14 2103 12/30/14 0633  NA 142 140  K 4.1 4.3  CL 102 104  CO2 31 25  GLUCOSE 106* 72  BUN 22* 24*  CREATININE 1.66* 1.59*  CALCIUM 9.6 9.0    Liver Function Tests:  Recent Labs Lab 12/29/14 2103 12/30/14 0633  AST 24 24  ALT 18 18  ALKPHOS 46 44  BILITOT 1.0 1.0  PROT 6.2* 5.7*  ALBUMIN 3.3* 3.3*    Recent Labs Lab 12/29/14 2103  LIPASE 20*   No results for input(s): AMMONIA in the last 168 hours.  CBC:  Recent Labs Lab 12/29/14 2103 12/30/14 0633  WBC 4.8 4.0  NEUTROABS  --  1.9  HGB 11.6* 12.4*  HCT 34.7* 35.7*  MCV 98.0 96.0  PLT 185 192    Cardiac Enzymes:  Recent Labs Lab 12/30/14 0633  TROPONINI <0.03   BNP (last 3 results) No results for input(s): BNP in the last 8760 hours.  ProBNP (last 3 results) No results for input(s): PROBNP in the last 8760 hours.    CBG:  Recent Labs Lab 12/30/14 0110 12/30/14 0629 12/30/14 0738  GLUCAP 95 71 82    No results found for this or any previous visit (from the past 240 hour(s)).   Studies: Dg Chest Port 1 View  12/29/2014   CLINICAL DATA:  Chest pain and sore throat tonight  EXAM: PORTABLE CHEST - 1 VIEW  COMPARISON:  None.  FINDINGS: Shallow inspiration. Normal heart size  and pulmonary vascularity. Linear atelectasis in the lung bases. No focal airspace disease or consolidation. No blunting of costophrenic angles. No pneumothorax.  IMPRESSION: Shallow inspiration with atelectasis in the lung bases.   Electronically Signed   By: Burman NievesWilliam  Stevens M.D.   On: 12/29/2014 21:38    Scheduled Meds: . albuterol  2.5 mg Nebulization BID  . allopurinol  100 mg Oral Daily  . aspirin EC  81 mg Oral Daily  . donepezil  5 mg Oral QHS  . famotidine  20 mg Oral Daily  . fluticasone  2 spray Each Nare Daily  . heparin  5,000 Units Subcutaneous 3 times per day  . insulin aspart  0-9 Units Subcutaneous Q6H  . oxybutynin  5 mg Oral Daily  . pravastatin  20 mg Oral q1800  . tamsulosin  0.4 mg Oral Daily   Continuous Infusions:   Principal Problem:   Chest pain Active Problems:   Type 2 diabetes mellitus, controlled   DOWNS SYNDROME    Time spent: 40 minutes   Mariners HospitalBROL,[name] [name]  [facility] Pager [phone]. If 7PM-7AM, please contact night-coverage at [email or web address], password [email or web address] 12/30/2014, 11:22 AM

## 2014-12-30 NOTE — Clinical Social Work Note (Signed)
Clinical Social Work Assessment  Patient Details  Name: [name] [name] MRN: [id number] Date of Birth: 19-Oct-1950  Date of referral:  12/30/14               Reason for consult:  Facility Placement                Permission sought to share information with:  Facility Industrial/product designerContact Representative Permission granted to share information::  No (Pt is MR- facility is his main contact)  Name::     Primary school teacherKelly  Agency::  [facility]  Relationship::     Contact Information:     Housing/Transportation Living arrangements for the past 2 months:  Group Home Source of Information:  Facility Patient Interpreter Needed:  None Criminal Activity/Legal Involvement Pertinent to Current Situation/Hospitalization:  No - Comment as needed Significant Relationships:  Other(Comment) (facility staff) Lives with:  Facility Resident Do you feel safe going back to the place where you live?  Yes Need for family participation in patient care:  No (Coment)  Care giving concerns:  none   Office managerocial Worker assessment / plan:  Spoke with patient facility concerning his return to group home- group home is on board to accept patient back when medically stable  Employment status:  Disabled (Comment on whether or not currently receiving Disability) (MR) Insurance information:  Medicare PT Recommendations:  Not assessed at this time Information / Referral to community resources:  Other (Comment Required) (none)  Patient/Family's Response to care:  Facility rep is agreeable to pt return to [facility] at time of DC  Patient/Family's Understanding of and Emotional Response to Diagnosis, Current Treatment, and Prognosis:  Not assessed  Emotional Assessment Appearance:  Other (Comment Required (did not visit with patient) Attitude/Demeanor/Rapport:  Unable to Assess Affect (typically observed):  Unable to Assess Orientation:  Oriented to Self, Oriented to Place, Oriented to  Time, Oriented to Situation Alcohol / Substance use:  Not  Applicable Psych involvement (Current and /or in the community):  No (Comment)  Discharge Needs  Concerns to be addressed:  No discharge needs identified Readmission within the last 30 days:  No Current discharge risk:  None Barriers to Discharge:  Continued Medical Work up   [name] [name] M, LCSW 12/30/2014, 12:21 PM

## 2014-12-31 ENCOUNTER — Observation Stay (HOSPITAL_COMMUNITY): Payer: Medicare Other

## 2014-12-31 DIAGNOSIS — Q909 Down syndrome, unspecified: Secondary | ICD-10-CM | POA: Diagnosis not present

## 2014-12-31 DIAGNOSIS — R072 Precordial pain: Secondary | ICD-10-CM

## 2014-12-31 DIAGNOSIS — R079 Chest pain, unspecified: Secondary | ICD-10-CM | POA: Diagnosis not present

## 2014-12-31 DIAGNOSIS — R0789 Other chest pain: Secondary | ICD-10-CM | POA: Diagnosis not present

## 2014-12-31 DIAGNOSIS — E119 Type 2 diabetes mellitus without complications: Secondary | ICD-10-CM | POA: Diagnosis not present

## 2014-12-31 LAB — COMPREHENSIVE METABOLIC PANEL
ALT: 15 U/L — ABNORMAL LOW (ref 17–63)
AST: 31 U/L (ref 15–41)
Albumin: 3.3 g/dL — ABNORMAL LOW (ref 3.5–5.0)
Alkaline Phosphatase: 47 U/L (ref 38–126)
Anion gap: 9 (ref 5–15)
BUN: 25 mg/dL — ABNORMAL HIGH (ref 6–20)
CHLORIDE: 105 mmol/L (ref 101–111)
CO2: 26 mmol/L (ref 22–32)
Calcium: 8.9 mg/dL (ref 8.9–10.3)
Creatinine, Ser: 1.82 mg/dL — ABNORMAL HIGH (ref 0.61–1.24)
GFR calc Af Amer: 45 mL/min — ABNORMAL LOW (ref >60)
GFR calc non Af Amer: 38 mL/min — ABNORMAL LOW (ref >60)
GLUCOSE: 94 mg/dL (ref 65–99)
POTASSIUM: 4.3 mmol/L (ref 3.5–5.1)
SODIUM: 140 mmol/L (ref 135–145)
TOTAL PROTEIN: 6.1 g/dL — AB (ref 6.5–8.1)
Total Bilirubin: 1.7 mg/dL — ABNORMAL HIGH (ref 0.3–1.2)

## 2014-12-31 LAB — NM MYOCAR MULTI W/SPECT W/WALL MOTION / EF
CHL CUP NUCLEAR SDS: 3
LV dias vol: 56 mL
LV sys vol: 23 mL
SRS: 7
SSS: 10
TID: 0.69

## 2014-12-31 LAB — CBC
HCT: 37 % — ABNORMAL LOW (ref 39.0–52.0)
HEMOGLOBIN: 12.5 g/dL — AB (ref 13.0–17.0)
MCH: 32.6 pg (ref 26.0–34.0)
MCHC: 33.8 g/dL (ref 30.0–36.0)
MCV: 96.4 fL (ref 78.0–100.0)
PLATELETS: 201 10*3/uL (ref 150–400)
RBC: 3.84 MIL/uL — ABNORMAL LOW (ref 4.22–5.81)
RDW: 14.4 % (ref 11.5–15.5)
WBC: 5.2 10*3/uL (ref 4.0–10.5)

## 2014-12-31 LAB — GLUCOSE, CAPILLARY
GLUCOSE-CAPILLARY: 93 mg/dL (ref 65–99)
Glucose-Capillary: 94 mg/dL (ref 65–99)

## 2014-12-31 MED ORDER — TECHNETIUM TC 99M SESTAMIBI - CARDIOLITE
30.0000 | Freq: Once | INTRAVENOUS | Status: AC | PRN
  Administered 2014-12-31: 30 via INTRAVENOUS

## 2014-12-31 MED ORDER — REGADENOSON 0.4 MG/5ML IV SOLN
INTRAVENOUS | Status: AC
  Administered 2014-12-31: 13:00:00
  Filled 2014-12-31: qty 5

## 2014-12-31 MED ORDER — TECHNETIUM TC 99M SESTAMIBI GENERIC - CARDIOLITE
10.0000 | Freq: Once | INTRAVENOUS | Status: AC | PRN
  Administered 2014-12-31: 10 via INTRAVENOUS

## 2014-12-31 MED ORDER — REGADENOSON 0.4 MG/5ML IV SOLN
0.4000 mg | Freq: Once | INTRAVENOUS | Status: AC
  Administered 2014-12-31: 0.4 mg via INTRAVENOUS
  Filled 2014-12-31: qty 5

## 2014-12-31 NOTE — Progress Notes (Signed)
DC IV and tele per MD orders and protocol; DC patient back to group home.  Hermina BartersBOWMAN, Kentley Blyden M, RN

## 2014-12-31 NOTE — Progress Notes (Signed)
Subjective:  Awake, no pain.   Objective:  Vital Signs in the last 24 hours: Temp:  [97.6 F (36.4 C)-97.8 F (36.6 C)] 97.7 F (36.5 C) (05/13 0547) Pulse Rate:  [51-76] 53 (05/13 0547) Resp:  [18] 18 (05/13 0547) BP: (87-126)/(45-87) 87/45 mmHg (05/13 0547) SpO2:  [93 %-99 %] 93 % (05/13 0547)  Intake/Output from previous day: 05/12 0701 - 05/13 0700 In: 1122 [P.O.:1122] Out: 2050 [Urine:2050]   Physical Exam: General: Well developed, well nourished, in no acute distress. Head:  Normocephalic and atraumatic. Downs syndrome Lungs: Clear to auscultation and percussion. Heart: Normal S1 and S2.  No murmur, rubs or gallops.  Abdomen: soft, non-tender, positive bowel sounds. Extremities: No clubbing or cyanosis. No edema. Neurologic: Alert and oriented x 3.    Lab Results:  Recent Labs  12/30/14 0633 12/31/14 0339  WBC 4.0 5.2  HGB 12.4* 12.5*  PLT 192 201    Recent Labs  12/30/14 0633 12/31/14 0339  NA 140 140  K 4.3 4.3  CL 104 105  CO2 25 26  GLUCOSE 72 94  BUN 24* 25*  CREATININE 1.59* 1.82*    Recent Labs  12/30/14 1200 12/30/14 2057  TROPONINI <0.03 <0.03   Hepatic Function Panel  Recent Labs  12/29/14 2103  12/31/14 0339  PROT 6.2*  < > 6.1*  ALBUMIN 3.3*  < > 3.3*  AST 24  < > 31  ALT 18  < > 15*  ALKPHOS 46  < > 47  BILITOT 1.0  < > 1.7*  BILIDIR 0.2  --   --   IBILI 0.8  --   --   < > = values in this interval not displayed. No results for input(s): CHOL in the last 72 hours. No results for input(s): PROTIME in the last 72 hours.  Imaging: Dg Chest Port 1 View  12/29/2014   CLINICAL DATA:  Chest pain and sore throat tonight  EXAM: PORTABLE CHEST - 1 VIEW  COMPARISON:  None.  FINDINGS: Shallow inspiration. Normal heart size and pulmonary vascularity. Linear atelectasis in the lung bases. No focal airspace disease or consolidation. No blunting of costophrenic angles. No pneumothorax.  IMPRESSION: Shallow inspiration with  atelectasis in the lung bases.   Electronically Signed   By: Burman NievesWilliam  Stevens M.D.   On: 12/29/2014 21:38   Personally viewed.   Telemetry: occas sinus tachy. Now brady 50'sPersonally viewed.   EKG:  NSR, rate 64, RBBB, artifact, no acute ST T wave changes.  Cardiac Studies:  ECHO: EF 55%, mild AR/MR  Scheduled Meds: . albuterol  2.5 mg Nebulization BID  . allopurinol  100 mg Oral Daily  . aspirin EC  81 mg Oral Daily  . donepezil  5 mg Oral QHS  . famotidine  20 mg Oral Daily  . fluticasone  2 spray Each Nare Daily  . heparin  5,000 Units Subcutaneous 3 times per day  . insulin aspart  0-9 Units Subcutaneous Q6H  . oxybutynin  5 mg Oral Daily  . pravastatin  20 mg Oral q1800  . tamsulosin  0.4 mg Oral Daily   Continuous Infusions:  PRN Meds:.acetaminophen, clindamycin, ondansetron (ZOFRAN) IV  Assessment/Plan:  Principal Problem:   Chest pain Active Problems:   Type 2 diabetes mellitus, controlled   DOWNS SYNDROME   CHEST PAIN: Unable to quantify or qualify his symptoms. He does have significant risk factors. Therefore stress testing would be indicated. He would not be a walker treadmill. Lexiscan  Myoview. ECHO reassuring.  HTN: BP is well controlled. (low at times) Continue current therapy.   DM: A1C is 5.9. Plans per primary team and Dr. Lucianne MussKumar.   [name], [name] 12/31/2014, 8:20 AM

## 2014-12-31 NOTE — Discharge Summary (Addendum)
Physician Discharge Summary  [name] [name] MRN: [id number] DOB/AGE: 05-10-1951 64 y.o.  PCP: No PCP Per Patient   Admit date: 12/29/2014 Discharge date: 12/31/2014  Discharge Diagnoses:     Principal Problem:   Chest pain Active Problems:   Type 2 diabetes mellitus, controlled   DOWNS SYNDROME  Follow-up recommendations In follow-up with PCP in 3-5 days Cbc ,cmp in one week     Medication List    STOP taking these medications        HYDROcodone-homatropine 5-1.5 MG/5ML syrup  Commonly known as:  HYCODAN      TAKE these medications        acetaminophen 500 MG tablet  Commonly known as:  TYLENOL  Take 500 mg by mouth 2 (two) times daily.     albuterol (2.5 MG/3ML) 0.083% nebulizer solution  Commonly known as:  PROVENTIL  Take 2.5 mg by nebulization 2 (two) times daily.     allopurinol 100 MG tablet  Commonly known as:  ZYLOPRIM  Take 100 mg by mouth daily.     aspirin 81 MG tablet  Take 81 mg by mouth daily.     CALCIUM + D PO  Take 1 tablet by mouth daily.     clindamycin 1 % gel  Commonly known as:  CLINDAGEL  Apply 1 application topically as needed (for irritation).     [employer] MELATONIN 3 MG Tabs  Generic drug:  Melatonin  Take 3 mg by mouth at bedtime.     donepezil 5 MG tablet  Commonly known as:  ARICEPT  Take 5 mg by mouth at bedtime.     fluticasone 50 MCG/ACT nasal spray  Commonly known as:  FLONASE  Place 2 sprays into both nostrils as needed for allergies.     ibuprofen 400 MG tablet  Commonly known as:  ADVIL,MOTRIN  Take 1 tablet (400 mg total) by mouth every 8 (eight) hours as needed.     lactulose 10 GM/15ML solution  Commonly known as:  CHRONULAC  Take 10 g by mouth daily as needed for moderate constipation.         mineral oil-hydrophilic petrolatum ointment  Apply 1 application topically 2 (two) times daily.     oxybutynin 5 MG tablet  Commonly known as:  DITROPAN  Take 5 mg by mouth daily.     pioglitazone-metformin  15-850 MG per tablet  Commonly known as:  ACTOPLUS MET  Take 1 tablet by mouth 2 (two) times daily with a meal.     pravastatin 20 MG tablet  Commonly known as:  PRAVACHOL  Take 20 mg by mouth daily.     ranitidine 150 MG tablet  Commonly known as:  ZANTAC  Take 150 mg by mouth daily.     tamsulosin 0.4 MG Caps capsule  Commonly known as:  FLOMAX  Take 0.4 mg by mouth daily.     VOLTAREN 1 % Gel  Generic drug:  diclofenac sodium  Apply 2 g topically as needed (for shoulder pain).        Discharge Condition: Stable   Disposition: 01-Home or Self Care   Consults: Cardiology  Significant Diagnostic Studies: Dg Chest Port 1 View  12/29/2014   CLINICAL DATA:  Chest pain and sore throat tonight  EXAM: PORTABLE CHEST - 1 VIEW  COMPARISON:  None.  FINDINGS: Shallow inspiration. Normal heart size and pulmonary vascularity. Linear atelectasis in the lung bases. No focal airspace disease or consolidation. No blunting of costophrenic  angles. No pneumothorax.  IMPRESSION: Shallow inspiration with atelectasis in the lung bases.   Electronically Signed   By: Lucienne Capers M.D.   On: 12/29/2014 21:38    2-D echo LV EF: 50% -  55%  ------------------------------------------------------------------- Indications:   Chest pain 786.51.  ------------------------------------------------------------------- History:  PMH: Down&'s syndrome. Risk factors: Diabetes mellitus.  ------------------------------------------------------------------- Study Conclusions  - Left ventricle: Abnormal septal motion RWMA hard to assess due to lack of contrast and poor endocardial definition. The cavity size was mildly dilated. There was mild concentric hypertrophy. Systolic function was normal. The estimated ejection fraction was in the range of 50% to 55%. Doppler parameters are consistent with abnormal left ventricular relaxation (grade 1 diastolic dysfunction). - Aortic valve:  There was mild regurgitation. - Mitral valve: There was mild regurgitation. - Atrial septum: No defect or patent foramen ovale was identified.   Microbiology: No results found for this or any previous visit (from the past 240 hour(s)).   Labs: Results for orders placed or performed during the hospital encounter of 12/29/14 (from the past 48 hour(s))  CBC     Status: Abnormal   Collection Time: 12/29/14  9:03 PM  Result Value Ref Range   WBC 4.8 4.0 - 10.5 K/uL   RBC 3.54 (L) 4.22 - 5.81 MIL/uL   Hemoglobin 11.6 (L) 13.0 - 17.0 g/dL   HCT 34.7 (L) 39.0 - 52.0 %   MCV 98.0 78.0 - 100.0 fL   MCH 32.8 26.0 - 34.0 pg   MCHC 33.4 30.0 - 36.0 g/dL   RDW 14.7 11.5 - 15.5 %   Platelets 185 150 - 400 K/uL  Basic metabolic panel     Status: Abnormal   Collection Time: 12/29/14  9:03 PM  Result Value Ref Range   Sodium 142 135 - 145 mmol/L   Potassium 4.1 3.5 - 5.1 mmol/L   Chloride 102 101 - 111 mmol/L   CO2 31 22 - 32 mmol/L   Glucose, Bld 106 (H) 70 - 99 mg/dL   BUN 22 (H) 6 - 20 mg/dL   Creatinine, Ser 1.66 (H) 0.61 - 1.24 mg/dL   Calcium 9.6 8.9 - 10.3 mg/dL   GFR calc non Af Amer 43 (L) >60 mL/min   GFR calc Af Amer 50 (L) >60 mL/min    Comment: (NOTE) The eGFR has been calculated using the CKD EPI equation. This calculation has not been validated in all clinical situations. eGFR's persistently <60 mL/min signify possible Chronic Kidney Disease.    Anion gap 9 5 - 15  Hepatic function panel     Status: Abnormal   Collection Time: 12/29/14  9:03 PM  Result Value Ref Range   Total Protein 6.2 (L) 6.5 - 8.1 g/dL   Albumin 3.3 (L) 3.5 - 5.0 g/dL   AST 24 15 - 41 U/L   ALT 18 17 - 63 U/L   Alkaline Phosphatase 46 38 - 126 U/L   Total Bilirubin 1.0 0.3 - 1.2 mg/dL   Bilirubin, Direct 0.2 0.1 - 0.5 mg/dL   Indirect Bilirubin 0.8 0.3 - 0.9 mg/dL  Lipase, blood     Status: Abnormal   Collection Time: 12/29/14  9:03 PM  Result Value Ref Range   Lipase 20 (L) 22 - 51 U/L   I-stat troponin, ED  (not at [facility], [facility])     Status: None   Collection Time: 12/29/14  9:08 PM  Result Value Ref Range   Troponin i, poc 0.00 0.00 -  0.08 ng/mL   Comment 3            Comment: Due to the release kinetics of cTnI, a negative result within the first hours of the onset of symptoms does not rule out myocardial infarction with certainty. If myocardial infarction is still suspected, repeat the test at appropriate intervals.   Glucose, capillary     Status: None   Collection Time: 12/30/14  1:10 AM  Result Value Ref Range   Glucose-Capillary 95 65 - 99 mg/dL  Glucose, capillary     Status: None   Collection Time: 12/30/14  6:29 AM  Result Value Ref Range   Glucose-Capillary 71 65 - 99 mg/dL  CBC with Differential/Platelet     Status: Abnormal   Collection Time: 12/30/14  6:33 AM  Result Value Ref Range   WBC 4.0 4.0 - 10.5 K/uL   RBC 3.72 (L) 4.22 - 5.81 MIL/uL   Hemoglobin 12.4 (L) 13.0 - 17.0 g/dL   HCT 35.7 (L) 39.0 - 52.0 %   MCV 96.0 78.0 - 100.0 fL   MCH 33.3 26.0 - 34.0 pg   MCHC 34.7 30.0 - 36.0 g/dL   RDW 14.5 11.5 - 15.5 %   Platelets 192 150 - 400 K/uL   Neutrophils Relative % 46 43 - 77 %   Neutro Abs 1.9 1.7 - 7.7 K/uL   Lymphocytes Relative 32 12 - 46 %   Lymphs Abs 1.3 0.7 - 4.0 K/uL   Monocytes Relative 19 (H) 3 - 12 %   Monocytes Absolute 0.7 0.1 - 1.0 K/uL   Eosinophils Relative 1 0 - 5 %   Eosinophils Absolute 0.0 0.0 - 0.7 K/uL   Basophils Relative 2 (H) 0 - 1 %   Basophils Absolute 0.1 0.0 - 0.1 K/uL  Comprehensive metabolic panel     Status: Abnormal   Collection Time: 12/30/14  6:33 AM  Result Value Ref Range   Sodium 140 135 - 145 mmol/L   Potassium 4.3 3.5 - 5.1 mmol/L   Chloride 104 101 - 111 mmol/L   CO2 25 22 - 32 mmol/L   Glucose, Bld 72 65 - 99 mg/dL   BUN 24 (H) 6 - 20 mg/dL   Creatinine, Ser 1.59 (H) 0.61 - 1.24 mg/dL   Calcium 9.0 8.9 - 10.3 mg/dL   Total Protein 5.7 (L) 6.5 - 8.1 g/dL   Albumin 3.3 (L) 3.5 - 5.0 g/dL    AST 24 15 - 41 U/L   ALT 18 17 - 63 U/L   Alkaline Phosphatase 44 38 - 126 U/L   Total Bilirubin 1.0 0.3 - 1.2 mg/dL   GFR calc non Af Amer 45 (L) >60 mL/min   GFR calc Af Amer 52 (L) >60 mL/min    Comment: (NOTE) The eGFR has been calculated using the CKD EPI equation. This calculation has not been validated in all clinical situations. eGFR's persistently <60 mL/min signify possible Chronic Kidney Disease.    Anion gap 11 5 - 15  Protime-INR     Status: None   Collection Time: 12/30/14  6:33 AM  Result Value Ref Range   Prothrombin Time 13.8 11.6 - 15.2 seconds   INR 1.05 0.00 - 1.49  Troponin I (q 6hr x 3)     Status: None   Collection Time: 12/30/14  6:33 AM  Result Value Ref Range   Troponin I <0.03 <0.031 ng/mL    Comment:        NO  INDICATION OF MYOCARDIAL INJURY.   Glucose, capillary     Status: None   Collection Time: 12/30/14  7:38 AM  Result Value Ref Range   Glucose-Capillary 82 65 - 99 mg/dL  D-dimer, quantitative     Status: None   Collection Time: 12/30/14 10:38 AM  Result Value Ref Range   D-Dimer, Quant 0.35 0.00 - 0.48 ug/mL-FEU    Comment:        AT THE INHOUSE ESTABLISHED CUTOFF VALUE OF 0.48 ug/mL FEU, THIS ASSAY HAS BEEN DOCUMENTED IN THE LITERATURE TO HAVE A SENSITIVITY AND NEGATIVE PREDICTIVE VALUE OF AT LEAST 98 TO 99%.  THE TEST RESULT SHOULD BE CORRELATED WITH AN ASSESSMENT OF THE CLINICAL PROBABILITY OF DVT / VTE.   Glucose, capillary     Status: None   Collection Time: 12/30/14 11:38 AM  Result Value Ref Range   Glucose-Capillary 73 65 - 99 mg/dL   Comment 1 Notify RN   Troponin I (q 6hr x 3)     Status: None   Collection Time: 12/30/14 12:00 PM  Result Value Ref Range   Troponin I <0.03 <0.031 ng/mL    Comment:        NO INDICATION OF MYOCARDIAL INJURY.   Glucose, capillary     Status: Abnormal   Collection Time: 12/30/14  7:05 PM  Result Value Ref Range   Glucose-Capillary 113 (H) 65 - 99 mg/dL   Comment 1 Notify RN     Comment 2 Document in Chart   Troponin I (q 6hr x 3)     Status: None   Collection Time: 12/30/14  8:57 PM  Result Value Ref Range   Troponin I <0.03 <0.031 ng/mL    Comment:        NO INDICATION OF MYOCARDIAL INJURY.   Glucose, capillary     Status: None   Collection Time: 12/31/14 12:17 AM  Result Value Ref Range   Glucose-Capillary 93 65 - 99 mg/dL  CBC     Status: Abnormal   Collection Time: 12/31/14  3:39 AM  Result Value Ref Range   WBC 5.2 4.0 - 10.5 K/uL   RBC 3.84 (L) 4.22 - 5.81 MIL/uL   Hemoglobin 12.5 (L) 13.0 - 17.0 g/dL   HCT 37.0 (L) 39.0 - 52.0 %   MCV 96.4 78.0 - 100.0 fL   MCH 32.6 26.0 - 34.0 pg   MCHC 33.8 30.0 - 36.0 g/dL   RDW 14.4 11.5 - 15.5 %   Platelets 201 150 - 400 K/uL  Comprehensive metabolic panel     Status: Abnormal   Collection Time: 12/31/14  3:39 AM  Result Value Ref Range   Sodium 140 135 - 145 mmol/L   Potassium 4.3 3.5 - 5.1 mmol/L   Chloride 105 101 - 111 mmol/L   CO2 26 22 - 32 mmol/L   Glucose, Bld 94 65 - 99 mg/dL   BUN 25 (H) 6 - 20 mg/dL   Creatinine, Ser 1.82 (H) 0.61 - 1.24 mg/dL   Calcium 8.9 8.9 - 10.3 mg/dL   Total Protein 6.1 (L) 6.5 - 8.1 g/dL   Albumin 3.3 (L) 3.5 - 5.0 g/dL   AST 31 15 - 41 U/L   ALT 15 (L) 17 - 63 U/L   Alkaline Phosphatase 47 38 - 126 U/L   Total Bilirubin 1.7 (H) 0.3 - 1.2 mg/dL   GFR calc non Af Amer 38 (L) >60 mL/min   GFR calc Af Amer 45 (L) >60  mL/min    Comment: (NOTE) The eGFR has been calculated using the CKD EPI equation. This calculation has not been validated in all clinical situations. eGFR's persistently <60 mL/min signify possible Chronic Kidney Disease.    Anion gap 9 5 - 15  Glucose, capillary     Status: None   Collection Time: 12/31/14  6:33 AM  Result Value Ref Range   Glucose-Capillary 94 65 - 99 mg/dL   Comment 1 Notify RN      HPI : 64 year old with history of diabetes, Down syndrome, presents with chest pain.He's unable to give very much history because of his  Down syndrome. I did talk to his supervisor by phone who was with him. They said he really started having some arm shaking. This was his left arm. He then started to complain of some chest pain. He is able to tell me he's had some indigestion pain. He's not able to give me any further details about it. It's not clear whether he is having pain now. He looks comfortable. He's had no prior cardiac history that I can find. His enzyme has been negative x 1. EKG did not demonstrate acute changes. An echocardiogram has been ordered.   HOSPITAL COURSE:   CHEST PAIN: Unable to quantify or qualify his symptoms. Cardiac enzymes were found to be negative, He does have significant risk factors. stress testing was ordered, results pending at this time Cardiology consultation was obtained and they  recommended   [facility]. D-dimer negative 2-D echo was found to be reassuring with results as above  HTN: BP soft, Cozaar has been discontinued, may be restarted upon PCP appointment  DM: A1C is 5.9. Placed on sliding scale insulin, followed by Dr. Dwyane Dee. Resume Actos and metformin post discharge  Facial rash continue clindamycin gel   Discharge Exam:    Blood pressure 87/45, pulse 53, temperature 97.7 F (36.5 C), temperature source Axillary, resp. rate 18, height 5' (1.524 m), weight 68.13 kg (150 lb 3.2 oz), SpO2 94 %.  General: No acute respiratory distress, very slow mentation Lungs: Clear to auscultation bilaterally without wheezes or crackles Cardiovascular: Regular rate and rhythm without murmur gallop or rub normal S1 and S2 Abdomen: Nontender, nondistended, soft, bowel sounds positive, no rebound, no ascites, no appreciable mass Extremities: No significant cyanosis, clubbing, or edema bilateral lower extremities        Discharge Instructions    Diet - low sodium heart healthy    Complete by:  As directed      Increase activity slowly    Complete by:  As directed               Signed: Shekelia Boutin 12/31/2014, 11:04 AM

## 2014-12-31 NOTE — Progress Notes (Signed)
Lexiscan MV performed, 1 day study. 

## 2014-12-31 NOTE — Progress Notes (Addendum)
3:50pm  Patient will discharge to [facility] group home Anticipated discharge date:12/31/14 Transportation by [facility] group home transport- RN to call when Dr clears pt to go  CSW signing off.  9:30am CS[facility]- she will find the appropriate POA contact information and call Amber to provide that information.  CSW will continue to follow.  Merlyn LotJenna Holoman, LCSWA Clinical Social Worker [phone]

## 2015-01-01 ENCOUNTER — Encounter (HOSPITAL_COMMUNITY): Payer: Self-pay | Admitting: Emergency Medicine

## 2015-01-01 ENCOUNTER — Emergency Department (HOSPITAL_COMMUNITY)
Admission: EM | Admit: 2015-01-01 | Discharge: 2015-01-01 | Disposition: A | Discharge status: Home / Self Care | Payer: Medicare Other | Attending: Emergency Medicine | Admitting: Emergency Medicine

## 2015-01-01 ENCOUNTER — Emergency Department (HOSPITAL_COMMUNITY): Payer: Medicare Other

## 2015-01-01 DIAGNOSIS — N390 Urinary tract infection, site not specified: Secondary | ICD-10-CM | POA: Insufficient documentation

## 2015-01-01 DIAGNOSIS — S3991XA Unspecified injury of abdomen, initial encounter: Secondary | ICD-10-CM | POA: Diagnosis not present

## 2015-01-01 DIAGNOSIS — Z792 Long term (current) use of antibiotics: Secondary | ICD-10-CM | POA: Diagnosis not present

## 2015-01-01 DIAGNOSIS — E119 Type 2 diabetes mellitus without complications: Secondary | ICD-10-CM | POA: Diagnosis not present

## 2015-01-01 DIAGNOSIS — Y929 Unspecified place or not applicable: Secondary | ICD-10-CM | POA: Insufficient documentation

## 2015-01-01 DIAGNOSIS — S8002XA Contusion of left knee, initial encounter: Secondary | ICD-10-CM | POA: Insufficient documentation

## 2015-01-01 DIAGNOSIS — Y999 Unspecified external cause status: Secondary | ICD-10-CM | POA: Insufficient documentation

## 2015-01-01 DIAGNOSIS — Y9301 Activity, walking, marching and hiking: Secondary | ICD-10-CM | POA: Diagnosis not present

## 2015-01-01 DIAGNOSIS — Q909 Down syndrome, unspecified: Secondary | ICD-10-CM | POA: Insufficient documentation

## 2015-01-01 DIAGNOSIS — Z79899 Other long term (current) drug therapy: Secondary | ICD-10-CM | POA: Diagnosis not present

## 2015-01-01 DIAGNOSIS — R197 Diarrhea, unspecified: Secondary | ICD-10-CM

## 2015-01-01 DIAGNOSIS — S8992XA Unspecified injury of left lower leg, initial encounter: Secondary | ICD-10-CM | POA: Diagnosis present

## 2015-01-01 DIAGNOSIS — W1830XA Fall on same level, unspecified, initial encounter: Secondary | ICD-10-CM | POA: Diagnosis not present

## 2015-01-01 DIAGNOSIS — R109 Unspecified abdominal pain: Secondary | ICD-10-CM

## 2015-01-01 DIAGNOSIS — Z7982 Long term (current) use of aspirin: Secondary | ICD-10-CM | POA: Diagnosis not present

## 2015-01-01 DIAGNOSIS — T148XXA Other injury of unspecified body region, initial encounter: Secondary | ICD-10-CM

## 2015-01-01 LAB — URINALYSIS, ROUTINE W REFLEX MICROSCOPIC
Bilirubin Urine: NEGATIVE
GLUCOSE, UA: NEGATIVE mg/dL
Ketones, ur: 15 mg/dL — AB
NITRITE: NEGATIVE
Protein, ur: NEGATIVE mg/dL
Specific Gravity, Urine: 1.017 (ref 1.005–1.030)
Urobilinogen, UA: 0.2 mg/dL (ref 0.0–1.0)
pH: 6 (ref 5.0–8.0)

## 2015-01-01 LAB — COMPREHENSIVE METABOLIC PANEL
ALT: 18 U/L (ref 17–63)
ANION GAP: 13 (ref 5–15)
AST: 42 U/L — ABNORMAL HIGH (ref 15–41)
Albumin: 4 g/dL (ref 3.5–5.0)
Alkaline Phosphatase: 57 U/L (ref 38–126)
BILIRUBIN TOTAL: 2.2 mg/dL — AB (ref 0.3–1.2)
BUN: 24 mg/dL — ABNORMAL HIGH (ref 6–20)
CALCIUM: 9.5 mg/dL (ref 8.9–10.3)
CHLORIDE: 103 mmol/L (ref 101–111)
CO2: 21 mmol/L — ABNORMAL LOW (ref 22–32)
Creatinine, Ser: 1.42 mg/dL — ABNORMAL HIGH (ref 0.61–1.24)
GFR calc non Af Amer: 52 mL/min — ABNORMAL LOW (ref >60)
GLUCOSE: 101 mg/dL — AB (ref 65–99)
POTASSIUM: 5.4 mmol/L — AB (ref 3.5–5.1)
Sodium: 137 mmol/L (ref 135–145)
Total Protein: 6.7 g/dL (ref 6.5–8.1)

## 2015-01-01 LAB — CBC WITH DIFFERENTIAL/PLATELET
BASOS PCT: 1 % (ref 0–1)
Basophils Absolute: 0 10*3/uL (ref 0.0–0.1)
EOS ABS: 0 10*3/uL (ref 0.0–0.7)
EOS PCT: 0 % (ref 0–5)
HCT: 41.5 % (ref 39.0–52.0)
Hemoglobin: 14.1 g/dL (ref 13.0–17.0)
LYMPHS ABS: 0.8 10*3/uL (ref 0.7–4.0)
LYMPHS PCT: 11 % — AB (ref 12–46)
MCH: 32.6 pg (ref 26.0–34.0)
MCHC: 34 g/dL (ref 30.0–36.0)
MCV: 95.8 fL (ref 78.0–100.0)
MONO ABS: 0.8 10*3/uL (ref 0.1–1.0)
MONOS PCT: 11 % (ref 3–12)
NEUTROS PCT: 77 % (ref 43–77)
Neutro Abs: 5.4 10*3/uL (ref 1.7–7.7)
Platelets: 193 10*3/uL (ref 150–400)
RBC: 4.33 MIL/uL (ref 4.22–5.81)
RDW: 14.5 % (ref 11.5–15.5)
WBC: 7.1 10*3/uL (ref 4.0–10.5)

## 2015-01-01 LAB — URINE MICROSCOPIC-ADD ON

## 2015-01-01 MED ORDER — LOPERAMIDE HCL 2 MG PO CAPS: 2.0000 mg | ORAL_CAPSULE | Freq: Four times a day (QID) | ORAL | Status: DC | PRN

## 2015-01-01 MED ORDER — SODIUM CHLORIDE 0.9 % IV BOLUS (SEPSIS)
500.0000 mL | Freq: Once | INTRAVENOUS | Status: AC
  Administered 2015-01-01: 500 mL via INTRAVENOUS

## 2015-01-01 MED ORDER — CIPROFLOXACIN HCL 500 MG PO TABS: 500.0000 mg | ORAL_TABLET | Freq: Two times a day (BID) | ORAL | Status: DC

## 2015-01-01 MED ORDER — METRONIDAZOLE 500 MG PO TABS: 500.0000 mg | ORAL_TABLET | Freq: Three times a day (TID) | ORAL | Status: DC

## 2015-01-01 NOTE — Discharge Instructions (Signed)
Contusion A contusion is a deep bruise. Contusions are the result of an injury that caused bleeding under the skin. The contusion may turn blue, purple, or yellow. Minor injuries will give you a painless contusion, but more severe contusions may stay painful and swollen for a few weeks.  CAUSES  A contusion is usually caused by a blow, trauma, or direct force to an area of the body. SYMPTOMS   Swelling and redness of the injured area.  Bruising of the injured area.  Tenderness and soreness of the injured area.  Pain. DIAGNOSIS  The diagnosis can be made by taking a history and physical exam. An X-ray, CT scan, or MRI may be needed to determine if there were any associated injuries, such as fractures. TREATMENT  Specific treatment will depend on what area of the body was injured. In general, the best treatment for a contusion is resting, icing, elevating, and applying cold compresses to the injured area. Over-the-counter medicines may also be recommended for pain control. Ask your caregiver what the best treatment is for your contusion. HOME CARE INSTRUCTIONS   Put ice on the injured area.  Put ice in a plastic bag.  Place a towel between your skin and the bag.  Leave the ice on for 15-20 minutes, 3-4 times a day, or as directed by your health care provider.  Only take over-the-counter or prescription medicines for pain, discomfort, or fever as directed by your caregiver. Your caregiver may recommend avoiding anti-inflammatory medicines (aspirin, ibuprofen, and naproxen) for 48 hours because these medicines may increase bruising.  Rest the injured area.  If possible, elevate the injured area to reduce swelling. SEEK IMMEDIATE MEDICAL CARE IF:   You have increased bruising or swelling.  You have pain that is getting worse.  Your swelling or pain is not relieved with medicines. MAKE SURE YOU:   Understand these instructions.  Will watch your condition.  Will get help right  away if you are not doing well or get worse. Document Released: 05/16/2005 Document Revised: 08/11/2013 Document Reviewed: 06/11/2011 [facility] Patient Information 2015 [place]. This information is not intended to replace advice given to you by your health care provider. Make sure you discuss any questions you have with your health care provider.  Diarrhea Diarrhea is frequent loose and watery bowel movements. It can cause you to feel weak and dehydrated. Dehydration can cause you to become tired and thirsty, have a dry mouth, and have decreased urination that often is dark yellow. Diarrhea is a sign of another problem, most often an infection that will not last long. In most cases, diarrhea typically lasts 2-3 days. However, it can last longer if it is a sign of something more serious. It is important to treat your diarrhea as directed by your caregiver to lessen or prevent future episodes of diarrhea. CAUSES  Some common causes include:  Gastrointestinal infections caused by viruses, bacteria, or parasites.  Food poisoning or food allergies.  Certain medicines, such as antibiotics, chemotherapy, and laxatives.  Artificial sweeteners and fructose.  Digestive disorders. HOME CARE INSTRUCTIONS  Ensure adequate fluid intake (hydration): Have 1 cup (8 oz) of fluid for each diarrhea episode. Avoid fluids that contain simple sugars or sports drinks, fruit juices, whole milk products, and sodas. Your urine should be clear or pale yellow if you are drinking enough fluids. Hydrate with an oral rehydration solution that you can purchase at pharmacies, retail stores, and online. You can prepare an oral rehydration solution at home  by mixing the following ingredients together:   - tsp table salt.   tsp baking soda.   tsp salt substitute containing potassium chloride.  1  tablespoons sugar.  1 L (34 oz) of water.  Certain foods and beverages may increase the speed at which food moves  through the gastrointestinal (GI) tract. These foods and beverages should be avoided and include:  Caffeinated and alcoholic beverages.  High-fiber foods, such as raw fruits and vegetables, nuts, seeds, and whole grain breads and cereals.  Foods and beverages sweetened with sugar alcohols, such as xylitol, sorbitol, and mannitol.  Some foods may be well tolerated and may help thicken stool including:  Starchy foods, such as rice, toast, pasta, low-sugar cereal, oatmeal, grits, baked potatoes, crackers, and bagels.  Bananas.  Applesauce.  Add probiotic-rich foods to help increase healthy bacteria in the GI tract, such as yogurt and fermented milk products.  Wash your hands well after each diarrhea episode.  Only take over-the-counter or prescription medicines as directed by your caregiver.  Take a warm bath to relieve any burning or pain from frequent diarrhea episodes. SEEK IMMEDIATE MEDICAL CARE IF:   You are unable to keep fluids down.  You have persistent vomiting.  You have blood in your stool, or your stools are black and tarry.  You do not urinate in 6-8 hours, or there is only a small amount of very dark urine.  You have abdominal pain that increases or localizes.  You have weakness, dizziness, confusion, or light-headedness.  You have a severe headache.  Your diarrhea gets worse or does not get better.  You have a fever or persistent symptoms for more than 2-3 days.  You have a fever and your symptoms suddenly get worse. MAKE SURE YOU:   Understand these instructions.  Will watch your condition.  Will get help right away if you are not doing well or get worse. Document Released: 07/27/2002 Document Revised: 12/21/2013 Document Reviewed: 04/13/2012 [facility] LopezExit[facility] Patient Information 2015 [place], [place]. This information is not intended to replace advice given to you by your health care provider. Make sure you discuss any questions you have with your health care  provider.  Urinary Tract Infection Urinary tract infections (UTIs) can develop anywhere along your urinary tract. Your urinary tract is your body's drainage system for removing wastes and extra water. Your urinary tract includes two kidneys, two ureters, a bladder, and a urethra. Your kidneys are a pair of bean-shaped organs. Each kidney is about the size of your fist. They are located below your ribs, one on each side of your spine. CAUSES Infections are caused by microbes, which are microscopic organisms, including fungi, viruses, and bacteria. These organisms are so small that they can only be seen through a microscope. Bacteria are the microbes that most commonly cause UTIs. SYMPTOMS  Symptoms of UTIs may vary by age and gender of the patient and by the location of the infection. Symptoms in young women typically include a frequent and intense urge to urinate and a painful, burning feeling in the bladder or urethra during urination. Older women and men are more likely to be tired, shaky, and weak and have muscle aches and abdominal pain. A fever may mean the infection is in your kidneys. Other symptoms of a kidney infection include pain in your back or sides below the ribs, nausea, and vomiting. DIAGNOSIS To diagnose a UTI, your caregiver will ask you about your symptoms. Your caregiver also will ask to provide a urine  sample. The urine sample will be tested for bacteria and white blood cells. White blood cells are made by your body to help fight infection. TREATMENT  Typically, UTIs can be treated with medication. Because most UTIs are caused by a bacterial infection, they usually can be treated with the use of antibiotics. The choice of antibiotic and length of treatment depend on your symptoms and the type of bacteria causing your infection. HOME CARE INSTRUCTIONS  If you were prescribed antibiotics, take them exactly as your caregiver instructs you. Finish the medication even if you feel better  after you have only taken some of the medication.  Drink enough water and fluids to keep your urine clear or pale yellow.  Avoid caffeine, tea, and carbonated beverages. They tend to irritate your bladder.  Empty your bladder often. Avoid holding urine for long periods of time.  Empty your bladder before and after sexual intercourse.  After a bowel movement, women should cleanse from front to back. Use each tissue only once. SEEK MEDICAL CARE IF:   You have back pain.  You develop a fever.  Your symptoms do not begin to resolve within 3 days. SEEK IMMEDIATE MEDICAL CARE IF:   You have severe back pain or lower abdominal pain.  You develop chills.  You have nausea or vomiting.  You have continued burning or discomfort with urination. MAKE SURE YOU:   Understand these instructions.  Will watch your condition.  Will get help right away if you are not doing well or get worse. Document Released: 05/16/2005 Document Revised: 02/05/2012 Document Reviewed: 09/14/2011 [facility] Patient Information 2015 [place], [place]. This information is not intended to replace advice given to you by your health care provider. Make sure you discuss any questions you have with your health care provider.

## 2015-01-01 NOTE — ED Notes (Signed)
Pt attempted to use urinal again.  Pt was unable to void at this time.

## 2015-01-01 NOTE — ED Notes (Signed)
Pt from home via [facility] with c/o x3 witnessed falls by caregiver today from standing.  Pt c/o left knee pain, small abrasions noted to knee.  No head injury.  Pt has hx of osteoarthritis bilateral knees.  Caregiver reports pt has had multiple episodes of diarrhea since d/c from hospital 2 days ago.  Pt additionally reported stomach pain to EMS, EMS helped to the restroom where he did not have a BM.  Pt in NAD, ambulated with assistance from EMS stretcher to ED stretcher, alert.

## 2015-01-01 NOTE — ED Provider Notes (Addendum)
CSN: [id number]     Arrival date & time 01/01/15  1034 History   First MD Initiated Contact with Patient 01/01/15 1035     Chief Complaint  Patient presents with  . Knee Injury  . Fall     (Consider location/radiation/quality/duration/timing/severity/associated sxs/prior Treatment) HPI Comments: Patient presents to the ER for evaluation of knee injury from a fall. Patient reportedly has had at least 3 episodes of diarrhea today. He has been weaker than usual and fell while walking. Patient has a scrape on his left knee and is complaining of left knee pain. He has mentioned having some lower abdominal pain earlier as well. He denies this at time of my examination. Patient is a history of Down syndrome, cannot give much further information.Level V Caveat due to Down syndrome.  Patient is a 64 y.o. male presenting with fall.  Fall    Past Medical History  Diagnosis Date  . Down syndrome   . Diabetes mellitus without complication    History reviewed. No pertinent past surgical history. History reviewed. No pertinent family history. History  Substance Use Topics  . Smoking status: Never Smoker   . Smokeless tobacco: Never Used  . Alcohol Use: No    Review of Systems  Unable to perform ROS: Other      Allergies  Review of patient's allergies indicates no known allergies.  Home Medications   Prior to Admission medications   Medication Sig Start Date End Date Taking? Authorizing Provider  acetaminophen (TYLENOL) 500 MG tablet Take 500 mg by mouth 2 (two) times daily.     Historical Provider, MD  albuterol (PROVENTIL) (2.5 MG/3ML) 0.083% nebulizer solution Take 2.5 mg by nebulization 2 (two) times daily.     Historical Provider, MD  allopurinol (ZYLOPRIM) 100 MG tablet Take 100 mg by mouth daily.  04/20/13   Historical Provider, MD  aspirin 81 MG tablet Take 81 mg by mouth daily.    Historical Provider, MD  Calcium Carbonate-Vitamin D (CALCIUM + D PO) Take 1 tablet by mouth  daily.     Historical Provider, MD  ciprofloxacin (CIPRO) 500 MG tablet Take 1 tablet (500 mg total) by mouth 2 (two) times daily. 01/01/15   Orpah Greek, MD  clindamycin (CLINDAGEL) 1 % gel Apply 1 application topically as needed (for irritation).  11/17/14   Historical Provider, MD  [employer] MELATONIN 3 MG TABS Take 3 mg by mouth at bedtime.  09/08/14   Historical Provider, MD  donepezil (ARICEPT) 5 MG tablet Take 5 mg by mouth at bedtime.  10/19/14   Historical Provider, MD  fluticasone (FLONASE) 50 MCG/ACT nasal spray Place 2 sprays into both nostrils as needed for allergies.  09/28/14   Historical Provider, MD  ibuprofen (ADVIL,MOTRIN) 400 MG tablet Take 1 tablet (400 mg total) by mouth every 8 (eight) hours as needed. 07/12/14   Jennifer Piepenbrink, PA-C  lactulose (CHRONULAC) 10 GM/15ML solution Take 10 g by mouth daily as needed for moderate constipation.  02/26/13   Historical Provider, MD  loperamide (IMODIUM) 2 MG capsule Take 1 capsule (2 mg total) by mouth 4 (four) times daily as needed for diarrhea or loose stools. 01/01/15   Orpah Greek, MD  metroNIDAZOLE (FLAGYL) 500 MG tablet Take 1 tablet (500 mg total) by mouth 3 (three) times daily. 01/01/15   Orpah Greek, MD  mineral oil-hydrophilic petrolatum (AQUAPHOR) ointment Apply 1 application topically 2 (two) times daily.    Historical Provider, MD  oxybutynin (DITROPAN) 5  MG tablet Take 5 mg by mouth daily.  03/27/14   Historical Provider, MD  pioglitazone-metformin (ACTOPLUS MET) 15-850 MG per tablet Take 1 tablet by mouth 2 (two) times daily with a meal.  04/20/13   Historical Provider, MD  pravastatin (PRAVACHOL) 20 MG tablet Take 20 mg by mouth daily.  04/20/13   Historical Provider, MD  ranitidine (ZANTAC) 150 MG tablet Take 150 mg by mouth daily.  10/19/14   Historical Provider, MD  tamsulosin (FLOMAX) 0.4 MG CAPS capsule Take 0.4 mg by mouth daily.  04/20/13   Historical Provider, MD  VOLTAREN 1 % GEL Apply 2 g topically as  needed (for shoulder pain).  09/28/14   Historical Provider, MD   BP 128/64 mmHg  Pulse 85  Temp(Src) 97.6 F (36.4 C) (Oral)  SpO2 100% Physical Exam  Constitutional: He is oriented to person, place, and time. He appears well-developed and well-nourished. No distress.  HENT:  Head: Normocephalic and atraumatic.  Right Ear: Hearing normal.  Left Ear: Hearing normal.  Nose: Nose normal.  Mouth/Throat: Oropharynx is clear and moist and mucous membranes are normal.  Eyes: Conjunctivae and EOM are normal. Pupils are equal, round, and reactive to light.  Neck: Normal range of motion. Neck supple.  Cardiovascular: Regular rhythm, S1 normal and S2 normal.  Exam reveals no gallop and no friction rub.   No murmur heard. Pulmonary/Chest: Effort normal and breath sounds normal. No respiratory distress. He exhibits no tenderness.  Abdominal: Soft. Normal appearance and bowel sounds are normal. There is no hepatosplenomegaly. There is no tenderness. There is no rebound, no guarding, no tenderness at McBurney's point and negative Murphy's sign. No hernia.  Musculoskeletal: Normal range of motion.  Neurological: He is alert and oriented to person, place, and time. He has normal strength. No cranial nerve deficit or sensory deficit. Coordination normal. GCS eye subscore is 4. GCS verbal subscore is 5. GCS motor subscore is 6.  Skin: Skin is warm, dry and intact. No rash noted. No cyanosis.  Psychiatric: He has a normal mood and affect. His speech is normal and behavior is normal. Thought content normal.  Nursing note and vitals reviewed.   ED Course  Procedures (including critical care time) Labs Review Labs Reviewed  CBC WITH DIFFERENTIAL/PLATELET - Abnormal; Notable for the following:    Lymphocytes Relative 11 (*)    All other components within normal limits  COMPREHENSIVE METABOLIC PANEL - Abnormal; Notable for the following:    Potassium 5.4 (*)    CO2 21 (*)    Glucose, Bld 101 (*)    BUN  24 (*)    Creatinine, Ser 1.42 (*)    AST 42 (*)    Total Bilirubin 2.2 (*)    GFR calc non Af Amer 52 (*)    All other components within normal limits  URINALYSIS, ROUTINE W REFLEX MICROSCOPIC - Abnormal; Notable for the following:    Hgb urine dipstick SMALL (*)    Ketones, ur 15 (*)    Leukocytes, UA MODERATE (*)    All other components within normal limits  URINE MICROSCOPIC-ADD ON - Abnormal; Notable for the following:    Squamous Epithelial / LPF FEW (*)    Bacteria, UA FEW (*)    All other components within normal limits  CLOSTRIDIUM DIFFICILE BY PCR  URINE CULTURE    Imaging Review Nm Myocar Multi W/spect W/wall Motion / Ef  12/31/2014    This is a low risk study.  The left  ventricular ejection fraction is normal (55-65%).  Defect 1: There is a small defect of mild severity present in the basal  inferolateral location.   Question mild reversibility in the inferolateral base.  Septal dyskinesia  related to RBBB   Dg Knee Complete 4 Views Left  01/01/2015   CLINICAL DATA:  Status post fall with knee injury today.  EXAM: LEFT KNEE - COMPLETE 4+ VIEW  COMPARISON:  None.  FINDINGS: There is no evidence of fracture, dislocation, or joint effusion. There is chondrocalcinosis. There decreased knee joint space. Soft tissues are unremarkable.  IMPRESSION: No acute fracture or dislocation.   Electronically Signed   By: Abelardo Diesel M.D.   On: 01/01/2015 12:19   Dg Abd Acute W/chest  01/01/2015   CLINICAL DATA:  Generalized abdominal pain.  EXAM: DG ABDOMEN ACUTE W/ 1V CHEST  COMPARISON:  None.  FINDINGS: There is no evidence of dilated bowel loops or free intraperitoneal air. No radiopaque calculi or other significant radiographic abnormality is seen. Heart size and mediastinal contours are within normal limits. Status post cholecystectomy. Both lungs are clear.  IMPRESSION: No evidence of bowel obstruction or ileus. No acute cardiopulmonary disease.   Electronically Signed   By: Marijo Conception, M.D.   On: 01/01/2015 12:19     EKG Interpretation None      MDM   Final diagnoses:  Abdominal pain  Diarrhea  Contusion  UTI (lower urinary tract infection)    Patient presents to the ER for evaluation after a fall. Patient complaining of knee pain. X-ray was negative. There is a very superficial abrasion, otherwise no other evidence of injury. He has normal range of motion, no effusion. Patient has reportedly been experiencing diarrhea. He has not had any diarrhea here, no stool sample available for collection. Patient has been identified with urinary tract infection. Otherwise labs are unremarkable. Patient will be discharged, treatment for UTI, add Flagyl and Imodium.    Orpah Greek, MD 01/01/15 [place], MD 01/10/15 1501

## 2015-01-02 LAB — URINE CULTURE
CULTURE: NO GROWTH
Colony Count: NO GROWTH

## 2015-03-01 ENCOUNTER — Encounter: Payer: Self-pay | Admitting: Endocrinology

## 2015-03-07 ENCOUNTER — Other Ambulatory Visit: Payer: Medicare Other

## 2015-03-10 ENCOUNTER — Ambulatory Visit: Payer: Medicare Other | Admitting: Endocrinology

## 2015-03-18 ENCOUNTER — Other Ambulatory Visit (INDEPENDENT_AMBULATORY_CARE_PROVIDER_SITE_OTHER): Payer: Medicare Other

## 2015-03-18 DIAGNOSIS — E119 Type 2 diabetes mellitus without complications: Secondary | ICD-10-CM

## 2015-03-18 DIAGNOSIS — Q909 Down syndrome, unspecified: Secondary | ICD-10-CM | POA: Diagnosis not present

## 2015-03-18 LAB — COMPREHENSIVE METABOLIC PANEL
ALT: 16 U/L (ref 0–53)
AST: 20 U/L (ref 0–37)
Albumin: 3.5 g/dL (ref 3.5–5.2)
Alkaline Phosphatase: 53 U/L (ref 39–117)
BUN: 30 mg/dL — AB (ref 6–23)
CHLORIDE: 105 meq/L (ref 96–112)
CO2: 31 mEq/L (ref 19–32)
CREATININE: 1.43 mg/dL (ref 0.40–1.50)
Calcium: 9.6 mg/dL (ref 8.4–10.5)
GFR: 53.26 mL/min — ABNORMAL LOW (ref >60.00)
GLUCOSE: 81 mg/dL (ref 70–99)
Potassium: 4.4 mEq/L (ref 3.5–5.1)
SODIUM: 142 meq/L (ref 135–145)
TOTAL PROTEIN: 6.6 g/dL (ref 6.0–8.3)
Total Bilirubin: 0.6 mg/dL (ref 0.2–1.2)

## 2015-03-18 LAB — LIPID PANEL
CHOLESTEROL: 130 mg/dL (ref 0–200)
HDL: 57.4 mg/dL (ref >39.00)
LDL CALC: 61 mg/dL (ref 0–99)
NONHDL: 72.15
TRIGLYCERIDES: 54 mg/dL (ref 0.0–149.0)
Total CHOL/HDL Ratio: 2
VLDL: 10.8 mg/dL (ref 0.0–40.0)

## 2015-03-18 LAB — HEMOGLOBIN A1C: HEMOGLOBIN A1C: 5.6 % (ref 4.6–6.5)

## 2015-03-18 LAB — TSH: TSH: 2.77 u[IU]/mL (ref 0.35–4.50)

## 2015-03-23 ENCOUNTER — Ambulatory Visit (INDEPENDENT_AMBULATORY_CARE_PROVIDER_SITE_OTHER): Payer: Medicare Other | Admitting: Endocrinology

## 2015-03-23 ENCOUNTER — Encounter: Payer: Self-pay | Admitting: Endocrinology

## 2015-03-23 VITALS — BP 102/62 | HR 75 | Temp 97.8°F | Resp 14 | Ht 61.0 in | Wt 141.6 lb

## 2015-03-23 DIAGNOSIS — E119 Type 2 diabetes mellitus without complications: Secondary | ICD-10-CM | POA: Diagnosis not present

## 2015-03-23 NOTE — Progress Notes (Signed)
Patient ID: [name] [name], male   DOB: 08/23/1950, 64 y.o.   MRN: [id number]   Reason for Appointment: Diabetes follow-up   History of Present Illness   Diagnosis: Type 2 DIABETES MELITUS, date of diagnosis:  ? 1988     Previous history: He was initially treated with metformin with fairly good control but subsequently needed additional medications including Actos for control. However with weight loss over the last couple of years his Amaryl was tapered off  His A1c has usually been upper normal consistently Actos was started back in place of Januvia when he was losing weight progressively in the past  Recent history:  He has mild diabetes, generally easier to control in the last couple of years He has been on Actoplusmet once a day alone with good control.  His weight has gone down significantly since his last visit and not clear why A1c is again excellent but lower than expected  Home blood sugar readings are not available at the time of his visit  Difficult to get a history from him Lab glucose was 81  Oral hypoglycemic drugs: Actoplusmet once a day      Side effects from medications: None       Monitors blood glucose:  once  a day.    Glucometer:  unknown brand       Blood Glucose readings from [facility] review: Records pending     Meals: 3 meals per day.  Unable to get diet history         Physical activity: exercise: He is generally walking around his rest home, no programmed exercise   Wt Readings from Last 3 Encounters:  03/23/15 141 lb 9.6 oz (64.229 kg)  12/30/14 150 lb 3.2 oz (68.13 kg)  11/08/14 153 lb 6.4 oz (69.582 kg)    LABS:  Lab Results  Component Value Date   HGBA1C 5.6 03/18/2015   HGBA1C 5.9 11/05/2014   HGBA1C 5.9 07/20/2014   Lab Results  Component Value Date   MICROALBUR 2.5* 08/03/2014   LDLCALC 61 03/18/2015   CREATININE 1.43 03/18/2015     Lab on 03/18/2015  Component Date Value Ref Range Status  . Hgb A1c MFr Bld 03/18/2015 5.6  4.6  - 6.5 % Final   Glycemic Control Guidelines for People with Diabetes:Non Diabetic:  <6%Goal of Therapy: <7%Additional Action Suggested:  >8%   . Sodium 03/18/2015 142  135 - 145 mEq/L Final  . Potassium 03/18/2015 4.4  3.5 - 5.1 mEq/L Final  . Chloride 03/18/2015 105  96 - 112 mEq/L Final  . CO2 03/18/2015 31  19 - 32 mEq/L Final  . Glucose, Bld 03/18/2015 81  70 - 99 mg/dL Final  . BUN 03/18/2015 30* 6 - 23 mg/dL Final  . Creatinine, Ser 03/18/2015 1.43  0.40 - 1.50 mg/dL Final  . Total Bilirubin 03/18/2015 0.6  0.2 - 1.2 mg/dL Final  . Alkaline Phosphatase 03/18/2015 53  39 - 117 U/L Final  . AST 03/18/2015 20  0 - 37 U/L Final  . ALT 03/18/2015 16  0 - 53 U/L Final  . Total Protein 03/18/2015 6.6  6.0 - 8.3 g/dL Final  . Albumin 03/18/2015 3.5  3.5 - 5.2 g/dL Final  . Calcium 03/18/2015 9.6  8.4 - 10.5 mg/dL Final  . GFR 03/18/2015 53.26* >60.00 mL/min Final  . Cholesterol 03/18/2015 130  0 - 200 mg/dL Final   ATP III Classification       Desirable:  <  200 mg/dL               Borderline High:  200 - 239 mg/dL          High:  > = 240 mg/dL  . Triglycerides 03/18/2015 54.0  0.0 - 149.0 mg/dL Final   Normal:  <150 mg/dLBorderline High:  150 - 199 mg/dL  . HDL 03/18/2015 57.40  >39.00 mg/dL Final  . VLDL 03/18/2015 10.8  0.0 - 40.0 mg/dL Final  . LDL Cholesterol 03/18/2015 61  0 - 99 mg/dL Final  . Total CHOL/HDL Ratio 03/18/2015 2   Final                  Men          Women1/2 Average Risk     3.4          3.3Average Risk          5.0          4.42X Average Risk          9.6          7.13X Average Risk          15.0          11.0                      . NonHDL 03/18/2015 72.15   Final   NOTE:  Non-HDL goal should be 30 mg/dL higher than patient's LDL goal (i.e. LDL goal of < 70 mg/dL, would have non-HDL goal of < 100 mg/dL)  . TSH 03/18/2015 2.77  0.35 - 4.50 uIU/mL Final      Medication List       This list is accurate as of: 03/23/15 11:24 AM.  Always use your most recent med list.                 acetaminophen 500 MG tablet  Commonly known as:  TYLENOL  Take 500 mg by mouth 2 (two) times daily.     albuterol (2.5 MG/3ML) 0.083% nebulizer solution  Commonly known as:  PROVENTIL  Take 2.5 mg by nebulization 2 (two) times daily.     allopurinol 100 MG tablet  Commonly known as:  ZYLOPRIM  Take 100 mg by mouth daily.     aspirin 81 MG tablet  Take 81 mg by mouth daily.     CALCIUM + D PO  Take 1 tablet by mouth daily.     clindamycin 1 % gel  Commonly known as:  CLINDAGEL  Apply 1 application topically as needed (for irritation).     [facility] MELATONIN 3 MG Tabs  Generic drug:  Melatonin  Take 3 mg by mouth at bedtime.     donepezil 5 MG tablet  Commonly known as:  ARICEPT  Take 5 mg by mouth at bedtime.     fluticasone 50 MCG/ACT nasal spray  Commonly known as:  FLONASE  Place 2 sprays into both nostrils as needed for allergies.     ibuprofen 400 MG tablet  Commonly known as:  ADVIL,MOTRIN  Take 1 tablet (400 mg total) by mouth every 8 (eight) hours as needed.     lactulose 10 GM/15ML solution  Commonly known as:  CHRONULAC  Take 10 g by mouth daily as needed for moderate constipation.     loperamide 2 MG capsule  Commonly known as:  IMODIUM  Take 1 capsule (2 mg total) by mouth 4 (four) times daily  as needed for diarrhea or loose stools.     metroNIDAZOLE 500 MG tablet  Commonly known as:  FLAGYL  Take 1 tablet (500 mg total) by mouth 3 (three) times daily.     mineral oil-hydrophilic petrolatum ointment  Apply 1 application topically 2 (two) times daily.     nitrofurantoin (macrocrystal-monohydrate) 100 MG capsule  Commonly known as:  MACROBID     nitrofurantoin 100 MG capsule  Commonly known as:  MACRODANTIN  TAKE ONE CAPSULE BY MOUTH TWICE A DAY FOR 7 DAYS     oxybutynin 5 MG tablet  Commonly known as:  DITROPAN  Take 5 mg by mouth daily.     pioglitazone-metformin 15-850 MG per tablet  Commonly known as:  ACTOPLUS MET  Take 1  tablet by mouth 2 (two) times daily with a meal.     pravastatin 20 MG tablet  Commonly known as:  PRAVACHOL  Take 20 mg by mouth daily.     ranitidine 150 MG tablet  Commonly known as:  ZANTAC  Take 150 mg by mouth daily.     tamsulosin 0.4 MG Caps capsule  Commonly known as:  FLOMAX  Take 0.4 mg by mouth daily.     VITAMIN B-12 IJ  Inject 1 mL as directed.     VOLTAREN 1 % Gel  Generic drug:  diclofenac sodium  Apply 2 g topically as needed (for shoulder pain).        Allergies: No Known Allergies  Past Medical History  Diagnosis Date  . Down syndrome   . Diabetes mellitus without complication     No past surgical history on file.  No family history on file.  Social History:  reports that he has never smoked. He has never used smokeless tobacco. He reports that he does not drink alcohol. His drug history is not on file.  Review of Systems:  Hypertension:  not present, was taking low-dose losartan prescribed by PCP but apparently stopped when he was hospitalized in May  No history of microalbuminuria  His creatinine has been high normal, not clear of etiology  Lab Results  Component Value Date   CREATININE 1.43 03/18/2015   CREATININE 1.42* 01/01/2015   CREATININE 1.82* 12/31/2014     Lipids: Lipids are controlled, taking pravastatin 20 mg  Lab Results  Component Value Date   CHOL 130 03/18/2015   HDL 57.40 03/18/2015   LDLCALC 61 03/18/2015   TRIG 54.0 03/18/2015   CHOLHDL 2 03/18/2015   Diabetic foot exam done in 03/2015.  He is unreliable with his responses for sensory testing     Examination:   BP 102/62 mmHg  Pulse 75  Temp(Src) 97.8 F (36.6 C)  Resp 14  Ht 5' 1"  (1.549 m)  Wt 141 lb 9.6 oz (64.229 kg)  BMI 26.77 kg/m2  SpO2 90%  Body mass index is 26.77 kg/(m^2).   No peripheral edema  Diabetic foot exam shows normal monofilament sensation in the toes and plantar surfaces, no skin lesions or ulcers on the feet and normal pedal  pulses   ASSESSMENT/ PLAN:   Diabetes type 2   Blood glucose control is excellent with A1c again upper normal Has been consistently controlled with low-dose Actos and metformin only His home glucose records are not available today Also he has lost weight for unclear reasons For now we'll continue him on same regimen He will continue the same regimen and followup in 6 months  RENAL insufficiency: His creatinine is persistently  high but stable No history of hypertension  History of mild hyperlipidemia and this is treated by PCP, LDL is recently 13  Lab reports sent to the rest home with the patient   [facility] 03/23/2015, 11:24 AM

## 2015-09-22 ENCOUNTER — Ambulatory Visit: Payer: Medicare Other | Admitting: Endocrinology

## 2015-09-26 ENCOUNTER — Ambulatory Visit: Payer: Medicare Other | Admitting: Endocrinology

## 2015-09-30 ENCOUNTER — Ambulatory Visit: Payer: Medicare Other | Admitting: Endocrinology

## 2015-10-06 ENCOUNTER — Ambulatory Visit (INDEPENDENT_AMBULATORY_CARE_PROVIDER_SITE_OTHER): Payer: Medicare Other | Admitting: Endocrinology

## 2015-10-06 ENCOUNTER — Encounter: Payer: Self-pay | Admitting: Endocrinology

## 2015-10-06 VITALS — BP 100/64 | HR 86 | Temp 97.7°F | Resp 16 | Ht 61.0 in | Wt 130.6 lb

## 2015-10-06 DIAGNOSIS — E119 Type 2 diabetes mellitus without complications: Secondary | ICD-10-CM | POA: Diagnosis not present

## 2015-10-06 DIAGNOSIS — R634 Abnormal weight loss: Secondary | ICD-10-CM

## 2015-10-06 NOTE — Progress Notes (Signed)
Patient ID: [name] [name], male   DOB: 1950-11-25, 65 y.o.   MRN: [id number]   Reason for Appointment: Diabetes follow-up   History of Present Illness   Diagnosis: Type 2 DIABETES MELITUS, date of diagnosis:  ? 1988     Previous history: He was initially treated with metformin with fairly good control but subsequently needed additional medications including Actos for control. However with weight loss over the last couple of years his Amaryl was tapered off  His A1c has usually been upper normal consistently Actos was started back in place of Januvia when he was losing weight progressively in the past  Recent history:  He has mild diabetes, generally easier to control in the last couple of years He has been on Actoplusmet once a day in the evening with good control.  His weight has gone down significantly again for no reason A1c is not available today Home blood sugar readings are being checked and a few readings are available from the nursing home records for earlier this month Glucose range 96-127, alternating fasting and after supper  Difficult to get a history from him  Oral hypoglycemic drugs: Actoplusmet once a day      Side effects from medications: None       Monitors blood glucose:  once  a day.    Glucometer:  unknown brand       Blood Glucose readings from home record as above     Meals: 3 meals per day.  Unable to get diet history         Physical activity: exercise: Minimal   Wt Readings from Last 3 Encounters:  10/06/15 136 lb (61.689 kg)  03/23/15 141 lb 9.6 oz (64.229 kg)  12/30/14 150 lb 3.2 oz (68.13 kg)    LABS:  Lab Results  Component Value Date   HGBA1C 5.6 03/18/2015   HGBA1C 5.9 11/05/2014   HGBA1C 5.9 07/20/2014   Lab Results  Component Value Date   MICROALBUR 2.5* 08/03/2014   LDLCALC 61 03/18/2015   CREATININE 1.43 03/18/2015     No visits with results within 1 Week(s) from this visit. Latest known visit with results is:  Lab on  03/18/2015  Component Date Value Ref Range Status  . Hgb A1c MFr Bld 03/18/2015 5.6  4.6 - 6.5 % Final   Glycemic Control Guidelines for People with Diabetes:Non Diabetic:  <6%Goal of Therapy: <7%Additional Action Suggested:  >8%   . Sodium 03/18/2015 142  135 - 145 mEq/L Final  . Potassium 03/18/2015 4.4  3.5 - 5.1 mEq/L Final  . Chloride 03/18/2015 105  96 - 112 mEq/L Final  . CO2 03/18/2015 31  19 - 32 mEq/L Final  . Glucose, Bld 03/18/2015 81  70 - 99 mg/dL Final  . BUN 03/18/2015 30* 6 - 23 mg/dL Final  . Creatinine, Ser 03/18/2015 1.43  0.40 - 1.50 mg/dL Final  . Total Bilirubin 03/18/2015 0.6  0.2 - 1.2 mg/dL Final  . Alkaline Phosphatase 03/18/2015 53  39 - 117 U/L Final  . AST 03/18/2015 20  0 - 37 U/L Final  . ALT 03/18/2015 16  0 - 53 U/L Final  . Total Protein 03/18/2015 6.6  6.0 - 8.3 g/dL Final  . Albumin 03/18/2015 3.5  3.5 - 5.2 g/dL Final  . Calcium 03/18/2015 9.6  8.4 - 10.5 mg/dL Final  . GFR 03/18/2015 53.26* >60.00 mL/min Final  . Cholesterol 03/18/2015 130  0 - 200 mg/dL Final  ATP III Classification       Desirable:  < 200 mg/dL               Borderline High:  200 - 239 mg/dL          High:  > = 240 mg/dL  . Triglycerides 03/18/2015 54.0  0.0 - 149.0 mg/dL Final   Normal:  <150 mg/dLBorderline High:  150 - 199 mg/dL  . HDL 03/18/2015 57.40  >39.00 mg/dL Final  . VLDL 03/18/2015 10.8  0.0 - 40.0 mg/dL Final  . LDL Cholesterol 03/18/2015 61  0 - 99 mg/dL Final  . Total CHOL/HDL Ratio 03/18/2015 2   Final                  Men          Women1/2 Average Risk     3.4          3.3Average Risk          5.0          4.42X Average Risk          9.6          7.13X Average Risk          15.0          11.0                      . NonHDL 03/18/2015 72.15   Final   NOTE:  Non-HDL goal should be 30 mg/dL higher than patient's LDL goal (i.e. LDL goal of < 70 mg/dL, would have non-HDL goal of < 100 mg/dL)  . TSH 03/18/2015 2.77  0.35 - 4.50 uIU/mL Final      Medication List         This list is accurate as of: 10/06/15 11:59 PM.  Always use your most recent med list.               acetaminophen 500 MG tablet  Commonly known as:  TYLENOL  Take 500 mg by mouth 2 (two) times daily.     albuterol (2.5 MG/3ML) 0.083% nebulizer solution  Commonly known as:  PROVENTIL  Take 2.5 mg by nebulization 2 (two) times daily.     allopurinol 100 MG tablet  Commonly known as:  ZYLOPRIM  Take 100 mg by mouth daily.     aspirin 81 MG tablet  Take 81 mg by mouth daily.     CALCIUM + D PO  Take 1 tablet by mouth daily.     clindamycin 1 % gel  Commonly known as:  CLINDAGEL  Apply 1 application topically as needed (for irritation).     CVS MELATONIN 3 MG Tabs  Generic drug:  Melatonin  Take 3 mg by mouth at bedtime.     donepezil 5 MG tablet  Commonly known as:  ARICEPT  Take 5 mg by mouth at bedtime.     fluticasone 50 MCG/ACT nasal spray  Commonly known as:  FLONASE  Place 2 sprays into both nostrils as needed for allergies.     ibuprofen 400 MG tablet  Commonly known as:  ADVIL,MOTRIN  Take 1 tablet (400 mg total) by mouth every 8 (eight) hours as needed.     lactulose 10 GM/15ML solution  Commonly known as:  CHRONULAC  Take 10 g by mouth daily as needed for moderate constipation.     loperamide 2 MG capsule  Commonly known as:  IMODIUM  Take 1 capsule (2 mg total) by mouth 4 (four) times daily as needed for diarrhea or loose stools.     metroNIDAZOLE 500 MG tablet  Commonly known as:  FLAGYL  Take 1 tablet (500 mg total) by mouth 3 (three) times daily.     mineral oil-hydrophilic petrolatum ointment  Apply 1 application topically 2 (two) times daily.     nitrofurantoin (macrocrystal-monohydrate) 100 MG capsule  Commonly known as:  MACROBID     nitrofurantoin 100 MG capsule  Commonly known as:  MACRODANTIN  TAKE ONE CAPSULE BY MOUTH TWICE A DAY FOR 7 DAYS     oxybutynin 5 MG tablet  Commonly known as:  DITROPAN  Take 5 mg by mouth daily.      pioglitazone-metformin 15-850 MG tablet  Commonly known as:  ACTOPLUS MET  Take 1 tablet by mouth 2 (two) times daily with a meal.     pravastatin 20 MG tablet  Commonly known as:  PRAVACHOL  Take 20 mg by mouth daily.     ranitidine 150 MG tablet  Commonly known as:  ZANTAC  Take 150 mg by mouth daily.     tamsulosin 0.4 MG Caps capsule  Commonly known as:  FLOMAX  Take 0.4 mg by mouth daily.     VITAMIN B-12 IJ  Inject 1 mL as directed.     VOLTAREN 1 % Gel  Generic drug:  diclofenac sodium  Apply 2 g topically as needed (for shoulder pain).        Allergies: No Known Allergies  Past Medical History  Diagnosis Date  . Down syndrome   . Diabetes mellitus without complication ([place])     No past surgical history on file.  No family history on file.  Social History:  reports that he has never smoked. He has never used smokeless tobacco. He reports that he does not drink alcohol. His drug history is not on file.  Review of Systems:   No history of microalbuminuria  His creatinine has been high normal, no recent labs available Not on any antihypertensives   Lab Results  Component Value Date   CREATININE 1.43 03/18/2015   CREATININE 1.42* 01/01/2015   CREATININE 1.82* 12/31/2014     Lipids: Lipids are controlled, taking pravastatin 20 mg  Lab Results  Component Value Date   CHOL 130 03/18/2015   HDL 57.40 03/18/2015   LDLCALC 61 03/18/2015   TRIG 54.0 03/18/2015   CHOLHDL 2 03/18/2015   Diabetic foot exam done in 03/2015.  He is unreliable with his responses for sensory testing    He reportedly has had his influenza vaccine at the nursing home   Examination:   BP 100/64 mmHg  Pulse 86  Temp(Src) 97.7 F (36.5 C)  Resp 16  Ht 5' 1"  (1.549 m)  Wt 136 lb (61.689 kg)  BMI 25.71 kg/m2  SpO2 95%  Body mass index is 25.71 kg/(m^2).   No peripheral edema  Thyroid not palpable    ASSESSMENT/ PLAN:   Diabetes type 2  See history of present  illness for detailed discussion of his current management, blood sugar patterns and problems identified His blood sugars looked fairly good at home He has lost more weight but etiology unclear, followed by nursing home physician He is only on low-dose Actoplusmet and will continue A1c to be checked today  RENAL insufficiency: This will be followed up again today No history of hypertension  History of mild hyperlipidemia and this is treated by  PCP  Will check thyroid functions to evaluate his weight loss   Billi Bright 10/07/2015, 10:07 AM

## 2015-10-07 ENCOUNTER — Encounter: Payer: Self-pay | Admitting: Endocrinology

## 2015-10-07 LAB — BASIC METABOLIC PANEL
BUN: 26 mg/dL — ABNORMAL HIGH (ref 6–23)
CALCIUM: 9.6 mg/dL (ref 8.4–10.5)
CO2: 30 mEq/L (ref 19–32)
Chloride: 106 mEq/L (ref 96–112)
Creatinine, Ser: 1.36 mg/dL (ref 0.40–1.50)
GFR: 56.33 mL/min — ABNORMAL LOW (ref >60.00)
Glucose, Bld: 117 mg/dL — ABNORMAL HIGH (ref 70–99)
Potassium: 4.1 mEq/L (ref 3.5–5.1)
Sodium: 143 mEq/L (ref 135–145)

## 2015-10-07 LAB — TSH: TSH: 2.89 u[IU]/mL (ref 0.35–4.50)

## 2015-10-07 LAB — HEMOGLOBIN A1C: Hgb A1c MFr Bld: 5.7 % (ref 4.6–6.5)

## 2015-10-31 ENCOUNTER — Encounter (HOSPITAL_COMMUNITY): Payer: Self-pay | Admitting: Family Medicine

## 2015-10-31 ENCOUNTER — Emergency Department (HOSPITAL_COMMUNITY)
Admission: EM | Admit: 2015-10-31 | Discharge: 2015-10-31 | Disposition: A | Discharge status: Home / Self Care | Payer: Medicare Other | Attending: Emergency Medicine | Admitting: Emergency Medicine

## 2015-10-31 ENCOUNTER — Emergency Department (HOSPITAL_COMMUNITY): Payer: Medicare Other

## 2015-10-31 DIAGNOSIS — S0990XA Unspecified injury of head, initial encounter: Secondary | ICD-10-CM | POA: Diagnosis present

## 2015-10-31 DIAGNOSIS — S0181XA Laceration without foreign body of other part of head, initial encounter: Secondary | ICD-10-CM | POA: Insufficient documentation

## 2015-10-31 DIAGNOSIS — Y9389 Activity, other specified: Secondary | ICD-10-CM | POA: Diagnosis not present

## 2015-10-31 DIAGNOSIS — Z792 Long term (current) use of antibiotics: Secondary | ICD-10-CM | POA: Insufficient documentation

## 2015-10-31 DIAGNOSIS — Z79899 Other long term (current) drug therapy: Secondary | ICD-10-CM | POA: Diagnosis not present

## 2015-10-31 DIAGNOSIS — Y998 Other external cause status: Secondary | ICD-10-CM | POA: Insufficient documentation

## 2015-10-31 DIAGNOSIS — Y9289 Other specified places as the place of occurrence of the external cause: Secondary | ICD-10-CM | POA: Diagnosis not present

## 2015-10-31 DIAGNOSIS — E119 Type 2 diabetes mellitus without complications: Secondary | ICD-10-CM | POA: Insufficient documentation

## 2015-10-31 DIAGNOSIS — W01198A Fall on same level from slipping, tripping and stumbling with subsequent striking against other object, initial encounter: Secondary | ICD-10-CM | POA: Insufficient documentation

## 2015-10-31 DIAGNOSIS — Z7982 Long term (current) use of aspirin: Secondary | ICD-10-CM | POA: Insufficient documentation

## 2015-10-31 NOTE — ED Provider Notes (Signed)
CSN: [id number]     Arrival date & time 10/31/15  1047 History   First MD Initiated Contact with Patient 10/31/15 1412     Chief Complaint  Patient presents with  . Fall     (Consider location/radiation/quality/duration/timing/severity/associated sxs/prior Treatment) The history is provided by a caregiver.  [name] [name] is a 65 y.o. male hx of DM, down syndrome, previous C2 fracture managed conservatively here with fall. He is wheelchair bound. He is on his wheelchair and accidentally fell and hit his head on the floor. Was noted to have a laceration above right eyebrow. Denies LOC or syncope. Tdap no to date. He is still moving his neck and has no neck pain but given hx of C2 fracture, facility sent him for evaluation. Per the aide, his mental status is baseline.   Level V caveat- down syndrome    Past Medical History  Diagnosis Date  . Down syndrome   . Diabetes mellitus without complication ([place])    History reviewed. No pertinent past surgical history. History reviewed. No pertinent family history. Social History  Substance Use Topics  . Smoking status: Never Smoker   . Smokeless tobacco: Never Used  . Alcohol Use: No    Review of Systems  Skin: Positive for wound.  All other systems reviewed and are negative.     Allergies  Review of patient's allergies indicates no known allergies.  Home Medications   Prior to Admission medications   Medication Sig Start Date End Date Taking? Authorizing Provider  acetaminophen (TYLENOL) 500 MG tablet Take 500 mg by mouth 2 (two) times daily.     Historical Provider, MD  albuterol (PROVENTIL) (2.5 MG/3ML) 0.083% nebulizer solution Take 2.5 mg by nebulization 2 (two) times daily.     Historical Provider, MD  allopurinol (ZYLOPRIM) 100 MG tablet Take 100 mg by mouth daily.  04/20/13   Historical Provider, MD  aspirin 81 MG tablet Take 81 mg by mouth daily.    Historical Provider, MD  Calcium Carbonate-Vitamin D (CALCIUM + D PO) Take 1  tablet by mouth daily.     Historical Provider, MD  clindamycin (CLINDAGEL) 1 % gel Apply 1 application topically as needed (for irritation).  11/17/14   Historical Provider, MD  CVS MELATONIN 3 MG TABS Take 3 mg by mouth at bedtime.  09/08/14   Historical Provider, MD  Cyanocobalamin (VITAMIN B-12 IJ) Inject 1 mL as directed.    Historical Provider, MD  donepezil (ARICEPT) 5 MG tablet Take 5 mg by mouth at bedtime.  10/19/14   Historical Provider, MD  fluticasone (FLONASE) 50 MCG/ACT nasal spray Place 2 sprays into both nostrils as needed for allergies.  09/28/14   Historical Provider, MD  ibuprofen (ADVIL,MOTRIN) 400 MG tablet Take 1 tablet (400 mg total) by mouth every 8 (eight) hours as needed. 07/12/14   Jennifer Piepenbrink, PA-C  lactulose (CHRONULAC) 10 GM/15ML solution Take 10 g by mouth daily as needed for moderate constipation.  02/26/13   Historical Provider, MD  loperamide (IMODIUM) 2 MG capsule Take 1 capsule (2 mg total) by mouth 4 (four) times daily as needed for diarrhea or loose stools. 01/01/15   Orpah Greek, MD  metroNIDAZOLE (FLAGYL) 500 MG tablet Take 1 tablet (500 mg total) by mouth 3 (three) times daily. Patient not taking: Reported on 10/06/2015 01/01/15   Orpah Greek, MD  mineral oil-hydrophilic petrolatum (AQUAPHOR) ointment Apply 1 application topically 2 (two) times daily.    Historical Provider, MD  nitrofurantoin (MACRODANTIN) 100 MG capsule TAKE ONE CAPSULE BY MOUTH TWICE A DAY FOR 7 DAYS 12/31/14   Historical Provider, MD  nitrofurantoin, macrocrystal-monohydrate, (MACROBID) 100 MG capsule  12/16/14   Historical Provider, MD  oxybutynin (DITROPAN) 5 MG tablet Take 5 mg by mouth daily.  03/27/14   Historical Provider, MD  pioglitazone-metformin (ACTOPLUS MET) 15-850 MG per tablet Take 1 tablet by mouth 2 (two) times daily with a meal.  04/20/13   Historical Provider, MD  pravastatin (PRAVACHOL) 20 MG tablet Take 20 mg by mouth daily.  04/20/13   Historical Provider,  MD  ranitidine (ZANTAC) 150 MG tablet Take 150 mg by mouth daily.  10/19/14   Historical Provider, MD  tamsulosin (FLOMAX) 0.4 MG CAPS capsule Take 0.4 mg by mouth daily.  04/20/13   Historical Provider, MD  VOLTAREN 1 % GEL Apply 2 g topically as needed (for shoulder pain).  09/28/14   Historical Provider, MD   BP 136/118 mmHg  Pulse 59  Temp(Src) 97.7 F (36.5 C) (Oral)  Resp 18  Ht _0  (1.549 m)  Wt 130 lb (58.968 kg)  BMI 24.58 kg/m2  SpO2 94% Physical Exam  Constitutional:  Down syndrome, nonverbal, NAD   HENT:  Head: Normocephalic.  Mouth/Throat: Oropharynx is clear and moist.  Eyes: EOM are normal. Pupils are equal, round, and reactive to light.  1 cm linear laceration above L eyebrow, well approximated   Neck: Normal range of motion. Neck supple.  No obvious midline tenderness, no stepoff. Nl ROM   Cardiovascular: Normal rate, regular rhythm and normal heart sounds.   Pulmonary/Chest: Effort normal and breath sounds normal.  Abdominal: Soft. Bowel sounds are normal. He exhibits no distension. There is no tenderness. There is no rebound.  Musculoskeletal: Normal range of motion. He exhibits no edema or tenderness.  Neurological: He is alert.  Nonverbal at baseline. Mental status baseline per aide. Nl strength throughout, moving all extremities   Skin: Skin is warm.  Psychiatric:  Unable   Nursing note and vitals reviewed.   ED Course  Procedures (including critical care time)  LACERATION REPAIR Performed by: Shirlyn Goltz Authorized by: Shirlyn Goltz Consent: Verbal consent obtained. Risks and benefits: risks, benefits and alternatives were discussed Consent given by: patient Patient identity confirmed: provided demographic data Prepped and Draped in normal sterile fashion Wound explored  Laceration Location: L forehead above eyebrow  Laceration Length: 1 cm  No Foreign Bodies seen or palpated  Anesthesia: none  Local anesthetic: none  Irrigation method:  syringe Amount of cleaning: standard  Skin closure: dermabond   Patient tolerance: Patient tolerated the procedure well with no immediate complications.   Labs Review Labs Reviewed - No data to display  Imaging Review Dg Cervical Spine Complete  10/31/2015  CLINICAL DATA:  [place] today, history of cervical fractures, mentally handicapped, diabetes mellitus, Down syndrome EXAM: CERVICAL SPINE - COMPLETE 4+ VIEW COMPARISON:  None FINDINGS: Prevertebral soft tissues normal thickness. Bones demineralized. Multilevel disc space narrowing and endplate spur formation. Multilevel facet degenerative changes. Vertebral body heights maintained. Inferior cervical foramina incompletely profiled. Predental space normal thickness. No definite acute fracture, subluxation, or bone destruction. C1-C2 alignment grossly normal. Visualized lung apices clear. 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 No definite acute cervical spine abnormalities identified. Electronically Signed   By: Lavonia Dana M.D.   On: 10/31/2015 14:41   I have personally reviewed and evaluated these images and lab results as part of my  medical decision-making.   EKG Interpretation None      MDM   Final diagnoses:  None   OBINNA EHRESMAN is a 65 y.o. male here with fall from wheelchair. Has laceration above L eyebrow that is well approximated. I dermabond the laceration. Mental status at baseline and patient not on blood thinners. Nl neuro exam and has no obvious cervical tenderness. CT head/neck ordered in triage but patient unable to tolerate the scans. Xrays showed no acute fracture and I canceled CT head/neck. Stable for discharge     Wandra Arthurs, MD 10/31/15 1453

## 2015-10-31 NOTE — ED Notes (Signed)
Pt here for fall out of wheelchair this am striking head on the floor. Cut above left eye. Denise LOC.

## 2015-10-31 NOTE — ED Notes (Signed)
Verbal order by dr Silverio LayYao d/c head CT.

## 2015-10-31 NOTE — Discharge Instructions (Signed)
Take your meds as prescribed.   See your doctor.  Return to ER if you have severe headaches, neck pain, vomiting.

## 2016-04-04 ENCOUNTER — Encounter: Payer: Self-pay | Admitting: Endocrinology

## 2016-04-04 ENCOUNTER — Ambulatory Visit (INDEPENDENT_AMBULATORY_CARE_PROVIDER_SITE_OTHER): Payer: Medicare Other | Admitting: Endocrinology

## 2016-04-04 VITALS — BP 154/129 | HR 90 | Ht 61.0 in

## 2016-04-04 DIAGNOSIS — R634 Abnormal weight loss: Secondary | ICD-10-CM | POA: Diagnosis not present

## 2016-04-04 DIAGNOSIS — E119 Type 2 diabetes mellitus without complications: Secondary | ICD-10-CM | POA: Diagnosis not present

## 2016-04-04 LAB — COMPREHENSIVE METABOLIC PANEL
ALT: 25 U/L (ref 0–53)
AST: 26 U/L (ref 0–37)
Albumin: 3.8 g/dL (ref 3.5–5.2)
Alkaline Phosphatase: 50 U/L (ref 39–117)
BUN: 24 mg/dL — ABNORMAL HIGH (ref 6–23)
CO2: 32 meq/L (ref 19–32)
Calcium: 9.9 mg/dL (ref 8.4–10.5)
Chloride: 103 mEq/L (ref 96–112)
Creatinine, Ser: 1.35 mg/dL (ref 0.40–1.50)
GFR: 56.72 mL/min — AB (ref >60.00)
GLUCOSE: 88 mg/dL (ref 70–99)
POTASSIUM: 4.4 meq/L (ref 3.5–5.1)
Sodium: 140 mEq/L (ref 135–145)
Total Bilirubin: 0.5 mg/dL (ref 0.2–1.2)
Total Protein: 6.6 g/dL (ref 6.0–8.3)

## 2016-04-04 LAB — LIPID PANEL
CHOL/HDL RATIO: 2
Cholesterol: 143 mg/dL (ref 0–200)
HDL: 59.9 mg/dL (ref >39.00)
LDL CALC: 70 mg/dL (ref 0–99)
NONHDL: 82.86
Triglycerides: 64 mg/dL (ref 0.0–149.0)
VLDL: 12.8 mg/dL (ref 0.0–40.0)

## 2016-04-04 LAB — TSH: TSH: 2.64 u[IU]/mL (ref 0.35–4.50)

## 2016-04-04 LAB — HEMOGLOBIN A1C: HEMOGLOBIN A1C: 5.7 % (ref 4.6–6.5)

## 2016-04-04 NOTE — Progress Notes (Signed)
Patient ID: [name] [name], male   DOB: [birth year], 65 y.o.   MRN: [id number]   Reason for Appointment: Diabetes follow-up   History of Present Illness   Diagnosis: Type 2 DIABETES MELITUS, date of diagnosis:  ? 1988     Previous history: He was initially treated with metformin with fairly good control but subsequently needed additional medications including Actos for control. However with weight loss over the last couple of years his Amaryl was tapered off  His A1c has usually been upper normal consistently Actos was started back in place of Januvia when he was losing weight progressively in the past  Recent history:  He has mild diabetes, generally easier to control in the last couple of years He has been on Actoplusmet once a day in the evening with good control.  His weight has gone down  again and not clear why A1c is not available today  Home blood sugar readings are being checked either morning or after supper   Oral hypoglycemic drugs: Actoplusmet once a day      Side effects from medications: None       Monitors blood glucose:  once  a day.    Glucometer:  unknown brand       Blood Glucose readings from home record  RANGE 85-132 with mostly normal readings     Meals: 3 meals per day.  Unable to get diet history         Physical activity: exercise: Minimal  Recent weight reportedly 123.8  Wt Readings from Last 3 Encounters:  10/31/15 130 lb (59 kg)  10/06/15 130 lb 9.6 oz (59.2 kg)  03/23/15 141 lb 9.6 oz (64.2 kg)    LABS:  Lab Results  Component Value Date   HGBA1C 5.7 04/04/2016   HGBA1C 5.7 10/06/2015   HGBA1C 5.6 03/18/2015   Lab Results  Component Value Date   MICROALBUR 2.5 (H) 08/03/2014   LDLCALC 70 04/04/2016   CREATININE 1.35 04/04/2016     Office Visit on 04/04/2016  Component Date Value Ref Range Status  . Hgb A1c MFr Bld 04/04/2016 5.7  4.6 - 6.5 % Final  . Sodium 04/04/2016 140  135 - 145 mEq/L Final  . Potassium 04/04/2016 4.4   3.5 - 5.1 mEq/L Final  . Chloride 04/04/2016 103  96 - 112 mEq/L Final  . CO2 04/04/2016 32  19 - 32 mEq/L Final  . Glucose, Bld 04/04/2016 88  70 - 99 mg/dL Final  . BUN 04/04/2016 24* 6 - 23 mg/dL Final  . Creatinine, Ser 04/04/2016 1.35  0.40 - 1.50 mg/dL Final  . Total Bilirubin 04/04/2016 0.5  0.2 - 1.2 mg/dL Final  . Alkaline Phosphatase 04/04/2016 50  39 - 117 U/L Final  . AST 04/04/2016 26  0 - 37 U/L Final  . ALT 04/04/2016 25  0 - 53 U/L Final  . Total Protein 04/04/2016 6.6  6.0 - 8.3 g/dL Final  . Albumin 04/04/2016 3.8  3.5 - 5.2 g/dL Final  . Calcium 04/04/2016 9.9  8.4 - 10.5 mg/dL Final  . GFR 04/04/2016 56.72* >60.00 mL/min Final  . Cholesterol 04/04/2016 143  0 - 200 mg/dL Final  . Triglycerides 04/04/2016 64.0  0.0 - 149.0 mg/dL Final  . HDL 04/04/2016 59.90  >39.00 mg/dL Final  . VLDL 04/04/2016 12.8  0.0 - 40.0 mg/dL Final  . LDL Cholesterol 04/04/2016 70  0 - 99 mg/dL Final  . Total CHOL/HDL Ratio 04/04/2016 2  Final  . NonHDL 04/04/2016 82.86   Final  . TSH 04/04/2016 2.64  0.35 - 4.50 uIU/mL Final      Medication List       Accurate as of 04/04/16  8:59 PM. Always use your most recent med list.          acetaminophen 500 MG tablet Commonly known as:  TYLENOL Take 500 mg by mouth 2 (two) times daily.   albuterol (2.5 MG/3ML) 0.083% nebulizer solution Commonly known as:  PROVENTIL Take 2.5 mg by nebulization 2 (two) times daily.   allopurinol 100 MG tablet Commonly known as:  ZYLOPRIM Take 100 mg by mouth daily.   aspirin 81 MG tablet Take 81 mg by mouth daily.   CALCIUM + D PO Take 1 tablet by mouth daily.   clindamycin 1 % gel Commonly known as:  CLINDAGEL Apply 1 application topically as needed (for irritation).   CVS MELATONIN 3 MG Tabs Generic drug:  Melatonin Take 3 mg by mouth at bedtime.   donepezil 5 MG tablet Commonly known as:  ARICEPT Take 5 mg by mouth at bedtime.   fluticasone 50 MCG/ACT nasal spray Commonly known  as:  FLONASE Place 2 sprays into both nostrils as needed for allergies.   ibuprofen 400 MG tablet Commonly known as:  ADVIL,MOTRIN Take 1 tablet (400 mg total) by mouth every 8 (eight) hours as needed.   lactulose 10 GM/15ML solution Commonly known as:  CHRONULAC Take 10 g by mouth daily as needed for moderate constipation.   loperamide 2 MG capsule Commonly known as:  IMODIUM Take 1 capsule (2 mg total) by mouth 4 (four) times daily as needed for diarrhea or loose stools.   metroNIDAZOLE 500 MG tablet Commonly known as:  FLAGYL Take 1 tablet (500 mg total) by mouth 3 (three) times daily.   mineral oil-hydrophilic petrolatum ointment Apply 1 application topically 2 (two) times daily.   nitrofurantoin (macrocrystal-monohydrate) 100 MG capsule Commonly known as:  MACROBID   nitrofurantoin 100 MG capsule Commonly known as:  MACRODANTIN TAKE ONE CAPSULE BY MOUTH TWICE A DAY FOR 7 DAYS   oxybutynin 5 MG tablet Commonly known as:  DITROPAN Take 5 mg by mouth daily.   pioglitazone-metformin 15-850 MG tablet Commonly known as:  ACTOPLUS MET Take 1 tablet by mouth 2 (two) times daily with a meal.   pravastatin 20 MG tablet Commonly known as:  PRAVACHOL Take 20 mg by mouth daily.   ranitidine 150 MG tablet Commonly known as:  ZANTAC Take 150 mg by mouth daily.   tamsulosin 0.4 MG Caps capsule Commonly known as:  FLOMAX Take 0.4 mg by mouth daily.   VITAMIN B-12 IJ Inject 1 mL as directed.   VOLTAREN 1 % Gel Generic drug:  diclofenac sodium Apply 2 g topically as needed (for shoulder pain).       Allergies: No Known Allergies  Past Medical History:  Diagnosis Date  . Diabetes mellitus without complication ([place])   . Down syndrome     No past surgical history on file.  No family history on file.  Social History:  reports that he has never smoked. He has never used smokeless tobacco. He reports that he does not drink alcohol. His drug history is not on  file.  Review of Systems:   No history of microalbuminuria  His creatinine has been high normal, no recent labs available Not on any antihypertensives   Lab Results  Component Value Date   CREATININE 1.35  04/04/2016   CREATININE 1.36 10/06/2015   CREATININE 1.43 03/18/2015     Lipids: Lipids are controlled, taking pravastatin 20 mg  Lab Results  Component Value Date   CHOL 143 04/04/2016   HDL 59.90 04/04/2016   LDLCALC 70 04/04/2016   TRIG 64.0 04/04/2016   CHOLHDL 2 04/04/2016   Diabetic foot exam done in 03/2015.  He is unreliable with his responses for sensory testing    He reportedly has had his influenza vaccine at the nursing home   Examination:   BP (!) 154/129   Pulse 90   Ht 5' 1"  (1.549 m)   SpO2 95%   There is no height or weight on file to calculate BMI.   No peripheral edema   Diabetic Foot Exam - Simple   Simple Foot Form Diabetic Foot exam was performed with the following findings:  Yes   Visual Inspection See comments:  Yes Sensation Testing Intact to touch and monofilament testing bilaterally:  Yes See comments:  Yes Pulse Check Posterior Tibialis and Dorsalis pulse intact bilaterally:  Yes Comments Flat feet. Appears to have normal sensation but testing is unreliable        ASSESSMENT/ PLAN:   Diabetes type 2  See history of present illness for detailed discussion of his current management, blood sugar patterns and problems identified His blood sugars looked fairly good at home He has lost more weight Apparently but etiology unclear, followed by nursing home physician He is only on low-dose Actoplusmet and will continue A1c to be checked today  History of mild hyperlipidemia and this is treated by PCP  Will check thyroid functions to evaluate his weight loss   Paddy Neis 04/04/2016, 8:59 PM

## 2016-04-07 ENCOUNTER — Emergency Department (HOSPITAL_COMMUNITY)
Admission: EM | Admit: 2016-04-07 | Discharge: 2016-04-07 | Disposition: A | Discharge status: Home / Self Care | Payer: Medicare Other | Attending: Emergency Medicine | Admitting: Emergency Medicine

## 2016-04-07 ENCOUNTER — Emergency Department (HOSPITAL_COMMUNITY): Payer: Medicare Other

## 2016-04-07 ENCOUNTER — Encounter (HOSPITAL_COMMUNITY): Payer: Self-pay | Admitting: *Deleted

## 2016-04-07 DIAGNOSIS — Z79899 Other long term (current) drug therapy: Secondary | ICD-10-CM | POA: Diagnosis not present

## 2016-04-07 DIAGNOSIS — E119 Type 2 diabetes mellitus without complications: Secondary | ICD-10-CM | POA: Insufficient documentation

## 2016-04-07 DIAGNOSIS — Z7982 Long term (current) use of aspirin: Secondary | ICD-10-CM | POA: Diagnosis not present

## 2016-04-07 DIAGNOSIS — F0390 Unspecified dementia without behavioral disturbance: Secondary | ICD-10-CM | POA: Insufficient documentation

## 2016-04-07 DIAGNOSIS — W19XXXA Unspecified fall, initial encounter: Secondary | ICD-10-CM

## 2016-04-07 DIAGNOSIS — L539 Erythematous condition, unspecified: Secondary | ICD-10-CM | POA: Insufficient documentation

## 2016-04-07 DIAGNOSIS — Y92129 Unspecified place in nursing home as the place of occurrence of the external cause: Secondary | ICD-10-CM

## 2016-04-07 LAB — CBC WITH DIFFERENTIAL/PLATELET
BASOS ABS: 0.1 10*3/uL (ref 0.0–0.1)
Basophils Relative: 1 %
Eosinophils Absolute: 0 10*3/uL (ref 0.0–0.7)
Eosinophils Relative: 0 %
HEMATOCRIT: 37.2 % — AB (ref 39.0–52.0)
Hemoglobin: 12.8 g/dL — ABNORMAL LOW (ref 13.0–17.0)
LYMPHS ABS: 1.2 10*3/uL (ref 0.7–4.0)
Lymphocytes Relative: 13 %
MCH: 34 pg (ref 26.0–34.0)
MCHC: 34.4 g/dL (ref 30.0–36.0)
MCV: 98.9 fL (ref 78.0–100.0)
Monocytes Absolute: 1.1 10*3/uL — ABNORMAL HIGH (ref 0.1–1.0)
Monocytes Relative: 12 %
Neutro Abs: 6.8 10*3/uL (ref 1.7–7.7)
Neutrophils Relative %: 74 %
PLATELETS: 184 10*3/uL (ref 150–400)
RBC: 3.76 MIL/uL — AB (ref 4.22–5.81)
RDW: 14.4 % (ref 11.5–15.5)
WBC: 9.2 10*3/uL (ref 4.0–10.5)

## 2016-04-07 LAB — BASIC METABOLIC PANEL
ANION GAP: 9 (ref 5–15)
BUN: 20 mg/dL (ref 6–20)
CALCIUM: 9.2 mg/dL (ref 8.9–10.3)
CO2: 22 mmol/L (ref 22–32)
Chloride: 104 mmol/L (ref 101–111)
Creatinine, Ser: 1.11 mg/dL (ref 0.61–1.24)
Glucose, Bld: 95 mg/dL (ref 65–99)
Potassium: 4.4 mmol/L (ref 3.5–5.1)
SODIUM: 135 mmol/L (ref 135–145)

## 2016-04-07 LAB — URINALYSIS, ROUTINE W REFLEX MICROSCOPIC
Bilirubin Urine: NEGATIVE
Glucose, UA: NEGATIVE mg/dL
Hgb urine dipstick: NEGATIVE
KETONES UR: NEGATIVE mg/dL
LEUKOCYTES UA: NEGATIVE
NITRITE: NEGATIVE
PH: 7.5 (ref 5.0–8.0)
PROTEIN: NEGATIVE mg/dL
Specific Gravity, Urine: 1.012 (ref 1.005–1.030)

## 2016-04-07 NOTE — ED Provider Notes (Signed)
[facility] DEPT Provider Note   CSN: [id number] Arrival date & time: 04/07/16  4270     History   Chief Complaint Chief Complaint  Patient presents with  . Fall    HPI [name] [name] is a 65 y.o. male.  HPI Low 5 caveat. Patient with a history of Down syndrome presents from nursing home facility after being found on the floor. Unknown whether the patient fell. He's had no vomiting since being found. Caretaker at bedside states the patient is at his baseline mental status which is following simple commands and oriented to person. Was transported by EMS for medical clearance. Past Medical History:  Diagnosis Date  . Diabetes mellitus without complication ([place])   . Down syndrome     Patient Active Problem List   Diagnosis Date Noted  . Chest pain 12/29/2014  . Pure hypercholesterolemia 05/14/2013  . DOWNS SYNDROME 09/14/2010  . ABDOMINAL PAIN, EPIGASTRIC 09/14/2010  . Type 2 diabetes mellitus, controlled ([place]) 10/17/2006    History reviewed. No pertinent surgical history.     Home Medications    Prior to Admission medications   Medication Sig Start Date End Date Taking? Authorizing Provider  acetaminophen (TYLENOL) 500 MG tablet Take 500 mg by mouth 2 (two) times daily.     Historical Provider, MD  albuterol (PROVENTIL) (2.5 MG/3ML) 0.083% nebulizer solution Take 2.5 mg by nebulization 2 (two) times daily.     Historical Provider, MD  allopurinol (ZYLOPRIM) 100 MG tablet Take 100 mg by mouth daily.  04/20/13   Historical Provider, MD  aspirin 81 MG tablet Take 81 mg by mouth daily.    Historical Provider, MD  Calcium Carbonate-Vitamin D (CALCIUM + D PO) Take 1 tablet by mouth daily.     Historical Provider, MD  clindamycin (CLINDAGEL) 1 % gel Apply 1 application topically as needed (for irritation).  11/17/14   Historical Provider, MD  CVS MELATONIN 3 MG TABS Take 3 mg by mouth at bedtime.  09/08/14   Historical Provider, MD  Cyanocobalamin (VITAMIN B-12 IJ) Inject 1 mL  as directed.    Historical Provider, MD  donepezil (ARICEPT) 5 MG tablet Take 5 mg by mouth at bedtime.  10/19/14   Historical Provider, MD  fluticasone (FLONASE) 50 MCG/ACT nasal spray Place 2 sprays into both nostrils as needed for allergies.  09/28/14   Historical Provider, MD  ibuprofen (ADVIL,MOTRIN) 400 MG tablet Take 1 tablet (400 mg total) by mouth every 8 (eight) hours as needed. 07/12/14   Jennifer Piepenbrink, PA-C  lactulose (CHRONULAC) 10 GM/15ML solution Take 10 g by mouth daily as needed for moderate constipation.  02/26/13   Historical Provider, MD  loperamide (IMODIUM) 2 MG capsule Take 1 capsule (2 mg total) by mouth 4 (four) times daily as needed for diarrhea or loose stools. 01/01/15   Orpah Greek, MD  metroNIDAZOLE (FLAGYL) 500 MG tablet Take 1 tablet (500 mg total) by mouth 3 (three) times daily. 01/01/15   Orpah Greek, MD  mineral oil-hydrophilic petrolatum (AQUAPHOR) ointment Apply 1 application topically 2 (two) times daily.    Historical Provider, MD  nitrofurantoin (MACRODANTIN) 100 MG capsule TAKE ONE CAPSULE BY MOUTH TWICE A DAY FOR 7 DAYS 12/31/14   Historical Provider, MD  nitrofurantoin, macrocrystal-monohydrate, (MACROBID) 100 MG capsule  12/16/14   Historical Provider, MD  oxybutynin (DITROPAN) 5 MG tablet Take 5 mg by mouth daily.  03/27/14   Historical Provider, MD  pioglitazone-metformin (ACTOPLUS MET) 15-850 MG per tablet Take 1  tablet by mouth 2 (two) times daily with a meal.  04/20/13   Historical Provider, MD  pravastatin (PRAVACHOL) 20 MG tablet Take 20 mg by mouth daily.  04/20/13   Historical Provider, MD  ranitidine (ZANTAC) 150 MG tablet Take 150 mg by mouth daily.  10/19/14   Historical Provider, MD  tamsulosin (FLOMAX) 0.4 MG CAPS capsule Take 0.4 mg by mouth daily.  04/20/13   Historical Provider, MD  VOLTAREN 1 % GEL Apply 2 g topically as needed (for shoulder pain).  09/28/14   Historical Provider, MD    Family History No family history on  file.  Social History Social History  Substance Use Topics  . Smoking status: Never Smoker  . Smokeless tobacco: Never Used  . Alcohol use No     Allergies   Review of patient's allergies indicates no known allergies.   Review of Systems Review of Systems  Unable to perform ROS: Dementia     Physical Exam Updated Vital Signs BP 104/73   Pulse 61   Temp 98.8 F (37.1 C) (Axillary)   Resp 18   SpO2 99%   Physical Exam  Constitutional: He appears well-developed and well-nourished.  Features of Down syndrome. Drowsy but arousable  HENT:  Head: Normocephalic and atraumatic.  Mouth/Throat: Oropharynx is clear and moist.  Patient with erythema to the scalp especially in the right temporal region and over the vertex of the scalp. There is no obvious swelling or hematoma. No deformity.  Eyes: EOM are normal. Pupils are equal, round, and reactive to light.  Neck: Normal range of motion. Neck supple.  Does not appear to have any posterior midline cervical tenderness to palpation. No step-offs or deformity  Cardiovascular: Normal rate and regular rhythm.   Pulmonary/Chest: Effort normal and breath sounds normal.  Abdominal: Soft. Bowel sounds are normal. There is no tenderness. There is no rebound and no guarding.  Musculoskeletal: Normal range of motion. He exhibits no edema or tenderness.  No midline thoracic or lumbar tenderness. Pelvis is stable. Distal pulses are intact.  Neurological: He is alert.  Oriented to person. Follow simple commands. Moves all extremities without focal deficit. Sensation appears to be grossly intact.  Skin: Skin is warm and dry. Capillary refill takes less than 2 seconds. No rash noted.  Nursing note and vitals reviewed.    ED Treatments / Results  Labs (all labs ordered are listed, but only abnormal results are displayed) Labs Reviewed  CBC WITH DIFFERENTIAL/PLATELET - Abnormal; Notable for the following:       Result Value   RBC 3.76 (*)     Hemoglobin 12.8 (*)    HCT 37.2 (*)    Monocytes Absolute 1.1 (*)    All other components within normal limits  URINALYSIS, ROUTINE W REFLEX MICROSCOPIC (NOT AT [facility])  BASIC METABOLIC PANEL  BASIC METABOLIC PANEL    EKG  EKG Interpretation None       Radiology Ct Head Wo Contrast  Result Date: 04/07/2016 CLINICAL DATA:  Found on floor this side bed. Down syndrome. Diabetes. EXAM: CT HEAD WITHOUT CONTRAST CT CERVICAL SPINE WITHOUT CONTRAST TECHNIQUE: Multidetector CT imaging of the head and cervical spine was performed following the standard protocol without intravenous contrast. Multiplanar CT image reconstructions of the cervical spine were also generated. COMPARISON:  10/31/2015 plain film cervical spine. 10/22/2014 head CT FINDINGS: CT HEAD FINDINGS Brain: Cerebral atrophy. Physiologic calcifications in the basal ganglia. No mass lesion, hemorrhage, hydrocephalus, acute infarct, intra-axial, or extra-axial fluid  collection. Vascular: Intracranial carotid atherosclerosis. Skull: No significant soft tissue swelling.  No skull fracture. Sinuses/Orbits: Normal orbits and globes. Hypoplastic frontal sinuses. Other paranasal sinuses and mastoid air cells are clear. Other: None CT CERVICAL SPINE FINDINGS Spinal visualization through the mid T2 level. Prevertebral soft tissues are within normal limits. No apical pneumothorax. Multilevel cervical spondylosis. This results in areas of central canal and neural foraminal narrowing, including at C3-4 C4-5. Maintenance of vertebral body height and alignment. Facet arthropathy, worse on the left including at C3-4 and C4-5. Motion degraded evaluation of the upper cervical spine especially. Incomplete fusion of posterior elements at C1. IMPRESSION: 1. No acute intracranial abnormality. Cerebral atrophy which is age advanced. 2. Motion degradation involving the cervical spine, especially superiorly. Given this factor, no acute findings identified. Advanced  spondylosis. Electronically Signed   By: Abigail Miyamoto M.D.   On: 04/07/2016 12:16   Ct Cervical Spine Wo Contrast  Result Date: 04/07/2016 CLINICAL DATA:  Found on floor this side bed. Down syndrome. Diabetes. EXAM: CT HEAD WITHOUT CONTRAST CT CERVICAL SPINE WITHOUT CONTRAST TECHNIQUE: Multidetector CT imaging of the head and cervical spine was performed following the standard protocol without intravenous contrast. Multiplanar CT image reconstructions of the cervical spine were also generated. COMPARISON:  10/31/2015 plain film cervical spine. 10/22/2014 head CT FINDINGS: CT HEAD FINDINGS Brain: Cerebral atrophy. Physiologic calcifications in the basal ganglia. No mass lesion, hemorrhage, hydrocephalus, acute infarct, intra-axial, or extra-axial fluid collection. Vascular: Intracranial carotid atherosclerosis. Skull: No significant soft tissue swelling.  No skull fracture. Sinuses/Orbits: Normal orbits and globes. Hypoplastic frontal sinuses. Other paranasal sinuses and mastoid air cells are clear. Other: None CT CERVICAL SPINE FINDINGS Spinal visualization through the mid T2 level. Prevertebral soft tissues are within normal limits. No apical pneumothorax. Multilevel cervical spondylosis. This results in areas of central canal and neural foraminal narrowing, including at C3-4 C4-5. Maintenance of vertebral body height and alignment. Facet arthropathy, worse on the left including at C3-4 and C4-5. Motion degraded evaluation of the upper cervical spine especially. Incomplete fusion of posterior elements at C1. IMPRESSION: 1. No acute intracranial abnormality. Cerebral atrophy which is age advanced. 2. Motion degradation involving the cervical spine, especially superiorly. Given this factor, no acute findings identified. Advanced spondylosis. Electronically Signed   By: Abigail Miyamoto M.D.   On: 04/07/2016 12:16    Procedures Procedures (including critical care time)  Medications Ordered in ED Medications -  No data to display   Initial Impression / Assessment and Plan / ED Course  I have reviewed the triage vital signs and the nursing notes.  Pertinent labs & imaging results that were available during my care of the patient were reviewed by me and considered in my medical decision making (see chart for details).  Clinical Course   W/u negative for acute injury. Pt cont to be at baseline. Will d/c back to nursing facility with return precautions.   Final Clinical Impressions(s) / ED Diagnoses   Final diagnoses:  Fall at nursing home, initial encounter    New Prescriptions New Prescriptions   No medications on file     Julianne Rice, MD 04/07/16 1451

## 2016-04-07 NOTE — ED Triage Notes (Signed)
Pt from [facility] (nursing facility).  Pt was found sleeping on the ground this am, initially they were concerned that pt had a fall but it was determined that he did not fall and was simply sleeping on the ground.  Facility would like pt evaluated to ensure that there are no injuries.  No injury per EMS, pt denies any pain.  LOC is at baseline, no changes.

## 2016-04-10 ENCOUNTER — Telehealth: Payer: Self-pay | Admitting: Endocrinology

## 2016-04-10 NOTE — Telephone Encounter (Signed)
Rosey Batheresa from [facility] need patient's lab results. [phone]

## 2016-04-10 NOTE — Telephone Encounter (Signed)
Please fax to them

## 2016-09-13 LAB — LIPID PANEL: LDL Cholesterol: 62 mg/dL

## 2016-09-13 LAB — BASIC METABOLIC PANEL: CREATININE: 1.1 mg/dL (ref 0.6–1.3)

## 2016-09-13 LAB — HEMOGLOBIN A1C: HEMOGLOBIN A1C: 5.5

## 2016-09-13 LAB — TSH: TSH: 4.7 u[IU]/mL (ref 0.41–5.90)

## 2016-10-05 ENCOUNTER — Ambulatory Visit (INDEPENDENT_AMBULATORY_CARE_PROVIDER_SITE_OTHER): Payer: Medicare Other | Admitting: Endocrinology

## 2016-10-05 ENCOUNTER — Encounter: Payer: Self-pay | Admitting: Endocrinology

## 2016-10-05 VITALS — BP 106/64

## 2016-10-05 DIAGNOSIS — E119 Type 2 diabetes mellitus without complications: Secondary | ICD-10-CM | POA: Diagnosis not present

## 2016-10-05 DIAGNOSIS — E038 Other specified hypothyroidism: Secondary | ICD-10-CM | POA: Diagnosis not present

## 2016-10-05 DIAGNOSIS — E063 Autoimmune thyroiditis: Secondary | ICD-10-CM

## 2016-10-05 NOTE — Progress Notes (Signed)
Patient ID: [name] [name], male   DOB: 10-02-1950, 66 y.o.   MRN: [id number]   Reason for Appointment: Diabetes follow-up   History of Present Illness   Diagnosis: Type 2 DIABETES MELITUS, date of diagnosis:  ? 1988     Previous history: He was initially treated with metformin with fairly good control but subsequently needed additional medications including Actos for control. However with weight loss over the last couple of years his Amaryl was tapered off  His A1c has usually been upper normal consistently Actos was started back in place of Januvia when he was losing weight progressively in the past  Recent history:  He has mild diabetes With his weight loss over the last few years he has needed less medication Blood sugars are usually near normal with A1c now 5.5 He has been on Actoplusmet once a day in the evening with good control.  Blood sugars at home are below 100 fasting and ranging from 108 up to 120 at night  Home blood sugar readings are being checked either morning or after supper   Oral hypoglycemic drugs: Actoplusmet once a day      Side effects from medications: None       Monitors blood glucose:  once  a day.    Glucometer:  unknown brand       Blood Glucose readings from home record as above      Meals: 3 meals per day.  Unable to get diet history         Physical activity: exercise: Minimal  Unable to weight the patient today  Wt Readings from Last 3 Encounters:  10/31/15 130 lb (59 kg)  10/06/15 130 lb 9.6 oz (59.2 kg)  03/23/15 141 lb 9.6 oz (64.2 kg)    LABS:  Lab Results  Component Value Date   HGBA1C 5.5 09/13/2016   HGBA1C 5.7 04/04/2016   HGBA1C 5.7 10/06/2015   Lab Results  Component Value Date   MICROALBUR 2.5 (H) 08/03/2014   LDLCALC 62 09/13/2016   CREATININE 1.1 09/13/2016     Office Visit on 10/05/2016  Component Date Value Ref Range Status  . Creatinine 09/13/2016 1.1  0.6 - 1.3 mg/dL Final  . LDL Cholesterol  09/13/2016 62  mg/dL Final  . Hemoglobin A1C 09/13/2016 5.5   Final  . TSH 09/13/2016 4.70  0.41 - 5.90 uIU/mL Final    Allergies as of 10/05/2016   No Known Allergies     Medication List       Accurate as of 10/05/16  8:59 PM. Always use your most recent med list.          acetaminophen 500 MG tablet Commonly known as:  TYLENOL Take 500 mg by mouth 2 (two) times daily.   albuterol (2.5 MG/3ML) 0.083% nebulizer solution Commonly known as:  PROVENTIL Take 2.5 mg by nebulization 2 (two) times daily.   allopurinol 100 MG tablet Commonly known as:  ZYLOPRIM Take 100 mg by mouth daily.   amLODipine 2.5 MG tablet Commonly known as:  NORVASC Take 2.5 mg by mouth daily.   aspirin 81 MG tablet Take 81 mg by mouth daily.   CALCIUM + D PO Take 1 tablet by mouth daily.   clindamycin 1 % gel Commonly known as:  CLINDAGEL Apply 1 application topically as needed (for irritation).   CVS MELATONIN 3 MG Tabs Generic drug:  Melatonin Take 3 mg by mouth at bedtime.   donepezil 5 MG tablet Commonly  known as:  ARICEPT Take 5 mg by mouth at bedtime.   DUODERM CGF BORDER EX Apply 3 each topically as needed.   fluticasone 50 MCG/ACT nasal spray Commonly known as:  FLONASE Place 2 sprays into both nostrils as needed for allergies.   ibuprofen 400 MG tablet Commonly known as:  ADVIL,MOTRIN Take 1 tablet (400 mg total) by mouth every 8 (eight) hours as needed.   lactulose 10 GM/15ML solution Commonly known as:  CHRONULAC Take 10 g by mouth daily as needed for moderate constipation.   loperamide 2 MG capsule Commonly known as:  IMODIUM Take 1 capsule (2 mg total) by mouth 4 (four) times daily as needed for diarrhea or loose stools.   metroNIDAZOLE 500 MG tablet Commonly known as:  FLAGYL Take 1 tablet (500 mg total) by mouth 3 (three) times daily.   mineral oil-hydrophilic petrolatum ointment Apply 1 application topically 2 (two) times daily.   NAMZARIC 28-10 MG  Cp24 Generic drug:  Memantine HCl-Donepezil HCl Take by mouth.   nitrofurantoin (macrocrystal-monohydrate) 100 MG capsule Commonly known as:  MACROBID   nitrofurantoin 100 MG capsule Commonly known as:  MACRODANTIN TAKE ONE CAPSULE BY MOUTH TWICE A DAY FOR 7 DAYS   oxybutynin 5 MG tablet Commonly known as:  DITROPAN Take 5 mg by mouth daily.   pioglitazone-metformin 15-850 MG tablet Commonly known as:  ACTOPLUS MET Take 1 tablet by mouth 2 (two) times daily with a meal.   pravastatin 20 MG tablet Commonly known as:  PRAVACHOL Take 20 mg by mouth daily.   ranitidine 150 MG tablet Commonly known as:  ZANTAC Take 150 mg by mouth daily.   tamsulosin 0.4 MG Caps capsule Commonly known as:  FLOMAX Take 0.4 mg by mouth daily.   VITAMIN B-12 IJ Inject 1 mL as directed.   VOLTAREN 1 % Gel Generic drug:  diclofenac sodium Apply 2 g topically as needed (for shoulder pain).       Allergies: No Known Allergies  Past Medical History:  Diagnosis Date  . Diabetes mellitus without complication ([place])   . Down syndrome     No past surgical history on file.  No family history on file.  Social History:  reports that he has never smoked. He has never used smokeless tobacco. He reports that he does not drink alcohol. His drug history is not on file.  Review of Systems:  THYROID: His TSH was minimally increased last month Apparently he has not been on medications before and not clear why his nursing home physician has started 88 g levothyroxine Lab Results  Component Value Date   TSH 4.70 09/13/2016     His creatinine has been high normal,Recently better Not on any antihypertensives   Lab Results  Component Value Date   CREATININE 1.1 09/13/2016   CREATININE 1.11 04/07/2016   CREATININE 1.35 04/04/2016     Lipids: Lipids are controlled, taking pravastatin 20 mg  Lab Results  Component Value Date   CHOL 143 04/04/2016   HDL 59.90 04/04/2016   LDLCALC 62  09/13/2016   TRIG 64.0 04/04/2016   CHOLHDL 2 04/04/2016   Diabetic foot exam done in 03/2016.  He is unreliable with his responses for sensory testing      Examination:   BP 106/64   There is no height or weight on file to calculate BMI.   No peripheral edema  Thyroid not palpable He looks asthenic, is noncommunicative   ASSESSMENT/ PLAN:   Diabetes type 2  See history of present illness for description  of his current management, blood sugar patterns  His blood sugars looked fairly good at home and A1c is 5.5 He probably does not have diabetes now with his weight loss  Since he is taking only a small dose of Actoplusmet will try to stop this for now  THYROID: His TSH is only 4.7, not clear if he is symptomatic Since he has Down syndrome may consider starting supplementation but only with 25 g and will discontinue the 88 g that has been ordered  History of mild hyperlipidemia and this is treated by PCP  Patient Instructions  THYROID medication: Discontinue 88 g levothyroxine Start 25 g levothyroxine  Please fax results of TSH in 6 weeks  Stop checking blood sugars fasting and may check blood sugar only at 8 PM  Stop pioglitazone/metformin     [name] [name] 10/05/2016, 8:59 PM

## 2016-10-05 NOTE — Patient Instructions (Signed)
THYROID medication: Discontinue 88 g levothyroxine Start 25 g levothyroxine  Please fax results of TSH in 6 weeks  Stop checking blood sugars fasting and may check blood sugar only at 8 PM  Stop pioglitazone/metformin

## 2016-10-22 ENCOUNTER — Inpatient Hospital Stay (HOSPITAL_COMMUNITY)
Admission: EM | Admit: 2016-10-22 | Discharge: 2016-10-24 | DRG: 871 | Disposition: A | Discharge status: Home / Self Care | Payer: Medicare Other | Attending: Family Medicine | Admitting: Family Medicine

## 2016-10-22 ENCOUNTER — Emergency Department (HOSPITAL_COMMUNITY): Payer: Medicare Other

## 2016-10-22 ENCOUNTER — Encounter (HOSPITAL_COMMUNITY): Payer: Self-pay | Admitting: Emergency Medicine

## 2016-10-22 DIAGNOSIS — Z7951 Long term (current) use of inhaled steroids: Secondary | ICD-10-CM

## 2016-10-22 DIAGNOSIS — A4151 Sepsis due to Escherichia coli [E. coli]: Principal | ICD-10-CM | POA: Diagnosis present

## 2016-10-22 DIAGNOSIS — Q909 Down syndrome, unspecified: Secondary | ICD-10-CM | POA: Diagnosis not present

## 2016-10-22 DIAGNOSIS — E785 Hyperlipidemia, unspecified: Secondary | ICD-10-CM | POA: Diagnosis present

## 2016-10-22 DIAGNOSIS — E039 Hypothyroidism, unspecified: Secondary | ICD-10-CM | POA: Diagnosis present

## 2016-10-22 DIAGNOSIS — E860 Dehydration: Secondary | ICD-10-CM | POA: Diagnosis present

## 2016-10-22 DIAGNOSIS — N179 Acute kidney failure, unspecified: Secondary | ICD-10-CM | POA: Diagnosis present

## 2016-10-22 DIAGNOSIS — E118 Type 2 diabetes mellitus with unspecified complications: Secondary | ICD-10-CM | POA: Diagnosis not present

## 2016-10-22 DIAGNOSIS — E119 Type 2 diabetes mellitus without complications: Secondary | ICD-10-CM

## 2016-10-22 DIAGNOSIS — A419 Sepsis, unspecified organism: Secondary | ICD-10-CM | POA: Diagnosis not present

## 2016-10-22 DIAGNOSIS — I959 Hypotension, unspecified: Secondary | ICD-10-CM | POA: Diagnosis not present

## 2016-10-22 DIAGNOSIS — Z993 Dependence on wheelchair: Secondary | ICD-10-CM

## 2016-10-22 DIAGNOSIS — Z7982 Long term (current) use of aspirin: Secondary | ICD-10-CM

## 2016-10-22 DIAGNOSIS — R4182 Altered mental status, unspecified: Secondary | ICD-10-CM | POA: Diagnosis not present

## 2016-10-22 DIAGNOSIS — I129 Hypertensive chronic kidney disease with stage 1 through stage 4 chronic kidney disease, or unspecified chronic kidney disease: Secondary | ICD-10-CM | POA: Diagnosis present

## 2016-10-22 DIAGNOSIS — N183 Chronic kidney disease, stage 3 unspecified: Secondary | ICD-10-CM | POA: Diagnosis present

## 2016-10-22 DIAGNOSIS — R7881 Bacteremia: Secondary | ICD-10-CM | POA: Diagnosis present

## 2016-10-22 DIAGNOSIS — N3000 Acute cystitis without hematuria: Secondary | ICD-10-CM | POA: Diagnosis present

## 2016-10-22 DIAGNOSIS — N390 Urinary tract infection, site not specified: Secondary | ICD-10-CM | POA: Diagnosis present

## 2016-10-22 DIAGNOSIS — E1122 Type 2 diabetes mellitus with diabetic chronic kidney disease: Secondary | ICD-10-CM | POA: Diagnosis present

## 2016-10-22 DIAGNOSIS — G9341 Metabolic encephalopathy: Secondary | ICD-10-CM | POA: Diagnosis present

## 2016-10-22 DIAGNOSIS — D638 Anemia in other chronic diseases classified elsewhere: Secondary | ICD-10-CM | POA: Diagnosis present

## 2016-10-22 DIAGNOSIS — D649 Anemia, unspecified: Secondary | ICD-10-CM | POA: Diagnosis present

## 2016-10-22 DIAGNOSIS — Z79899 Other long term (current) drug therapy: Secondary | ICD-10-CM

## 2016-10-22 LAB — I-STAT CG4 LACTIC ACID, ED: LACTIC ACID, VENOUS: 0.99 mmol/L (ref 0.5–1.9)

## 2016-10-22 LAB — BLOOD CULTURE ID PANEL (REFLEXED)
Acinetobacter baumannii: NOT DETECTED
CANDIDA ALBICANS: NOT DETECTED
CANDIDA GLABRATA: NOT DETECTED
Candida krusei: NOT DETECTED
Candida parapsilosis: NOT DETECTED
Candida tropicalis: NOT DETECTED
Carbapenem resistance: NOT DETECTED
ENTEROBACTER CLOACAE COMPLEX: NOT DETECTED
ENTEROBACTERIACEAE SPECIES: DETECTED — AB
ENTEROCOCCUS SPECIES: NOT DETECTED
ESCHERICHIA COLI: DETECTED — AB
Haemophilus influenzae: NOT DETECTED
KLEBSIELLA OXYTOCA: NOT DETECTED
Klebsiella pneumoniae: NOT DETECTED
LISTERIA MONOCYTOGENES: NOT DETECTED
NEISSERIA MENINGITIDIS: NOT DETECTED
PSEUDOMONAS AERUGINOSA: NOT DETECTED
Proteus species: NOT DETECTED
STREPTOCOCCUS AGALACTIAE: NOT DETECTED
STREPTOCOCCUS PNEUMONIAE: NOT DETECTED
STREPTOCOCCUS PYOGENES: NOT DETECTED
Serratia marcescens: NOT DETECTED
Staphylococcus aureus (BCID): NOT DETECTED
Staphylococcus species: NOT DETECTED
Streptococcus species: NOT DETECTED

## 2016-10-22 LAB — HEPATIC FUNCTION PANEL
ALT: 18 U/L (ref 17–63)
AST: 24 U/L (ref 15–41)
Albumin: 2.7 g/dL — ABNORMAL LOW (ref 3.5–5.0)
Alkaline Phosphatase: 82 U/L (ref 38–126)
BILIRUBIN INDIRECT: 0.8 mg/dL (ref 0.3–0.9)
Bilirubin, Direct: 0.2 mg/dL (ref 0.1–0.5)
TOTAL PROTEIN: 6.2 g/dL — AB (ref 6.5–8.1)
Total Bilirubin: 1 mg/dL (ref 0.3–1.2)

## 2016-10-22 LAB — COMPREHENSIVE METABOLIC PANEL
ALK PHOS: 63 U/L (ref 38–126)
ALT: 15 U/L — ABNORMAL LOW (ref 17–63)
ANION GAP: 7 (ref 5–15)
AST: 20 U/L (ref 15–41)
Albumin: 2.8 g/dL — ABNORMAL LOW (ref 3.5–5.0)
BUN: 35 mg/dL — ABNORMAL HIGH (ref 6–20)
CALCIUM: 9.1 mg/dL (ref 8.9–10.3)
CO2: 29 mmol/L (ref 22–32)
CREATININE: 1.66 mg/dL — AB (ref 0.61–1.24)
Chloride: 109 mmol/L (ref 101–111)
GFR calc non Af Amer: 42 mL/min — ABNORMAL LOW (ref >60)
GFR, EST AFRICAN AMERICAN: 49 mL/min — AB (ref >60)
Glucose, Bld: 115 mg/dL — ABNORMAL HIGH (ref 65–99)
Potassium: 4 mmol/L (ref 3.5–5.1)
SODIUM: 145 mmol/L (ref 135–145)
TOTAL PROTEIN: 6.1 g/dL — AB (ref 6.5–8.1)
Total Bilirubin: 0.7 mg/dL (ref 0.3–1.2)

## 2016-10-22 LAB — PROTIME-INR
INR: 1.28
Prothrombin Time: 16.1 seconds — ABNORMAL HIGH (ref 11.4–15.2)

## 2016-10-22 LAB — CBG MONITORING, ED
GLUCOSE-CAPILLARY: 98 mg/dL (ref 65–99)
Glucose-Capillary: 123 mg/dL — ABNORMAL HIGH (ref 65–99)

## 2016-10-22 LAB — CBC
HCT: 34 % — ABNORMAL LOW (ref 39.0–52.0)
HEMATOCRIT: 33.6 % — AB (ref 39.0–52.0)
HEMOGLOBIN: 10.9 g/dL — AB (ref 13.0–17.0)
HEMOGLOBIN: 11.1 g/dL — AB (ref 13.0–17.0)
MCH: 30.8 pg (ref 26.0–34.0)
MCH: 31.4 pg (ref 26.0–34.0)
MCHC: 32.1 g/dL (ref 30.0–36.0)
MCHC: 33 g/dL (ref 30.0–36.0)
MCV: 96 fL (ref 78.0–100.0)
MCV: 94.9 fL (ref 78.0–100.0)
PLATELETS: 175 10*3/uL (ref 150–400)
PLATELETS: 166 10*3/uL (ref 150–400)
RBC: 3.54 MIL/uL — ABNORMAL LOW (ref 4.22–5.81)
RBC: 3.54 MIL/uL — ABNORMAL LOW (ref 4.22–5.81)
RDW: 16.6 % — ABNORMAL HIGH (ref 11.5–15.5)
RDW: 16.5 % — ABNORMAL HIGH (ref 11.5–15.5)
WBC: 11 10*3/uL — ABNORMAL HIGH (ref 4.0–10.5)
WBC: 12.5 10*3/uL — AB (ref 4.0–10.5)

## 2016-10-22 LAB — URINALYSIS, ROUTINE W REFLEX MICROSCOPIC
BILIRUBIN URINE: NEGATIVE
Glucose, UA: NEGATIVE mg/dL
Ketones, ur: NEGATIVE mg/dL
NITRITE: POSITIVE — AB
Protein, ur: NEGATIVE mg/dL
SQUAMOUS EPITHELIAL / LPF: NONE SEEN
Specific Gravity, Urine: 1.009 (ref 1.005–1.030)
pH: 6 (ref 5.0–8.0)

## 2016-10-22 LAB — CREATININE, SERUM
Creatinine, Ser: 1.43 mg/dL — ABNORMAL HIGH (ref 0.61–1.24)
GFR calc non Af Amer: 51 mL/min — ABNORMAL LOW (ref >60)
GFR, EST AFRICAN AMERICAN: 59 mL/min — AB (ref >60)

## 2016-10-22 LAB — LACTIC ACID, PLASMA
LACTIC ACID, VENOUS: 1.1 mmol/L (ref 0.5–1.9)
Lactic Acid, Venous: 1.1 mmol/L (ref 0.5–1.9)

## 2016-10-22 LAB — I-STAT TROPONIN, ED: TROPONIN I, POC: 0.01 ng/mL (ref 0.00–0.08)

## 2016-10-22 LAB — PROCALCITONIN: Procalcitonin: 15.52 ng/mL

## 2016-10-22 MED ORDER — DEXTROSE 5 % IV SOLN: 1.0000 g | INTRAVENOUS | Status: DC

## 2016-10-22 MED ORDER — INSULIN ASPART 100 UNIT/ML ~~LOC~~ SOLN: 0.0000 [IU] | Freq: Three times a day (TID) | SUBCUTANEOUS | Status: DC

## 2016-10-22 MED ORDER — DONEPEZIL HCL 10 MG PO TABS
10.0000 mg | ORAL_TABLET | Freq: Every day | ORAL | Status: DC
  Administered 2016-10-23 – 2016-10-24 (×2): 10 mg via ORAL
  Filled 2016-10-22 (×2): qty 1

## 2016-10-22 MED ORDER — MEMANTINE HCL ER 28 MG PO CP24
28.0000 mg | ORAL_CAPSULE | Freq: Every day | ORAL | Status: DC
  Administered 2016-10-23 – 2016-10-24 (×2): 28 mg via ORAL
  Filled 2016-10-22 (×2): qty 1

## 2016-10-22 MED ORDER — SODIUM CHLORIDE 0.9 % IV SOLN
INTRAVENOUS | Status: DC
  Administered 2016-10-23 – 2016-10-24 (×4): via INTRAVENOUS

## 2016-10-22 MED ORDER — FLUTICASONE PROPIONATE 50 MCG/ACT NA SUSP
2.0000 | NASAL | Status: DC | PRN
  Filled 2016-10-22: qty 16

## 2016-10-22 MED ORDER — SODIUM CHLORIDE 0.9 % IV SOLN
1250.0000 mg | Freq: Once | INTRAVENOUS | Status: AC
  Administered 2016-10-22: 1250 mg via INTRAVENOUS
  Filled 2016-10-22: qty 1250

## 2016-10-22 MED ORDER — ALBUTEROL SULFATE (2.5 MG/3ML) 0.083% IN NEBU: 2.5000 mg | INHALATION_SOLUTION | Freq: Four times a day (QID) | RESPIRATORY_TRACT | Status: DC | PRN

## 2016-10-22 MED ORDER — PRAVASTATIN SODIUM 20 MG PO TABS
20.0000 mg | ORAL_TABLET | Freq: Every day | ORAL | Status: DC
  Administered 2016-10-23 – 2016-10-24 (×2): 20 mg via ORAL
  Filled 2016-10-22 (×2): qty 1

## 2016-10-22 MED ORDER — VANCOMYCIN HCL IN DEXTROSE 1-5 GM/200ML-% IV SOLN: 1000.0000 mg | Freq: Once | INTRAVENOUS | Status: DC

## 2016-10-22 MED ORDER — VANCOMYCIN HCL IN DEXTROSE 750-5 MG/150ML-% IV SOLN
750.0000 mg | INTRAVENOUS | Status: DC
  Filled 2016-10-22: qty 150

## 2016-10-22 MED ORDER — OXYBUTYNIN CHLORIDE 5 MG PO TABS
2.5000 mg | ORAL_TABLET | Freq: Every day | ORAL | Status: DC
  Administered 2016-10-23 – 2016-10-24 (×2): 2.5 mg via ORAL
  Filled 2016-10-22 (×2): qty 1

## 2016-10-22 MED ORDER — ACETAMINOPHEN 325 MG PO TABS: 650.0000 mg | ORAL_TABLET | Freq: Four times a day (QID) | ORAL | Status: DC | PRN

## 2016-10-22 MED ORDER — PIPERACILLIN-TAZOBACTAM 3.375 G IVPB 30 MIN
3.3750 g | Freq: Once | INTRAVENOUS | Status: AC
  Administered 2016-10-22: 3.375 g via INTRAVENOUS
  Filled 2016-10-22: qty 50

## 2016-10-22 MED ORDER — ENOXAPARIN SODIUM 40 MG/0.4ML ~~LOC~~ SOLN
40.0000 mg | SUBCUTANEOUS | Status: DC
  Administered 2016-10-22 – 2016-10-23 (×2): 40 mg via SUBCUTANEOUS
  Filled 2016-10-22: qty 0.4

## 2016-10-22 MED ORDER — MEMANTINE HCL-DONEPEZIL HCL ER 28-10 MG PO CP24: 1.0000 | ORAL_CAPSULE | Freq: Every day | ORAL | Status: DC

## 2016-10-22 MED ORDER — SODIUM CHLORIDE 0.9 % IV BOLUS (SEPSIS)
1000.0000 mL | Freq: Once | INTRAVENOUS | Status: AC
  Administered 2016-10-22: 1000 mL via INTRAVENOUS

## 2016-10-22 MED ORDER — PIPERACILLIN-TAZOBACTAM 3.375 G IVPB
3.3750 g | Freq: Three times a day (TID) | INTRAVENOUS | Status: DC
  Filled 2016-10-22 (×2): qty 50

## 2016-10-22 MED ORDER — FAMOTIDINE 20 MG PO TABS
20.0000 mg | ORAL_TABLET | Freq: Every day | ORAL | Status: DC
  Administered 2016-10-23 – 2016-10-24 (×2): 20 mg via ORAL
  Filled 2016-10-22 (×2): qty 1

## 2016-10-22 MED ORDER — ACETAMINOPHEN 650 MG RE SUPP
650.0000 mg | Freq: Four times a day (QID) | RECTAL | Status: DC | PRN
  Administered 2016-10-22: 650 mg via RECTAL
  Filled 2016-10-22: qty 1

## 2016-10-22 MED ORDER — PIPERACILLIN-TAZOBACTAM 3.375 G IVPB: 3.3750 g | Freq: Three times a day (TID) | INTRAVENOUS | Status: DC

## 2016-10-22 MED ORDER — VANCOMYCIN HCL IN DEXTROSE 750-5 MG/150ML-% IV SOLN: 750.0000 mg | INTRAVENOUS | Status: DC

## 2016-10-22 MED ORDER — LEVOTHYROXINE SODIUM 25 MCG PO TABS
25.0000 ug | ORAL_TABLET | Freq: Every day | ORAL | Status: DC
  Administered 2016-10-23 – 2016-10-24 (×2): 25 ug via ORAL
  Filled 2016-10-22 (×2): qty 1

## 2016-10-22 MED ORDER — ALBUTEROL SULFATE (2.5 MG/3ML) 0.083% IN NEBU
2.5000 mg | INHALATION_SOLUTION | Freq: Two times a day (BID) | RESPIRATORY_TRACT | Status: DC
  Administered 2016-10-22 – 2016-10-24 (×4): 2.5 mg via RESPIRATORY_TRACT
  Filled 2016-10-22 (×4): qty 3

## 2016-10-22 MED ORDER — ASPIRIN EC 81 MG PO TBEC
81.0000 mg | DELAYED_RELEASE_TABLET | Freq: Every day | ORAL | Status: DC
  Administered 2016-10-23 – 2016-10-24 (×2): 81 mg via ORAL
  Filled 2016-10-22 (×2): qty 1

## 2016-10-22 MED ORDER — TAMSULOSIN HCL 0.4 MG PO CAPS
0.4000 mg | ORAL_CAPSULE | Freq: Every day | ORAL | Status: DC
  Administered 2016-10-23 – 2016-10-24 (×2): 0.4 mg via ORAL
  Filled 2016-10-22 (×2): qty 1

## 2016-10-22 NOTE — ED Notes (Signed)
Lennie MuckleKelly Pickard RN from facility called to get update on patient.

## 2016-10-22 NOTE — ED Triage Notes (Signed)
Arrived via EMS from [facility]. Called for increased lethargy and altered mental status.  History of Down Syndrome and normally fuzzy and alert unknown time patient became lethargic. Nursing home reported to EMS fever last night unknown result. States to EMS gave motrin in AM. EMS reported BP 92/56 manual. EMS administered 0.9 NS 700ML Upon arrival BP 109/60 and patient more alert. CBG 132

## 2016-10-22 NOTE — ED Notes (Signed)
Unable to draw blood spoke with phlebotomy who will attempt.

## 2016-10-22 NOTE — ED Notes (Signed)
Doctor at bedside.

## 2016-10-22 NOTE — ED Notes (Signed)
CBG 98  

## 2016-10-22 NOTE — ED Notes (Signed)
Admitting provider made aware of skin tears x2 to sacral area. Mepilex dressing placed on sacrum.

## 2016-10-22 NOTE — ED Notes (Signed)
Patient and caretaker at bedside. Patient alert at times fuzzy per her normal. Able to obtain urine specimen without in and out cath. Doctor notified at bedside after urine obtained.

## 2016-10-22 NOTE — ED Notes (Signed)
Admit Doctor at bedside.  

## 2016-10-22 NOTE — ED Notes (Signed)
Pt. Incontinent of urine and stool. Pt. Bedding and gown changed.

## 2016-10-22 NOTE — ED Notes (Signed)
Reordered dinner

## 2016-10-22 NOTE — Progress Notes (Signed)
Pharmacy Antibiotic Note  [name] LiasJohn W [name] is a 66 y.o. male admitted on 10/22/2016 with sepsis.  Pharmacy has been consulted for vancomycin/zosyn dosing. Afebrile, wbc 11. SCr up 1.66 on admit (1.1 ~3610mo ago), CrCl~38.  Plan: Zosyn 3.375g IV (30min inf) x1; then 3.375g IV q8h (4h inf) Vancomycin 1250mg  IV x1; then 750mg  IV q24h Monitor clinical progress, c/s, renal function, abx plan/LOT Vancomycin trough as indicated   Weight: 130 lb (59 kg)  Temp (24hrs), Avg:98 F (36.7 C), Min:98 F (36.7 C), Max:98 F (36.7 C)  No results for input(s): WBC, CREATININE, LATICACIDVEN, VANCOTROUGH, VANCOPEAK, VANCORANDOM, GENTTROUGH, GENTPEAK, GENTRANDOM, TOBRATROUGH, TOBRAPEAK, TOBRARND, AMIKACINPEAK, AMIKACINTROU, AMIKACIN in the last 168 hours.  CrCl cannot be calculated (Patient's most recent lab result is older than the maximum 21 days allowed.).    No Known Allergies  Babs BertinHaley Eleftherios [name], PharmD, BCPS Clinical Pharmacist 10/22/2016 10:16 AM

## 2016-10-22 NOTE — ED Provider Notes (Signed)
[facility]-EMERGENCY DEPT Provider Note   CSN: [id number] Arrival date & time: 10/22/16  0935     History   Chief Complaint Chief Complaint  Patient presents with  . Fatigue  . Altered Mental Status    HPI TIP ATIENZA is a 66 y.o. male.  HPI Patient presents to the emergency room for evaluation of increased lethargy and altered mental status. Patient has a history of Down syndrome, and early dementia. At baseline, the patient has to use a wheelchair, with maximal assistance he can sometimes stand and pivot. He used to be able to feed himself but more recently he asked to be fed by his caregivers and sometimes he does not want to eat. He does not have a feeding tube.  Over the weekend, according to his caregiver, he had a fever. She is not aware of him having any other symptoms such as cough, congestion, vomiting or diarrhea. He does have a history of urinary tract infections in the past. This morning they noticed increased lethargy and change in his behavior. When EMS arrived they noted his blood pressure was 92/56.  Usually he is more active than wall answer simple questions but this morning he was listless.  The patient is awake in the emergency room. He will tell me his name. When I ask him what is bothering him he denies having any complaints. He states he feels fine. Past Medical History:  Diagnosis Date  . Diabetes mellitus without complication (HCC)   . Down syndrome     Patient Active Problem List   Diagnosis Date Noted  . Urinary tract infection 10/22/2016  . CKD (chronic kidney disease) 10/22/2016  . Anemia 10/22/2016  . Chest pain 12/29/2014  . Pure hypercholesterolemia 05/14/2013  . DOWNS SYNDROME 09/14/2010  . ABDOMINAL PAIN, EPIGASTRIC 09/14/2010  . Type 2 diabetes mellitus, controlled (HCC) 10/17/2006    History reviewed. No pertinent surgical history.     Home Medications    Prior to Admission medications   Medication Sig Start Date End Date Taking? Authorizing  Provider  albuterol (PROVENTIL HFA;VENTOLIN HFA) 108 (90 Base) MCG/ACT inhaler Inhale 2 puffs into the lungs every 6 (six) hours as needed for wheezing or shortness of breath (bronchospasm).   Yes Historical Provider, MD  albuterol (PROVENTIL) (2.5 MG/3ML) 0.083% nebulizer solution Take 2.5 mg by nebulization 2 (two) times daily.    Yes Historical Provider, MD  allopurinol (ZYLOPRIM) 100 MG tablet Take 100 mg by mouth at bedtime.  04/20/13  Yes Historical Provider, MD  amLODipine (NORVASC) 2.5 MG tablet Take 2.5 mg by mouth every evening.    Yes Historical Provider, MD  aspirin 81 MG tablet Take 81 mg by mouth daily.   Yes Historical Provider, MD  Calcium Carbonate-Vitamin D (CALCIUM + D PO) Take 1 tablet by mouth daily.    Yes Historical Provider, MD  clindamycin (CLINDAGEL) 1 % gel Apply 1 application topically as needed (for irritation).  11/17/14  Yes Historical Provider, MD  [employer] MELATONIN 3 MG TABS Take 3 mg by mouth at bedtime.  09/08/14  Yes Historical Provider, MD  fluticasone (FLONASE) 50 MCG/ACT nasal spray Place 2 sprays into both nostrils as needed for allergies.  09/28/14  Yes Historical Provider, MD  lactulose (CHRONULAC) 10 GM/15ML solution Take 6 g by mouth daily. Per MD 10mL 02/26/13  Yes Historical Provider, MD  levothyroxine (SYNTHROID, LEVOTHROID) 25 MCG tablet Take 25 mcg by mouth daily before breakfast.   Yes Historical Provider, MD  Memantine HCl-Donepezil  HCl (NAMZARIC) 28-10 MG CP24 Take 1 capsule by mouth daily.    Yes Historical Provider, MD  mineral oil-hydrophilic petrolatum (AQUAPHOR) ointment Apply 1 application topically 2 (two) times daily.   Yes Historical Provider, MD  oxybutynin (DITROPAN) 5 MG tablet Take 2.5 mg by mouth daily.  03/27/14  Yes Historical Provider, MD  pravastatin (PRAVACHOL) 20 MG tablet Take 20 mg by mouth daily.  04/20/13  Yes Historical Provider, MD  ranitidine (ZANTAC) 150 MG tablet Take 150 mg by mouth 2 (two) times daily.  10/19/14  Yes Historical  Provider, MD  tamsulosin (FLOMAX) 0.4 MG CAPS capsule Take 0.4 mg by mouth daily.  04/20/13  Yes Historical Provider, MD  Control Gel Formula Dressing (DUODERM CGF BORDER EX) Apply 3 each topically as needed.    Historical Provider, MD  ibuprofen (ADVIL,MOTRIN) 400 MG tablet Take 1 tablet (400 mg total) by mouth every 8 (eight) hours as needed. Patient not taking: Reported on 10/05/2016 07/12/14   Francee PiccoloJennifer Piepenbrink, PA-C  loperamide (IMODIUM) 2 MG capsule Take 1 capsule (2 mg total) by mouth 4 (four) times daily as needed for diarrhea or loose stools. Patient not taking: Reported on 10/05/2016 01/01/15   Gilda Creasehristopher J Pollina, MD  metroNIDAZOLE (FLAGYL) 500 MG tablet Take 1 tablet (500 mg total) by mouth 3 (three) times daily. Patient not taking: Reported on 10/05/2016 01/01/15   Gilda Creasehristopher J Pollina, MD    Family History No family history on file.  Social History Social History  Substance Use Topics  . Smoking status: Never Smoker  . Smokeless tobacco: Never Used  . Alcohol use No     Allergies   Patient has no known allergies.   Review of Systems Review of Systems  All other systems reviewed and are negative.    Physical Exam Updated Vital Signs BP 155/84   Pulse 85   Temp 98 F (36.7 C) (Rectal)   Resp 18   Wt 59 kg   SpO2 96%   BMI 24.56 kg/m   Physical Exam  Constitutional: No distress.  HENT:  Head: Normocephalic and atraumatic.  Right Ear: External ear normal.  Left Ear: External ear normal.  Mucous membranes are very dry  Eyes: Conjunctivae are normal. Right eye exhibits no discharge. Left eye exhibits no discharge. No scleral icterus.  Neck: Neck supple. No tracheal deviation present.  Cardiovascular: Normal rate, regular rhythm and intact distal pulses.   Pulmonary/Chest: Effort normal and breath sounds normal. No stridor. No respiratory distress. He has no wheezes. He has no rales.  Abdominal: Soft. Bowel sounds are normal. He exhibits no distension.  There is no tenderness. There is no rebound and no guarding.  Musculoskeletal: He exhibits no edema or tenderness.  Neurological: He is alert. No cranial nerve deficit (no facial droop, extraocular movements intact, no slurred speech) or sensory deficit. He displays no seizure activity.  Generalized weakness, atrophy noted in the lower extremities  Skin: Skin is warm and dry. No rash noted.  Psychiatric: He has a normal mood and affect.  Nursing note and vitals reviewed.    ED Treatments / Results  Labs (all labs ordered are listed, but only abnormal results are displayed) Labs Reviewed  COMPREHENSIVE METABOLIC PANEL - Abnormal; Notable for the following:       Result Value   Glucose, Bld 115 (*)    BUN 35 (*)    Creatinine, Ser 1.66 (*)    Total Protein 6.1 (*)    Albumin 2.8 (*)  ALT 15 (*)    GFR calc non Af Amer 42 (*)    GFR calc Af Amer 49 (*)    All other components within normal limits  CBC - Abnormal; Notable for the following:    WBC 11.0 (*)    RBC 3.54 (*)    Hemoglobin 10.9 (*)    HCT 34.0 (*)    RDW 16.6 (*)    All other components within normal limits  PROTIME-INR - Abnormal; Notable for the following:    Prothrombin Time 16.1 (*)    All other components within normal limits  URINALYSIS, ROUTINE W REFLEX MICROSCOPIC - Abnormal; Notable for the following:    Hgb urine dipstick MODERATE (*)    Nitrite POSITIVE (*)    Leukocytes, UA MODERATE (*)    Bacteria, UA MANY (*)    All other components within normal limits  CULTURE, BLOOD (ROUTINE X 2)  CULTURE, BLOOD (ROUTINE X 2)  URINE CULTURE  URINE CULTURE  CBC  CREATININE, SERUM  HEPATIC FUNCTION PANEL  CBG MONITORING, ED  I-STAT CG4 LACTIC ACID, ED  I-STAT TROPOININ, ED  I-STAT CG4 LACTIC ACID, ED    EKG  EKG Interpretation  Date/Time:  Monday October 22 2016 09:38:53 EST Ventricular Rate:  58 PR Interval:    QRS Duration: 152 QT Interval:  447 QTC Calculation: 439 R Axis:   -13 Text  Interpretation:  Sinus rhythm Right bundle branch block Abnormal lateral Q waves No significant change since last tracing Confirmed by Kincaid Tiger  MD-J, Dilyn Osoria ([id number]) on 10/22/2016 9:41:22 AM       Radiology Dg Chest Portable 1 View  Result Date: 10/22/2016 CLINICAL DATA:  Altered mental status. Lethargy this morning. History of diabetes and Downs syndrome. EXAM: PORTABLE CHEST 1 VIEW COMPARISON:  01/01/2015 and 12/29/2014 radiographs FINDINGS: The heart size and mediastinal contours are stable. There is mild atelectasis at both lung bases associated with suboptimal inspiration. No edema, confluent airspace opacity, pleural effusion or pneumothorax. No acute osseous findings are seen. There are glenohumeral degenerative changes bilaterally with possible posttraumatic deformity of the right humeral neck. Cholecystectomy clips are noted. IMPRESSION: Suboptimal inspiration and mild bibasilar atelectasis. No focal airspace disease. Electronically Signed   By: Carey Bullocks M.D.   On: 10/22/2016 10:28    Procedures .Critical Care Performed by: Linwood Dibbles Authorized by: Linwood Dibbles   Critical care provider statement:    Critical care time (minutes):  30   Critical care was necessary to treat or prevent imminent or life-threatening deterioration of the following conditions:  Circulatory failure   Critical care was time spent personally by me on the following activities:  Discussions with consultants, evaluation of patient's response to treatment, examination of patient, ordering and performing treatments and interventions, ordering and review of laboratory studies, ordering and review of radiographic studies, pulse oximetry, re-evaluation of patient's condition, obtaining history from patient or surrogate and review of old charts    (including critical care time)  Medications Ordered in ED Medications  piperacillin-tazobactam (ZOSYN) IVPB 3.375 g (not administered)  vancomycin (VANCOCIN) IVPB 750 mg/150  ml premix (not administered)  albuterol (PROVENTIL HFA;VENTOLIN HFA) 108 (90 Base) MCG/ACT inhaler 2 puff (not administered)  levothyroxine (SYNTHROID, LEVOTHROID) tablet 25 mcg (not administered)  Memantine HCl-Donepezil HCl 28-10 MG CP24 1 capsule (not administered)  fluticasone (FLONASE) 50 MCG/ACT nasal spray 2 spray (not administered)  famotidine (PEPCID) tablet 20 mg (not administered)  oxybutynin (DITROPAN) tablet 2.5 mg (not administered)  albuterol (PROVENTIL) (2.5  MG/3ML) 0.083% nebulizer solution 2.5 mg (not administered)  aspirin tablet 81 mg (not administered)  pravastatin (PRAVACHOL) tablet 20 mg (not administered)  tamsulosin (FLOMAX) capsule 0.4 mg (not administered)  enoxaparin (LOVENOX) injection 40 mg (not administered)  0.9 %  sodium chloride infusion (not administered)  acetaminophen (TYLENOL) tablet 650 mg (not administered)    Or  acetaminophen (TYLENOL) suppository 650 mg (not administered)  insulin aspart (novoLOG) injection 0-9 Units (not administered)  sodium chloride 0.9 % bolus 1,000 mL (0 mLs Intravenous Stopped 10/22/16 1130)    And  sodium chloride 0.9 % bolus 1,000 mL (0 mLs Intravenous Stopped 10/22/16 1130)  piperacillin-tazobactam (ZOSYN) IVPB 3.375 g (0 g Intravenous Stopped 10/22/16 1105)  vancomycin (VANCOCIN) 1,250 mg in sodium chloride 0.9 % 250 mL IVPB (0 mg Intravenous Stopped 10/22/16 1235)     Initial Impression / Assessment and Plan / ED Course  I have reviewed the triage vital signs and the nursing notes.  Pertinent labs & imaging results that were available during my care of the patient were reviewed by me and considered in my medical decision making (see chart for details).  Clinical Course as of Oct 23 1598  Mon Oct 22, 2016  1207 BP is improving with fluids. Lactic acid level not elevated  [JK]  1353 Patient's blood pressure continues to improve. Now in the 150s systolic. His urinalysis does suggest urinary tract infection. I suspect that is  the source of his hypotension and fevers. I'll consult with medical service for admission.  [JK]    Clinical Course User Index [JK] Linwood Dibbles, MD   Patient presented to the emergency room with hypotension and fever at his nursing facility. Laboratory tests show an elevated white blood cell count. He was treated with IV fluids and appeared About X. Urinalysis Does Suggest a Urinary Tract Infection.  His blood pressure improved and his lactic acid level is normal. I doubt severe sepsis.  Plan on admission to hospital for further treatment  Final Clinical Impressions(s) / ED Diagnoses   Final diagnoses:  Acute cystitis without hematuria  Hypotension, unspecified hypotension type    New Prescriptions New Prescriptions   No medications on file     Linwood Dibbles, MD 10/22/16 1601

## 2016-10-22 NOTE — ED Notes (Signed)
Dinner tray ordered.

## 2016-10-22 NOTE — H&P (Signed)
[facility] History and Physical  [name] [name] MLY:[id number] DOB: [birth year] DOA: 10/22/2016  Referring physician: Dorie Rank, MD PCP: Trinidad Curet   Chief Complaint: per residential facility, decreased appetite and weakness  HPI: [name] [name] is a 66 y.o. male with pmh of Down Syndrome, DM2, hypertension, CKD presents from [facility] residential facility with complaints of weakness and poor po intake. Pt unable to give history, per aide at bedside staff noted poor po intake over the weekend and thought pt to be lethargic this am. There was question of fever last night, but aide unsure and no documentation available. Upon EMS arrival to facility, pt's BP 92/56, CBG 132. Pt much more alert upon arrival to ED. Aide states pt is currently at baseline oriented to self only and unhappy being examined. At baseline he is wheelchair bound requiring max assistance for transfer.   ED COURSE Labwork in ED significant for WBC 11.0 (no differential obtained). Hemoglobin 10.9, creatinine 1.66 (baseline 1.1-1.3). Urinalysis + nitrites with 6-30 WBCs. BP has responded well to IVFs now 155/84.   Review of Systems:  As stated in hpi, otherwise negative   Past Medical History:  Diagnosis Date  . Diabetes mellitus without complication ([place])   . Down syndrome     Social History:  reports that he has never smoked. He has never used smokeless tobacco. He reports that he does not drink alcohol. His drug history is not on file.  No Known Allergies  No family history on file.   Prior to Admission medications   Medication Sig Start Date End Date Taking? Authorizing Provider  albuterol (PROVENTIL HFA;VENTOLIN HFA) 108 (90 Base) MCG/ACT inhaler Inhale 2 puffs into the lungs every 6 (six) hours as needed for wheezing or shortness of breath (bronchospasm).   Yes Historical Provider, MD  albuterol (PROVENTIL) (2.5 MG/3ML) 0.083% nebulizer solution Take 2.5 mg by nebulization 2 (two) times daily.    Yes  Historical Provider, MD  allopurinol (ZYLOPRIM) 100 MG tablet Take 100 mg by mouth at bedtime.  04/20/13  Yes Historical Provider, MD  amLODipine (NORVASC) 2.5 MG tablet Take 2.5 mg by mouth every evening.    Yes Historical Provider, MD  aspirin 81 MG tablet Take 81 mg by mouth daily.   Yes Historical Provider, MD  Calcium Carbonate-Vitamin D (CALCIUM + D PO) Take 1 tablet by mouth daily.    Yes Historical Provider, MD  clindamycin (CLINDAGEL) 1 % gel Apply 1 application topically as needed (for irritation).  11/17/14  Yes Historical Provider, MD  [employer] MELATONIN 3 MG TABS Take 3 mg by mouth at bedtime.  09/08/14  Yes Historical Provider, MD  fluticasone (FLONASE) 50 MCG/ACT nasal spray Place 2 sprays into both nostrils as needed for allergies.  09/28/14  Yes Historical Provider, MD  lactulose (CHRONULAC) 10 GM/15ML solution Take 6 g by mouth daily. Per MD 59m 02/26/13  Yes Historical Provider, MD  levothyroxine (SYNTHROID, LEVOTHROID) 25 MCG tablet Take 25 mcg by mouth daily before breakfast.   Yes Historical Provider, MD  Memantine HCl-Donepezil HCl (NAMZARIC) 28-10 MG CP24 Take 1 capsule by mouth daily.    Yes Historical Provider, MD  mineral oil-hydrophilic petrolatum (AQUAPHOR) ointment Apply 1 application topically 2 (two) times daily.   Yes Historical Provider, MD  oxybutynin (DITROPAN) 5 MG tablet Take 2.5 mg by mouth daily.  03/27/14  Yes Historical Provider, MD  pravastatin (PRAVACHOL) 20 MG tablet Take 20 mg by mouth daily.  04/20/13  Yes Historical Provider, MD  ranitidine (ZANTAC) 150 MG tablet Take 150 mg by mouth 2 (two) times daily.  10/19/14  Yes Historical Provider, MD  tamsulosin (FLOMAX) 0.4 MG CAPS capsule Take 0.4 mg by mouth daily.  04/20/13  Yes Historical Provider, MD  Control Gel Formula Dressing (DUODERM CGF BORDER EX) Apply 3 each topically as needed.    Historical Provider, MD  ibuprofen (ADVIL,MOTRIN) 400 MG tablet Take 1 tablet (400 mg total) by mouth every 8 (eight) hours as  needed. Patient not taking: Reported on 10/05/2016 07/12/14   Baron Sane, PA-C  loperamide (IMODIUM) 2 MG capsule Take 1 capsule (2 mg total) by mouth 4 (four) times daily as needed for diarrhea or loose stools. Patient not taking: Reported on 10/05/2016 01/01/15   Orpah Greek, MD  metroNIDAZOLE (FLAGYL) 500 MG tablet Take 1 tablet (500 mg total) by mouth 3 (three) times daily. Patient not taking: Reported on 10/05/2016 01/01/15   Orpah Greek, MD   Physical Exam: Vitals:   10/22/16 1130 10/22/16 1145 10/22/16 1205 10/22/16 1339  BP: (!) 117/54 100/72 113/97 155/84  Pulse: 68 64 74 85  Resp: 20 18    Temp:      TempSrc:      SpO2: 96% 97% 97% 96%  Weight:        Wt Readings from Last 3 Encounters:  10/22/16 59 kg (130 lb)  10/31/15 59 kg (130 lb)  10/06/15 59.2 kg (130 lb 9.6 oz)    General: awake, alert seemingly in NAD Eyes: PERRL, EOMI, normal lids, iris ENT: mucous membranes are dry. No lesions noted Neck: no LAD, masses or thyromegaly Cardiovascular: RRR, no m/r/g. No LE edema.  Respiratory: poor inspiratory effort, but CTA bilaterally, no w/r/r.  Abdomen: soft, ntnd, BS+, seemingly no CVA tenderness Skin: no rash or induration seen on limited exam (pt fighting being turned) Musculoskeletal: contracted bilateral upper extremities Psychiatric: alert, oriented to name at baseline per aide Neurologic: CN 2-12 grossly intact          Labs on Admission:  Basic Metabolic Panel:  Recent Labs Lab 10/22/16 1012  NA 145  K 4.0  CL 109  CO2 29  GLUCOSE 115*  BUN 35*  CREATININE 1.66*  CALCIUM 9.1   Liver Function Tests:  Recent Labs Lab 10/22/16 1012  AST 20  ALT 15*  ALKPHOS 63  BILITOT 0.7  PROT 6.1*  ALBUMIN 2.8*   No results for input(s): LIPASE, AMYLASE in the last 168 hours. No results for input(s): AMMONIA in the last 168 hours. CBC:  Recent Labs Lab 10/22/16 1012  WBC 11.0*  HGB 10.9*  HCT 34.0*  MCV 96.0  PLT 175    Cardiac Enzymes: No results for input(s): CKTOTAL, CKMB, CKMBINDEX, TROPONINI in the last 168 hours.  BNP (last 3 results) No results for input(s): BNP in the last 8760 hours.  ProBNP (last 3 results) No results for input(s): PROBNP in the last 8760 hours.   Creatinine clearance cannot be calculated (Unknown ideal weight.)  CBG:  Recent Labs Lab 10/22/16 1132  GLUCAP 98    Radiological Exams on Admission: Dg Chest Portable 1 View  Result Date: 10/22/2016 CLINICAL DATA:  Altered mental status. Lethargy this morning. History of diabetes and Downs syndrome. EXAM: PORTABLE CHEST 1 VIEW COMPARISON:  01/01/2015 and 12/29/2014 radiographs FINDINGS: The heart size and mediastinal contours are stable. There is mild atelectasis at both lung bases associated with suboptimal inspiration. No edema, confluent airspace opacity, pleural effusion or pneumothorax. No  acute osseous findings are seen. There are glenohumeral degenerative changes bilaterally with possible posttraumatic deformity of the right humeral neck. Cholecystectomy clips are noted. IMPRESSION: Suboptimal inspiration and mild bibasilar atelectasis. No focal airspace disease. Electronically Signed   By: Richardean Sale M.D.   On: 10/22/2016 10:28    EKG: Independently reviewed. NSR at 58bpm, no acute t wave abnormalities when compared 03/2016  Assessment/Plan Active Problems:   Type 2 diabetes mellitus, controlled ([place])   DOWNS SYNDROME   Urinary tract infection   CKD (chronic kidney disease)   Anemia   Sepsis secondary to UTI -as evidenced by temp 102.2 and report of sbp 89 in ED -empiric Vancomycin and Zosyn and follow up on cultures -monitor WBC trend  Acute kidney injury/CKD in setting of dehydration -appears volume depleted on exam a/w recent poor oral intake -IVF hydration, avoid renal toxic agent -Creatinine 1.66 on admit (baseline appears to be 1.1-1.3) -consider renal u/s if no improvement  Anemia, chronic  disease -stable. Monitor  Hx hypertension -BP a bit soft on admit -hold Norvasc for now. Monitor  Hx DM2 -per Dr Ronnie Derby last note pt on Actos + met, but do not see on home med list -A1C 1/18 5.5% -SSI while inpt  Hx hypothyroidism -continue synthroid TSH WNL 1/18  Down Syndrome/?early dementia -pt requires max assist for all ADLs. Pureed diet. -d/c back to residential facility when medically stable.    Code Status: full code  DVT Prophylaxis: SQ Lovenox Family Communication: pt's aide at bedside on admit. Paperwork states his guardian is DSS  Disposition Plan: Pending Improvement    E[place], Lynnell Jude, NP [phone] [facility] [email or web address] Password [email or web address]

## 2016-10-23 DIAGNOSIS — E039 Hypothyroidism, unspecified: Secondary | ICD-10-CM | POA: Diagnosis present

## 2016-10-23 DIAGNOSIS — D638 Anemia in other chronic diseases classified elsewhere: Secondary | ICD-10-CM | POA: Diagnosis present

## 2016-10-23 DIAGNOSIS — I129 Hypertensive chronic kidney disease with stage 1 through stage 4 chronic kidney disease, or unspecified chronic kidney disease: Secondary | ICD-10-CM | POA: Diagnosis present

## 2016-10-23 DIAGNOSIS — N3000 Acute cystitis without hematuria: Secondary | ICD-10-CM | POA: Diagnosis present

## 2016-10-23 DIAGNOSIS — E785 Hyperlipidemia, unspecified: Secondary | ICD-10-CM | POA: Diagnosis present

## 2016-10-23 DIAGNOSIS — E1122 Type 2 diabetes mellitus with diabetic chronic kidney disease: Secondary | ICD-10-CM | POA: Diagnosis not present

## 2016-10-23 DIAGNOSIS — R4182 Altered mental status, unspecified: Secondary | ICD-10-CM | POA: Diagnosis present

## 2016-10-23 DIAGNOSIS — Z79899 Other long term (current) drug therapy: Secondary | ICD-10-CM | POA: Diagnosis not present

## 2016-10-23 DIAGNOSIS — Q909 Down syndrome, unspecified: Secondary | ICD-10-CM | POA: Diagnosis not present

## 2016-10-23 DIAGNOSIS — G9341 Metabolic encephalopathy: Secondary | ICD-10-CM | POA: Diagnosis present

## 2016-10-23 DIAGNOSIS — N183 Chronic kidney disease, stage 3 (moderate): Secondary | ICD-10-CM | POA: Diagnosis present

## 2016-10-23 DIAGNOSIS — N179 Acute kidney failure, unspecified: Secondary | ICD-10-CM | POA: Diagnosis present

## 2016-10-23 DIAGNOSIS — Z7951 Long term (current) use of inhaled steroids: Secondary | ICD-10-CM | POA: Diagnosis not present

## 2016-10-23 DIAGNOSIS — Z993 Dependence on wheelchair: Secondary | ICD-10-CM | POA: Diagnosis not present

## 2016-10-23 DIAGNOSIS — Z7982 Long term (current) use of aspirin: Secondary | ICD-10-CM | POA: Diagnosis not present

## 2016-10-23 DIAGNOSIS — A4151 Sepsis due to Escherichia coli [E. coli]: Secondary | ICD-10-CM | POA: Diagnosis present

## 2016-10-23 DIAGNOSIS — N390 Urinary tract infection, site not specified: Secondary | ICD-10-CM | POA: Diagnosis present

## 2016-10-23 DIAGNOSIS — E860 Dehydration: Secondary | ICD-10-CM | POA: Diagnosis present

## 2016-10-23 DIAGNOSIS — I959 Hypotension, unspecified: Secondary | ICD-10-CM | POA: Diagnosis not present

## 2016-10-23 DIAGNOSIS — R7881 Bacteremia: Secondary | ICD-10-CM | POA: Diagnosis not present

## 2016-10-23 LAB — BASIC METABOLIC PANEL
Anion gap: 8 (ref 5–15)
BUN: 25 mg/dL — ABNORMAL HIGH (ref 6–20)
CALCIUM: 8.4 mg/dL — AB (ref 8.9–10.3)
CO2: 26 mmol/L (ref 22–32)
CREATININE: 1.26 mg/dL — AB (ref 0.61–1.24)
Chloride: 113 mmol/L — ABNORMAL HIGH (ref 101–111)
GFR calc Af Amer: >60 mL/min (ref >60)
GFR, EST NON AFRICAN AMERICAN: 59 mL/min — AB (ref >60)
Glucose, Bld: 130 mg/dL — ABNORMAL HIGH (ref 65–99)
Potassium: 3.4 mmol/L — ABNORMAL LOW (ref 3.5–5.1)
SODIUM: 147 mmol/L — AB (ref 135–145)

## 2016-10-23 LAB — CBC
HCT: 32.3 % — ABNORMAL LOW (ref 39.0–52.0)
Hemoglobin: 10.4 g/dL — ABNORMAL LOW (ref 13.0–17.0)
MCH: 31 pg (ref 26.0–34.0)
MCHC: 32.2 g/dL (ref 30.0–36.0)
MCV: 96.4 fL (ref 78.0–100.0)
PLATELETS: 181 10*3/uL (ref 150–400)
RBC: 3.35 MIL/uL — ABNORMAL LOW (ref 4.22–5.81)
RDW: 16.7 % — ABNORMAL HIGH (ref 11.5–15.5)
WBC: 8.9 10*3/uL (ref 4.0–10.5)

## 2016-10-23 LAB — GLUCOSE, CAPILLARY
GLUCOSE-CAPILLARY: 111 mg/dL — AB (ref 65–99)
GLUCOSE-CAPILLARY: 111 mg/dL — AB (ref 65–99)
GLUCOSE-CAPILLARY: 133 mg/dL — AB (ref 65–99)
Glucose-Capillary: 133 mg/dL — ABNORMAL HIGH (ref 65–99)
Glucose-Capillary: 95 mg/dL (ref 65–99)
Glucose-Capillary: 139 mg/dL — ABNORMAL HIGH (ref 65–99)

## 2016-10-23 LAB — MRSA PCR SCREENING: MRSA BY PCR: POSITIVE — AB

## 2016-10-23 MED ORDER — DEXTROSE 5 % IV SOLN
2.0000 g | Freq: Every day | INTRAVENOUS | Status: DC
  Administered 2016-10-23 (×2): 2 g via INTRAVENOUS
  Filled 2016-10-23 (×3): qty 2

## 2016-10-23 NOTE — Progress Notes (Signed)
New Admission Note:  Arrival Method: On stretcher from Ed.  Mental Orientation: Alert and Oriented X1 Telemetry: On. CCMD verified.  Assessment: Completed Skin: MOD on sacral. Iv: Left FA Pain: 0/10 Tubes: None Safety Measures: Safety Fall Prevention Plan discussed with patient. Admission: Completed [address] Orientation: Patient has been orientated to the room, unit and the staff.  Orders have been reviewed and implemented. Will continue to monitor the patient. Call light has been placed within reach and bed alarm has been activated.   Aram CandelaJequetta Wadell Craddock, RN  Phone Number: [phone]

## 2016-10-23 NOTE — Progress Notes (Signed)
Pharmacy Antibiotic Note  [name] LiasJohn W [name] is a 66 y.o. male admitted on 10/22/2016 with sepsis, found to have E. Coli bacteremia.  Pharmacy has been consulted for Rocephin dosing.   Plan: Rocephin 2 g IV q24h   Weight: 130 lb 1.1 oz (59 kg)  Temp (24hrs), Avg:99.7 F (37.6 C), Min:98 F (36.7 C), Max:102.5 F (39.2 C)   Recent Labs Lab 10/22/16 1012 10/22/16 1047 10/22/16 1750 10/22/16 1902 10/22/16 2040  WBC 11.0*  --  12.5*  --   --   CREATININE 1.66*  --  1.43*  --   --   LATICACIDVEN  --  0.99  --  1.1 1.1    CrCl cannot be calculated (Unknown ideal weight.).    No Known Allergies  Geannie RisenGreg Verdie [name], PharmD, BCPS  10/23/2016 12:40 AM

## 2016-10-23 NOTE — Progress Notes (Signed)
Place pt on contact precautions.

## 2016-10-23 NOTE — Care Management Obs Status (Signed)
MEDICARE OBSERVATION STATUS NOTIFICATION   Patient Details  Name: [name] [name] MRN: [id number] Date of Birth: 04/05/1951   Medicare Observation Status Notification Given:  Yes Pt unable to comprehend OBS status or sign, no family or caregiver available.    Ethelyne Erich, Annamarie Majorheryl U, RN 10/23/2016, 1:38 PM

## 2016-10-23 NOTE — Progress Notes (Signed)
PHARMACY - PHYSICIAN COMMUNICATION CRITICAL VALUE ALERT - BLOOD CULTURE IDENTIFICATION (BCID)  Results for orders placed or performed during the hospital encounter of 10/22/16  Blood Culture ID Panel (Reflexed) (Collected: 10/22/2016 10:15 AM)  Result Value Ref Range   Enterococcus species NOT DETECTED NOT DETECTED   Listeria monocytogenes NOT DETECTED NOT DETECTED   Staphylococcus species NOT DETECTED NOT DETECTED   Staphylococcus aureus NOT DETECTED NOT DETECTED   Streptococcus species NOT DETECTED NOT DETECTED   Streptococcus agalactiae NOT DETECTED NOT DETECTED   Streptococcus pneumoniae NOT DETECTED NOT DETECTED   Streptococcus pyogenes NOT DETECTED NOT DETECTED   Acinetobacter baumannii NOT DETECTED NOT DETECTED   Enterobacteriaceae species DETECTED (A) NOT DETECTED   Enterobacter cloacae complex NOT DETECTED NOT DETECTED   Escherichia coli DETECTED (A) NOT DETECTED   Klebsiella oxytoca NOT DETECTED NOT DETECTED   Klebsiella pneumoniae NOT DETECTED NOT DETECTED   Proteus species NOT DETECTED NOT DETECTED   Serratia marcescens NOT DETECTED NOT DETECTED   Carbapenem resistance NOT DETECTED NOT DETECTED   Haemophilus influenzae NOT DETECTED NOT DETECTED   Neisseria meningitidis NOT DETECTED NOT DETECTED   Pseudomonas aeruginosa NOT DETECTED NOT DETECTED   Candida albicans NOT DETECTED NOT DETECTED   Candida glabrata NOT DETECTED NOT DETECTED   Candida krusei NOT DETECTED NOT DETECTED   Candida parapsilosis NOT DETECTED NOT DETECTED   Candida tropicalis NOT DETECTED NOT DETECTED    Name of physician (or Provider) Contacted: Donnamarie PoagK Kirby ([facility])  Changes to prescribed antibiotics required:  Change vancomycin/zosyn to Ceftriaxone  Abran DukeLedford, Galvin Aversa 10/23/2016  12:46 AM

## 2016-10-23 NOTE — Progress Notes (Signed)
PROGRESS NOTE    [name] [name]  ZOX:[id number] DOB: Oct 20, 1950 DOA: 10/22/2016 PCP: Lucretia Field    Brief Narrative: [name] [name] is a 66 y.o. male with pmh of Down Syndrome, DM2, hypertension, CKD presents from [facility] residential facility with complaints of weakness and poor po intake, fever and chills. On arrival to ED he was found to have prfound UTI, was started on broad spectrum antibiotics and narrowed it down to IV rocephin as his blood cultures revealed E COLI.   Assessment & Plan:   Active Problems:   Type 2 diabetes mellitus, controlled (HCC)   DOWNS SYNDROME   Urinary tract infection   CKD (chronic kidney disease)   Anemia   E COLI bacteremia:  On IV rocephin, sensitivities are pending, probably from UTI.  Urine cultures are pending,  Monitor WBC count, afebrile today.  Lactic acid is normal.  procalcitonin is 15.5   Acute renal failure : probably secondary to dehydration and decreased po intake.  Gentle hydration. Repeat creatinine is 1.4 improved since admission.   Down's syndrome : at baseline, no agitation. Resume aricept and namenda.    Hypothyroidism:  Resume synthroid. TSH pending.   Hyperlipidemia: resume pravalchol.   Diabetes mellitus:  Diet controlled.  CBG (last 3)   Recent Labs  10/22/16 2356 10/23/16 0802 10/23/16 1127  GLUCAP 111* 95 111*    Resume SSI. hgba1c pending.     DVT prophylaxis: lovenox.  Code Status: (Ful Family Communication: pt of group home. No family at bedside.  Disposition Plan: back to group home in 1 to 2 days.    Consultants:   None.    Procedures: none.    Antimicrobials: IV rocephin. 3/6   Subjective: No new complaints.  Pleasant, an the group home nurse at bedside.   Objective: Vitals:   10/22/16 2210 10/23/16 0447 10/23/16 0900 10/23/16 0925  BP: (!) 104/58 106/60  (!) 94/57  Pulse: 63 64  62  Resp: 16 16  14   Temp: 98.5 F (36.9 C) 98.6 F (37 C)  98.9 F (37.2 C)  TempSrc: Oral  Oral  Oral  SpO2: 97% 97%  96%  Weight:      Height:   5' 1.02" (1.55 m)     Intake/Output Summary (Last 24 hours) at 10/23/16 1123 Last data filed at 10/23/16 0925  Gross per 24 hour  Intake          2673.33 ml  Output                0 ml  Net          2673.33 ml   Filed Weights   10/22/16 0943 10/22/16 2105  Weight: 59 kg (130 lb) 59 kg (130 lb 1.1 oz)    Examination:  General exam: Appears calm and comfortable  Respiratory system: Clear to auscultation. Respiratory effort normal. Cardiovascular system: S1 & S2 heard, RRR. No JVD, murmurs, rubs, gallops or clicks. No pedal edema. Gastrointestinal system: Abdomen is nondistended, soft and nontender. No organomegaly or masses felt. Normal bowel sounds heard. Central nervous system: confused, non focal. . Extremities: Symmetric 5 x 5 power.     Data Reviewed: I have personally reviewed following labs and imaging studies  CBC:  Recent Labs Lab 10/22/16 1012 10/22/16 1750  WBC 11.0* 12.5*  HGB 10.9* 11.1*  HCT 34.0* 33.6*  MCV 96.0 94.9  PLT 175 166   Basic Metabolic Panel:  Recent Labs Lab 10/22/16 1012 10/22/16 1750  NA 145  --   K 4.0  --   CL 109  --   CO2 29  --   GLUCOSE 115*  --   BUN 35*  --   CREATININE 1.66* 1.43*  CALCIUM 9.1  --    GFR: Estimated Creatinine Clearance: 39.2 mL/min (by C-G formula based on SCr of 1.43 mg/dL (H)). Liver Function Tests:  Recent Labs Lab 10/22/16 1012 10/22/16 1750  AST 20 24  ALT 15* 18  ALKPHOS 63 82  BILITOT 0.7 1.0  PROT 6.1* 6.2*  ALBUMIN 2.8* 2.7*   No results for input(s): LIPASE, AMYLASE in the last 168 hours. No results for input(s): AMMONIA in the last 168 hours. Coagulation Profile:  Recent Labs Lab 10/22/16 1012  INR 1.28   Cardiac Enzymes: No results for input(s): CKTOTAL, CKMB, CKMBINDEX, TROPONINI in the last 168 hours. BNP (last 3 results) No results for input(s): PROBNP in the last 8760 hours. HbA1C: No results for input(s):  HGBA1C in the last 72 hours. CBG:  Recent Labs Lab 10/22/16 1132 10/22/16 1904 10/22/16 2253 10/22/16 2356 10/23/16 0802  GLUCAP 98 123* 133* 111* 95   Lipid Profile: No results for input(s): CHOL, HDL, LDLCALC, TRIG, CHOLHDL, LDLDIRECT in the last 72 hours. Thyroid Function Tests: No results for input(s): TSH, T4TOTAL, FREET4, T3FREE, THYROIDAB in the last 72 hours. Anemia Panel: No results for input(s): VITAMINB12, FOLATE, FERRITIN, TIBC, IRON, RETICCTPCT in the last 72 hours. Sepsis Labs:  Recent Labs Lab 10/22/16 1047 10/22/16 1902 10/22/16 2040  PROCALCITON  --  15.52  --   LATICACIDVEN 0.99 1.1 1.1    Recent Results (from the past 240 hour(s))  Culture, blood (Routine x 2)     Status: None (Preliminary result)   Collection Time: 10/22/16 10:15 AM  Result Value Ref Range Status   Specimen Description BLOOD RIGHT ANTECUBITAL  Final   Special Requests BOTTLES DRAWN AEROBIC AND ANAEROBIC  10CC  Final   Culture  Setup Time   Final    GRAM NEGATIVE RODS IN BOTH AEROBIC AND ANAEROBIC BOTTLES Organism ID to follow CRITICAL RESULT CALLED TO, READ BACK BY AND VERIFIED WITH: JAMES LEDFORD,PHARMD @2352  10/22/16 MKELLY,MLT    Culture PENDING  Incomplete   Report Status PENDING  Incomplete  Blood Culture ID Panel (Reflexed)     Status: Abnormal   Collection Time: 10/22/16 10:15 AM  Result Value Ref Range Status   Enterococcus species NOT DETECTED NOT DETECTED Final   Listeria monocytogenes NOT DETECTED NOT DETECTED Final   Staphylococcus species NOT DETECTED NOT DETECTED Final   Staphylococcus aureus NOT DETECTED NOT DETECTED Final   Streptococcus species NOT DETECTED NOT DETECTED Final   Streptococcus agalactiae NOT DETECTED NOT DETECTED Final   Streptococcus pneumoniae NOT DETECTED NOT DETECTED Final   Streptococcus pyogenes NOT DETECTED NOT DETECTED Final   Acinetobacter baumannii NOT DETECTED NOT DETECTED Final   Enterobacteriaceae species DETECTED (A) NOT DETECTED  Final    Comment: Enterobacteriaceae represent a large family of gram-negative bacteria, not a single organism. CRITICAL RESULT CALLED TO, READ BACK BY AND VERIFIED WITH: JAMES LEDFORD,PHARMD @2352  10/22/16 MKELLY,MLT    Enterobacter cloacae complex NOT DETECTED NOT DETECTED Final   Escherichia coli DETECTED (A) NOT DETECTED Final    Comment: CRITICAL RESULT CALLED TO, READ BACK BY AND VERIFIED WITH: JAMES LEDFORD,PHARMD @2352  10/22/16 MKELLY,MLT    Klebsiella oxytoca NOT DETECTED NOT DETECTED Final   Klebsiella pneumoniae NOT DETECTED NOT DETECTED Final   Proteus species NOT DETECTED  NOT DETECTED Final   Serratia marcescens NOT DETECTED NOT DETECTED Final   Carbapenem resistance NOT DETECTED NOT DETECTED Final   Haemophilus influenzae NOT DETECTED NOT DETECTED Final   Neisseria meningitidis NOT DETECTED NOT DETECTED Final   Pseudomonas aeruginosa NOT DETECTED NOT DETECTED Final   Candida albicans NOT DETECTED NOT DETECTED Final   Candida glabrata NOT DETECTED NOT DETECTED Final   Candida krusei NOT DETECTED NOT DETECTED Final   Candida parapsilosis NOT DETECTED NOT DETECTED Final   Candida tropicalis NOT DETECTED NOT DETECTED Final  Culture, blood (Routine x 2)     Status: None (Preliminary result)   Collection Time: 10/22/16 10:28 AM  Result Value Ref Range Status   Specimen Description BLOOD BLOOD RIGHT HAND  Final   Special Requests AEROBIC BOTTLE ONLY  10CC  Final   Culture  Setup Time   Final    GRAM NEGATIVE RODS AEROBIC BOTTLE ONLY CRITICAL VALUE NOTED.  VALUE IS CONSISTENT WITH PREVIOUSLY REPORTED AND CALLED VALUE.    Culture PENDING  Incomplete   Report Status PENDING  Incomplete  Urine culture     Status: Abnormal (Preliminary result)   Collection Time: 10/22/16  3:59 PM  Result Value Ref Range Status   Specimen Description URINE, RANDOM  Final   Special Requests NONE  Final   Culture 40,000 COLONIES/mL ESCHERICHIA COLI (A)  Final   Report Status PENDING   Incomplete  MRSA PCR Screening     Status: Abnormal   Collection Time: 10/22/16 11:59 PM  Result Value Ref Range Status   MRSA by PCR POSITIVE (A) NEGATIVE Final    Comment:        The GeneXpert MRSA Assay (FDA approved for NASAL specimens only), is one component of a comprehensive MRSA colonization surveillance program. It is not intended to diagnose MRSA infection nor to guide or monitor treatment for MRSA infections. RESULT CALLED TO, READ BACK BY AND VERIFIED WITH: J WOODY,RN @0258  10/23/16 MKELLY,MLT          Radiology Studies: Dg Chest Portable 1 View  Result Date: 10/22/2016 CLINICAL DATA:  Altered mental status. Lethargy this morning. History of diabetes and Downs syndrome. EXAM: PORTABLE CHEST 1 VIEW COMPARISON:  01/01/2015 and 12/29/2014 radiographs FINDINGS: The heart size and mediastinal contours are stable. There is mild atelectasis at both lung bases associated with suboptimal inspiration. No edema, confluent airspace opacity, pleural effusion or pneumothorax. No acute osseous findings are seen. There are glenohumeral degenerative changes bilaterally with possible posttraumatic deformity of the right humeral neck. Cholecystectomy clips are noted. IMPRESSION: Suboptimal inspiration and mild bibasilar atelectasis. No focal airspace disease. Electronically Signed   By: Carey Bullocks M.D.   On: 10/22/2016 10:28        Scheduled Meds: . albuterol  2.5 mg Nebulization BID  . aspirin EC  81 mg Oral Daily  . cefTRIAXone (ROCEPHIN)  IV  2 g Intravenous QHS  . donepezil  10 mg Oral Daily  . enoxaparin (LOVENOX) injection  40 mg Subcutaneous Q24H  . famotidine  20 mg Oral Daily  . insulin aspart  0-9 Units Subcutaneous TID WC  . levothyroxine  25 mcg Oral QAC breakfast  . memantine  28 mg Oral Daily  . oxybutynin  2.5 mg Oral Daily  . pravastatin  20 mg Oral Daily  . tamsulosin  0.4 mg Oral Daily   Continuous Infusions: . sodium chloride 100 mL/hr at 10/23/16 0908      LOS: 0 days  Time spent: 30 minutes.     Kathlen ModyAKULA,Simar Pothier, MD [facility] Pager [phone]   If 7PM-7AM, please contact night-coverage [email or web address] Password [email or web address] 10/23/2016, 11:23 AM

## 2016-10-23 NOTE — Progress Notes (Signed)
Pt unable to answer questions

## 2016-10-24 DIAGNOSIS — I959 Hypotension, unspecified: Secondary | ICD-10-CM

## 2016-10-24 DIAGNOSIS — G9341 Metabolic encephalopathy: Secondary | ICD-10-CM | POA: Diagnosis present

## 2016-10-24 DIAGNOSIS — E1122 Type 2 diabetes mellitus with diabetic chronic kidney disease: Secondary | ICD-10-CM

## 2016-10-24 DIAGNOSIS — R7881 Bacteremia: Secondary | ICD-10-CM | POA: Diagnosis present

## 2016-10-24 DIAGNOSIS — Q909 Down syndrome, unspecified: Secondary | ICD-10-CM

## 2016-10-24 DIAGNOSIS — B9620 Unspecified Escherichia coli [E. coli] as the cause of diseases classified elsewhere: Secondary | ICD-10-CM | POA: Diagnosis present

## 2016-10-24 DIAGNOSIS — N3000 Acute cystitis without hematuria: Secondary | ICD-10-CM | POA: Diagnosis present

## 2016-10-24 LAB — GLUCOSE, CAPILLARY
Glucose-Capillary: 100 mg/dL — ABNORMAL HIGH (ref 65–99)
Glucose-Capillary: 113 mg/dL — ABNORMAL HIGH (ref 65–99)

## 2016-10-24 LAB — URINE CULTURE

## 2016-10-24 MED ORDER — CHLORHEXIDINE GLUCONATE CLOTH 2 % EX PADS
6.0000 | MEDICATED_PAD | Freq: Every day | CUTANEOUS | Status: DC
  Administered 2016-10-24: 6 via TOPICAL

## 2016-10-24 MED ORDER — MUPIROCIN 2 % EX OINT
1.0000 "application " | TOPICAL_OINTMENT | Freq: Two times a day (BID) | CUTANEOUS | Status: DC
  Administered 2016-10-24: 1 via NASAL

## 2016-10-24 MED ORDER — CIPROFLOXACIN HCL 500 MG PO TABS: 500.0000 mg | ORAL_TABLET | Freq: Two times a day (BID) | ORAL | 0 refills | Status: DC

## 2016-10-24 NOTE — Progress Notes (Signed)
[name] [name] to be D/C'd Boarding Home per MD order.  Discussed prescriptions and follow up appointments with the patient. Prescriptions given to patient, medication list explained in detail. Pt verbalized understanding.  Allergies as of 10/24/2016   No Known Allergies     Medication List    STOP taking these medications   metroNIDAZOLE 500 MG tablet Commonly known as:  FLAGYL     TAKE these medications   albuterol (2.5 MG/3ML) 0.083% nebulizer solution Commonly known as:  PROVENTIL Take 2.5 mg by nebulization 2 (two) times daily.   albuterol 108 (90 Base) MCG/ACT inhaler Commonly known as:  PROVENTIL HFA;VENTOLIN HFA Inhale 2 puffs into the lungs every 6 (six) hours as needed for wheezing or shortness of breath (bronchospasm).   allopurinol 100 MG tablet Commonly known as:  ZYLOPRIM Take 100 mg by mouth at bedtime.   amLODipine 2.5 MG tablet Commonly known as:  NORVASC Take 2.5 mg by mouth every evening.   aspirin 81 MG tablet Take 81 mg by mouth daily.   CALCIUM + D PO Take 1 tablet by mouth daily.   ciprofloxacin 500 MG tablet Commonly known as:  CIPRO Take 1 tablet (500 mg total) by mouth 2 (two) times daily.   clindamycin 1 % gel Commonly known as:  CLINDAGEL Apply 1 application topically as needed (for irritation).   [facility] MELATONIN 3 MG Tabs Generic drug:  Melatonin Take 3 mg by mouth at bedtime.   DUODERM CGF BORDER EX Apply 3 each topically as needed.   fluticasone 50 MCG/ACT nasal spray Commonly known as:  FLONASE Place 2 sprays into both nostrils as needed for allergies.   ibuprofen 400 MG tablet Commonly known as:  ADVIL,MOTRIN Take 1 tablet (400 mg total) by mouth every 8 (eight) hours as needed.   lactulose 10 GM/15ML solution Commonly known as:  CHRONULAC Take 6 g by mouth daily. Per MD 10mL   levothyroxine 25 MCG tablet Commonly known as:  SYNTHROID, LEVOTHROID Take 25 mcg by mouth daily before breakfast.   loperamide 2 MG  capsule Commonly known as:  IMODIUM Take 1 capsule (2 mg total) by mouth 4 (four) times daily as needed for diarrhea or loose stools.   mineral oil-hydrophilic petrolatum ointment Apply 1 application topically 2 (two) times daily.   NAMZARIC 28-10 MG Cp24 Generic drug:  Memantine HCl-Donepezil HCl Take 1 capsule by mouth daily.   oxybutynin 5 MG tablet Commonly known as:  DITROPAN Take 2.5 mg by mouth daily.   pravastatin 20 MG tablet Commonly known as:  PRAVACHOL Take 20 mg by mouth daily.   ranitidine 150 MG tablet Commonly known as:  ZANTAC Take 150 mg by mouth 2 (two) times daily.   tamsulosin 0.4 MG Caps capsule Commonly known as:  FLOMAX Take 0.4 mg by mouth daily.       Vitals:   10/24/16 0606 10/24/16 1014  BP: 116/78 (!) 102/51  Pulse: 69 69  Resp: 15 16  Temp: 98 F (36.7 C) 100.3 F (37.9 C)    Skin clean, dry and intact without evidence of skin break down, no evidence of skin tears noted. IV catheter discontinued intact. Site without signs and symptoms of complications. Dressing and pressure applied. Pt denies pain at this time. No complaints noted.  An After Visit Summary was printed and given to the patient. Patient escorted via WC, and D/C home via private auto.  [name] ForehandLuke Bina [name] BSN, RN

## 2016-10-24 NOTE — Discharge Summary (Addendum)
Physician Discharge Summary  [name] [name]:[id number] DOB: Jun 29, 1951 DOA: 10/22/2016  PCP: Lucretia Field  Admit date: 10/22/2016 Discharge date: 10/24/2016  Admitted From: [facility] residential facility Disposition: [facility] residential facility   Recommendations for Outpatient Follow-up:  1. Follow up with PCP in 1-2 weeks 2. Please obtain BMP/CBC in one week to monitor renal function and leukocytosis. 3. Please follow up on the following pending results: Final blood culture reports.  Home Health: None Equipment/Devices: None Discharge Condition: Stable CODE STATUS: Full, presumed (ward of the state) Diet recommendation: Carbohydrate modified  Brief/Interim Summary: [name] [name] is a 66 y.o. male with pmh of Down syndrome, T2DM, HTN and CKD who presented from [facility] residential facility with complaints of weakness and poor po intake. Pt unable to give history, per aide at bedside staff noted poor po intake over the weekend and thought pt to be lethargic PTA. Upon EMS arrival to facility, pt's BP 92/56, CBG 132. Pt much more alert upon arrival to ED. Aide states pt was currently at baseline oriented to self only and unhappy being examined. At baseline he is wheelchair bound requiring max assistance for transfer. Labwork in ED significant for WBC 11.0 (no differential obtained). Hemoglobin 10.9, creatinine 1.66 (baseline 1.1-1.3). Urinalysis + nitrites with 6-30 WBCs. BP responded well to IVFs. Vancomycin and zosyn were started, narrowed to ceftriaxone based on urine culture sensitivities of E. coli. Isolates were pan-sensitive. Blood cultures grew GNR's which speciated to E. coli as well. Creatinine has declined into baseline range (last 1.26) and the patient ate a full lunch on 3/7 without issues. He is discharged on ciprofloxacin to complete 2 weeks of therapy.  Discharge Diagnoses:  Active Problems:   Type 2 diabetes mellitus, controlled (HCC)   DOWNS SYNDROME   Urinary tract  infection   CKD (chronic kidney disease) stage 3, GFR 30-59 ml/min   Anemia   UTI (urinary tract infection)   E coli bacteremia   Acute cystitis without hematuria  E COLI bacteremia: Narrowed vanc/zosyn > ceftriaxone > ciprofloxacin for 14 total days of therapy based on urine culture sensitivities. WBC normalized. Lactate normal. Afebrile.  - Will monitor blood culture results, final reports pending at discharge.  - The patient had metabolic encephalopathy secondary to bacteremia  Acute renal failure on CKD stage III: probably secondary to dehydration and decreased po intake. Improved with gentle hydration.  Down's syndrome : at baseline, no agitation. Resume aricept and namenda.   Hypothyroidism: Resume synthroid. TSH 4.7 in Jan 2018.   Hyperlipidemia: Resume pravalchol.   Diabetes mellitus: Diet controlled. HbA1c 5.5% in Jan 2018.  Discharge Instructions Discharge Instructions    Discharge instructions    Complete by:  As directed    You were admitted with bacteria in your blood that likely came from a urinary tract infection (UTI). This has improved with treatment and based on the culture information, you are being prescribed ciprofloxacin, to be taken twice daily for the next 12 days. If your symptoms return, please seek medical attention right away. Otherwise, follow up with your primary care physician in the next 1 - 2 weeks.     Allergies as of 10/24/2016   No Known Allergies     Medication List    STOP taking these medications   metroNIDAZOLE 500 MG tablet Commonly known as:  FLAGYL     TAKE these medications   albuterol (2.5 MG/3ML) 0.083% nebulizer solution Commonly known as:  PROVENTIL Take 2.5 mg by nebulization 2 (two) times  daily.   albuterol 108 (90 Base) MCG/ACT inhaler Commonly known as:  PROVENTIL HFA;VENTOLIN HFA Inhale 2 puffs into the lungs every 6 (six) hours as needed for wheezing or shortness of breath (bronchospasm).   allopurinol 100 MG  tablet Commonly known as:  ZYLOPRIM Take 100 mg by mouth at bedtime.   amLODipine 2.5 MG tablet Commonly known as:  NORVASC Take 2.5 mg by mouth every evening.   aspirin 81 MG tablet Take 81 mg by mouth daily.   CALCIUM + D PO Take 1 tablet by mouth daily.   ciprofloxacin 500 MG tablet Commonly known as:  CIPRO Take 1 tablet (500 mg total) by mouth 2 (two) times daily.   clindamycin 1 % gel Commonly known as:  CLINDAGEL Apply 1 application topically as needed (for irritation).   [employer] MELATONIN 3 MG Tabs Generic drug:  Melatonin Take 3 mg by mouth at bedtime.   DUODERM CGF BORDER EX Apply 3 each topically as needed.   fluticasone 50 MCG/ACT nasal spray Commonly known as:  FLONASE Place 2 sprays into both nostrils as needed for allergies.   ibuprofen 400 MG tablet Commonly known as:  ADVIL,MOTRIN Take 1 tablet (400 mg total) by mouth every 8 (eight) hours as needed.   lactulose 10 GM/15ML solution Commonly known as:  CHRONULAC Take 6 g by mouth daily. Per MD 10mL   levothyroxine 25 MCG tablet Commonly known as:  SYNTHROID, LEVOTHROID Take 25 mcg by mouth daily before breakfast.   loperamide 2 MG capsule Commonly known as:  IMODIUM Take 1 capsule (2 mg total) by mouth 4 (four) times daily as needed for diarrhea or loose stools.   mineral oil-hydrophilic petrolatum ointment Apply 1 application topically 2 (two) times daily.   NAMZARIC 28-10 MG Cp24 Generic drug:  Memantine HCl-Donepezil HCl Take 1 capsule by mouth daily.   oxybutynin 5 MG tablet Commonly known as:  DITROPAN Take 2.5 mg by mouth daily.   pravastatin 20 MG tablet Commonly known as:  PRAVACHOL Take 20 mg by mouth daily.   ranitidine 150 MG tablet Commonly known as:  ZANTAC Take 150 mg by mouth 2 (two) times daily.   tamsulosin 0.4 MG Caps capsule Commonly known as:  FLOMAX Take 0.4 mg by mouth daily.      Follow-up Information    Royals, Gretta Began Follow up.   Specialty:  Family  Medicine Contact information: [address] [place] [address] [phone]          No Known Allergies  Consultations:  None  Procedures/Studies: Dg Chest Portable 1 View  Result Date: 10/22/2016 CLINICAL DATA:  Altered mental status. Lethargy this morning. History of diabetes and Downs syndrome. EXAM: PORTABLE CHEST 1 VIEW COMPARISON:  01/01/2015 and 12/29/2014 radiographs FINDINGS: The heart size and mediastinal contours are stable. There is mild atelectasis at both lung bases associated with suboptimal inspiration. No edema, confluent airspace opacity, pleural effusion or pneumothorax. No acute osseous findings are seen. There are glenohumeral degenerative changes bilaterally with possible posttraumatic deformity of the right humeral neck. Cholecystectomy clips are noted. IMPRESSION: Suboptimal inspiration and mild bibasilar atelectasis. No focal airspace disease. Electronically Signed   By: Carey Bullocks M.D.   On: 10/22/2016 10:28   Subjective: Pt without complaints. No abd pain, nausea, vomiting. Ate lunch today.   Discharge Exam: Vitals:   10/24/16 0606 10/24/16 1014  BP: 116/78 (!) 102/51  Pulse: 69 69  Resp: 15 16  Temp: 98 F (36.7 C) 100.3 F (37.9  C)   General: Pt is alert, awake, not in acute distress Cardiovascular: RRR, S1/S2 +, no rubs, no gallops Respiratory: CTA bilaterally, no wheezing, no rhonchi Abdominal: Soft, NT, ND, bowel sounds + Extremities: No edema, no cyanosis  The results of significant diagnostics from this hospitalization (including imaging, microbiology, ancillary and laboratory) are listed below for reference.    Labs: Basic Metabolic Panel:  Recent Labs Lab 10/22/16 1012 10/22/16 1750 10/23/16 1150  NA 145  --  147*  K 4.0  --  3.4*  CL 109  --  113*  CO2 29  --  26  GLUCOSE 115*  --  130*  BUN 35*  --  25*  CREATININE 1.66* 1.43* 1.26*  CALCIUM 9.1  --  8.4*   Liver Function Tests:  Recent Labs Lab  10/22/16 1012 10/22/16 1750  AST 20 24  ALT 15* 18  ALKPHOS 63 82  BILITOT 0.7 1.0  PROT 6.1* 6.2*  ALBUMIN 2.8* 2.7*   CBC:  Recent Labs Lab 10/22/16 1012 10/22/16 1750 10/23/16 1150  WBC 11.0* 12.5* 8.9  HGB 10.9* 11.1* 10.4*  HCT 34.0* 33.6* 32.3*  MCV 96.0 94.9 96.4  PLT 175 166 181   CBG:  Recent Labs Lab 10/23/16 1127 10/23/16 1631 10/23/16 2218 10/24/16 0752 10/24/16 1120  GLUCAP 111* 139* 133* 100* 113*    Urinalysis    Component Value Date/Time   COLORURINE YELLOW 10/22/2016 1228   APPEARANCEUR CLEAR 10/22/2016 1228   LABSPEC 1.009 10/22/2016 1228   PHURINE 6.0 10/22/2016 1228   GLUCOSEU NEGATIVE 10/22/2016 1228   GLUCOSEU NEGATIVE 05/04/2013 1112   HGBUR MODERATE (A) 10/22/2016 1228   BILIRUBINUR NEGATIVE 10/22/2016 1228   KETONESUR NEGATIVE 10/22/2016 1228   PROTEINUR NEGATIVE 10/22/2016 1228   UROBILINOGEN 0.2 01/01/2015 1315   NITRITE POSITIVE (A) 10/22/2016 1228   LEUKOCYTESUR MODERATE (A) 10/22/2016 1228    Microbiology Recent Results (from the past 240 hour(s))  Culture, blood (Routine x 2)     Status: Abnormal (Preliminary result)   Collection Time: 10/22/16 10:15 AM  Result Value Ref Range Status   Specimen Description BLOOD RIGHT ANTECUBITAL  Final   Special Requests BOTTLES DRAWN AEROBIC AND ANAEROBIC  10CC  Final   Culture  Setup Time   Final    GRAM NEGATIVE RODS IN BOTH AEROBIC AND ANAEROBIC BOTTLES Organism ID to follow CRITICAL RESULT CALLED TO, READ BACK BY AND VERIFIED WITH: JAMES LEDFORD,PHARMD @2352  10/22/16 MKELLY,MLT    Culture ESCHERICHIA COLI (A)  Final   Report Status PENDING  Incomplete  Blood Culture ID Panel (Reflexed)     Status: Abnormal   Collection Time: 10/22/16 10:15 AM  Result Value Ref Range Status   Enterococcus species NOT DETECTED NOT DETECTED Final   Listeria monocytogenes NOT DETECTED NOT DETECTED Final   Staphylococcus species NOT DETECTED NOT DETECTED Final   Staphylococcus aureus NOT  DETECTED NOT DETECTED Final   Streptococcus species NOT DETECTED NOT DETECTED Final   Streptococcus agalactiae NOT DETECTED NOT DETECTED Final   Streptococcus pneumoniae NOT DETECTED NOT DETECTED Final   Streptococcus pyogenes NOT DETECTED NOT DETECTED Final   Acinetobacter baumannii NOT DETECTED NOT DETECTED Final   Enterobacteriaceae species DETECTED (A) NOT DETECTED Final    Comment: Enterobacteriaceae represent a large family of gram-negative bacteria, not a single organism. CRITICAL RESULT CALLED TO, READ BACK BY AND VERIFIED WITH: JAMES LEDFORD,PHARMD @2352  10/22/16 MKELLY,MLT    Enterobacter cloacae complex NOT DETECTED NOT DETECTED Final   Escherichia coli DETECTED (  A) NOT DETECTED Final    Comment: CRITICAL RESULT CALLED TO, READ BACK BY AND VERIFIED WITH: JAMES LEDFORD,PHARMD @2352  10/22/16 MKELLY,MLT    Klebsiella oxytoca NOT DETECTED NOT DETECTED Final   Klebsiella pneumoniae NOT DETECTED NOT DETECTED Final   Proteus species NOT DETECTED NOT DETECTED Final   Serratia marcescens NOT DETECTED NOT DETECTED Final   Carbapenem resistance NOT DETECTED NOT DETECTED Final   Haemophilus influenzae NOT DETECTED NOT DETECTED Final   Neisseria meningitidis NOT DETECTED NOT DETECTED Final   Pseudomonas aeruginosa NOT DETECTED NOT DETECTED Final   Candida albicans NOT DETECTED NOT DETECTED Final   Candida glabrata NOT DETECTED NOT DETECTED Final   Candida krusei NOT DETECTED NOT DETECTED Final   Candida parapsilosis NOT DETECTED NOT DETECTED Final   Candida tropicalis NOT DETECTED NOT DETECTED Final  Culture, blood (Routine x 2)     Status: None (Preliminary result)   Collection Time: 10/22/16 10:28 AM  Result Value Ref Range Status   Specimen Description BLOOD BLOOD RIGHT HAND  Final   Special Requests AEROBIC BOTTLE ONLY  10CC  Final   Culture  Setup Time   Final    GRAM NEGATIVE RODS AEROBIC BOTTLE ONLY CRITICAL VALUE NOTED.  VALUE IS CONSISTENT WITH PREVIOUSLY REPORTED AND  CALLED VALUE.    Culture GRAM NEGATIVE RODS  Final   Report Status PENDING  Incomplete  Urine culture     Status: Abnormal   Collection Time: 10/22/16  3:59 PM  Result Value Ref Range Status   Specimen Description URINE, RANDOM  Final   Special Requests NONE  Final   Culture 40,000 COLONIES/mL ESCHERICHIA COLI (A)  Final   Report Status 10/24/2016 FINAL  Final   Organism ID, Bacteria ESCHERICHIA COLI (A)  Final      Susceptibility   Escherichia coli - MIC*    AMPICILLIN 8 SENSITIVE Sensitive     CEFAZOLIN <=4 SENSITIVE Sensitive     CEFTRIAXONE <=1 SENSITIVE Sensitive     CIPROFLOXACIN <=0.25 SENSITIVE Sensitive     GENTAMICIN <=1 SENSITIVE Sensitive     IMIPENEM <=0.25 SENSITIVE Sensitive     NITROFURANTOIN <=16 SENSITIVE Sensitive     TRIMETH/SULFA <=20 SENSITIVE Sensitive     AMPICILLIN/SULBACTAM 4 SENSITIVE Sensitive     PIP/TAZO <=4 SENSITIVE Sensitive     Extended ESBL NEGATIVE Sensitive     * 40,000 COLONIES/mL ESCHERICHIA COLI  MRSA PCR Screening     Status: Abnormal   Collection Time: 10/22/16 11:59 PM  Result Value Ref Range Status   MRSA by PCR POSITIVE (A) NEGATIVE Final    Comment:        The GeneXpert MRSA Assay (FDA approved for NASAL specimens only), is one component of a comprehensive MRSA colonization surveillance program. It is not intended to diagnose MRSA infection nor to guide or monitor treatment for MRSA infections. RESULT CALLED TO, READ BACK BY AND VERIFIED WITH: J WOODY,RN @0258  10/23/16 MKELLY,MLT     Time coordinating discharge: Approximately 40 minutes  Hazeline Junkeryan Islam Eichinger, MD  [facility] 10/24/2016, 5:32 PM Pager [phone]  If 7PM-7AM, please contact night-coverage [email or web address] Password [email or web address]

## 2016-10-25 LAB — CULTURE, BLOOD (ROUTINE X 2)

## 2016-12-20 ENCOUNTER — Emergency Department (HOSPITAL_COMMUNITY): Payer: Medicare Other

## 2016-12-20 ENCOUNTER — Inpatient Hospital Stay (HOSPITAL_COMMUNITY): Payer: Medicare Other

## 2016-12-20 ENCOUNTER — Inpatient Hospital Stay (HOSPITAL_COMMUNITY)
Admission: EM | Admit: 2016-12-20 | Discharge: 2016-12-25 | DRG: 871 | Disposition: A | Discharge status: Home / Self Care | Payer: Medicare Other | Attending: Internal Medicine | Admitting: Internal Medicine

## 2016-12-20 ENCOUNTER — Encounter (HOSPITAL_COMMUNITY): Payer: Self-pay

## 2016-12-20 DIAGNOSIS — N201 Calculus of ureter: Secondary | ICD-10-CM | POA: Diagnosis not present

## 2016-12-20 DIAGNOSIS — Z87442 Personal history of urinary calculi: Secondary | ICD-10-CM | POA: Diagnosis not present

## 2016-12-20 DIAGNOSIS — Z7951 Long term (current) use of inhaled steroids: Secondary | ICD-10-CM

## 2016-12-20 DIAGNOSIS — N390 Urinary tract infection, site not specified: Secondary | ICD-10-CM

## 2016-12-20 DIAGNOSIS — A419 Sepsis, unspecified organism: Secondary | ICD-10-CM | POA: Diagnosis not present

## 2016-12-20 DIAGNOSIS — J45909 Unspecified asthma, uncomplicated: Secondary | ICD-10-CM | POA: Diagnosis present

## 2016-12-20 DIAGNOSIS — E039 Hypothyroidism, unspecified: Secondary | ICD-10-CM | POA: Diagnosis present

## 2016-12-20 DIAGNOSIS — E876 Hypokalemia: Secondary | ICD-10-CM | POA: Diagnosis present

## 2016-12-20 DIAGNOSIS — A4151 Sepsis due to Escherichia coli [E. coli]: Secondary | ICD-10-CM | POA: Diagnosis not present

## 2016-12-20 DIAGNOSIS — F79 Unspecified intellectual disabilities: Secondary | ICD-10-CM | POA: Diagnosis present

## 2016-12-20 DIAGNOSIS — G9341 Metabolic encephalopathy: Secondary | ICD-10-CM | POA: Diagnosis present

## 2016-12-20 DIAGNOSIS — K5900 Constipation, unspecified: Secondary | ICD-10-CM | POA: Diagnosis present

## 2016-12-20 DIAGNOSIS — D649 Anemia, unspecified: Secondary | ICD-10-CM | POA: Diagnosis present

## 2016-12-20 DIAGNOSIS — K219 Gastro-esophageal reflux disease without esophagitis: Secondary | ICD-10-CM | POA: Diagnosis present

## 2016-12-20 DIAGNOSIS — E1122 Type 2 diabetes mellitus with diabetic chronic kidney disease: Secondary | ICD-10-CM | POA: Diagnosis present

## 2016-12-20 DIAGNOSIS — R6520 Severe sepsis without septic shock: Secondary | ICD-10-CM | POA: Diagnosis present

## 2016-12-20 DIAGNOSIS — Z993 Dependence on wheelchair: Secondary | ICD-10-CM | POA: Diagnosis not present

## 2016-12-20 DIAGNOSIS — R062 Wheezing: Secondary | ICD-10-CM | POA: Diagnosis present

## 2016-12-20 DIAGNOSIS — K6289 Other specified diseases of anus and rectum: Secondary | ICD-10-CM | POA: Diagnosis not present

## 2016-12-20 DIAGNOSIS — N1330 Unspecified hydronephrosis: Secondary | ICD-10-CM

## 2016-12-20 DIAGNOSIS — Z7982 Long term (current) use of aspirin: Secondary | ICD-10-CM | POA: Diagnosis not present

## 2016-12-20 DIAGNOSIS — N183 Chronic kidney disease, stage 3 (moderate): Secondary | ICD-10-CM | POA: Diagnosis present

## 2016-12-20 DIAGNOSIS — N132 Hydronephrosis with renal and ureteral calculous obstruction: Secondary | ICD-10-CM | POA: Diagnosis present

## 2016-12-20 DIAGNOSIS — N3001 Acute cystitis with hematuria: Secondary | ICD-10-CM

## 2016-12-20 DIAGNOSIS — R509 Fever, unspecified: Secondary | ICD-10-CM

## 2016-12-20 DIAGNOSIS — B9620 Unspecified Escherichia coli [E. coli] as the cause of diseases classified elsewhere: Secondary | ICD-10-CM | POA: Diagnosis present

## 2016-12-20 DIAGNOSIS — N179 Acute kidney failure, unspecified: Secondary | ICD-10-CM | POA: Diagnosis present

## 2016-12-20 DIAGNOSIS — Q909 Down syndrome, unspecified: Secondary | ICD-10-CM

## 2016-12-20 DIAGNOSIS — F0280 Dementia in other diseases classified elsewhere without behavioral disturbance: Secondary | ICD-10-CM | POA: Diagnosis present

## 2016-12-20 DIAGNOSIS — R7989 Other specified abnormal findings of blood chemistry: Secondary | ICD-10-CM

## 2016-12-20 DIAGNOSIS — R609 Edema, unspecified: Secondary | ICD-10-CM | POA: Diagnosis not present

## 2016-12-20 DIAGNOSIS — Z87440 Personal history of urinary (tract) infections: Secondary | ICD-10-CM | POA: Diagnosis not present

## 2016-12-20 DIAGNOSIS — R6521 Severe sepsis with septic shock: Secondary | ICD-10-CM

## 2016-12-20 LAB — URINALYSIS, ROUTINE W REFLEX MICROSCOPIC
Bilirubin Urine: NEGATIVE
GLUCOSE, UA: NEGATIVE mg/dL
KETONES UR: NEGATIVE mg/dL
Nitrite: POSITIVE — AB
PROTEIN: NEGATIVE mg/dL
SQUAMOUS EPITHELIAL / LPF: NONE SEEN
Specific Gravity, Urine: 1.012 (ref 1.005–1.030)
pH: 6 (ref 5.0–8.0)

## 2016-12-20 LAB — CBC WITH DIFFERENTIAL/PLATELET
Basophils Absolute: 0 10*3/uL (ref 0.0–0.1)
Basophils Relative: 0 %
Eosinophils Absolute: 0 10*3/uL (ref 0.0–0.7)
Eosinophils Relative: 0 %
HCT: 31.7 % — ABNORMAL LOW (ref 39.0–52.0)
HEMOGLOBIN: 10.3 g/dL — AB (ref 13.0–17.0)
LYMPHS ABS: 1.1 10*3/uL (ref 0.7–4.0)
LYMPHS PCT: 16 %
MCH: 31.2 pg (ref 26.0–34.0)
MCHC: 32.5 g/dL (ref 30.0–36.0)
MCV: 96.1 fL (ref 78.0–100.0)
MONOS PCT: 12 %
Monocytes Absolute: 0.9 10*3/uL (ref 0.1–1.0)
NEUTROS PCT: 72 %
Neutro Abs: 5 10*3/uL (ref 1.7–7.7)
Platelets: 282 10*3/uL (ref 150–400)
RBC: 3.3 MIL/uL — AB (ref 4.22–5.81)
RDW: 15.8 % — ABNORMAL HIGH (ref 11.5–15.5)
WBC: 7 10*3/uL (ref 4.0–10.5)

## 2016-12-20 LAB — MRSA PCR SCREENING: MRSA BY PCR: POSITIVE — AB

## 2016-12-20 LAB — BLOOD GAS, ARTERIAL
Acid-Base Excess: 6.3 mmol/L — ABNORMAL HIGH (ref 0.0–2.0)
Bicarbonate: 30.3 mmol/L — ABNORMAL HIGH (ref 20.0–28.0)
DRAWN BY: 270211
FIO2: 0.21
O2 SAT: 95.3 %
PATIENT TEMPERATURE: 98.6
PO2 ART: 77.1 mmHg — AB (ref 83.0–108.0)
pCO2 arterial: 43 mmHg (ref 32.0–48.0)
pH, Arterial: 7.462 — ABNORMAL HIGH (ref 7.350–7.450)

## 2016-12-20 LAB — COMPREHENSIVE METABOLIC PANEL
ALT: 13 U/L — ABNORMAL LOW (ref 17–63)
AST: 22 U/L (ref 15–41)
Albumin: 2.7 g/dL — ABNORMAL LOW (ref 3.5–5.0)
Alkaline Phosphatase: 64 U/L (ref 38–126)
Anion gap: 6 (ref 5–15)
BUN: 24 mg/dL — AB (ref 6–20)
CHLORIDE: 109 mmol/L (ref 101–111)
CO2: 30 mmol/L (ref 22–32)
Calcium: 9 mg/dL (ref 8.9–10.3)
Creatinine, Ser: 1.4 mg/dL — ABNORMAL HIGH (ref 0.61–1.24)
GFR, EST NON AFRICAN AMERICAN: 52 mL/min — AB (ref >60)
Glucose, Bld: 158 mg/dL — ABNORMAL HIGH (ref 65–99)
POTASSIUM: 4.1 mmol/L (ref 3.5–5.1)
SODIUM: 145 mmol/L (ref 135–145)
Total Bilirubin: 0.6 mg/dL (ref 0.3–1.2)
Total Protein: 6.9 g/dL (ref 6.5–8.1)

## 2016-12-20 LAB — I-STAT CG4 LACTIC ACID, ED
LACTIC ACID, VENOUS: 1.5 mmol/L (ref 0.5–1.9)
LACTIC ACID, VENOUS: 1.08 mmol/L (ref 0.5–1.9)
Lactic Acid, Venous: 1.75 mmol/L (ref 0.5–1.9)

## 2016-12-20 MED ORDER — LEVOTHYROXINE SODIUM 88 MCG PO TABS
88.0000 ug | ORAL_TABLET | Freq: Every day | ORAL | Status: DC
  Administered 2016-12-22 – 2016-12-25 (×4): 88 ug via ORAL
  Filled 2016-12-20 (×4): qty 1

## 2016-12-20 MED ORDER — BISACODYL 10 MG RE SUPP: 10.0000 mg | Freq: Every day | RECTAL | Status: DC | PRN

## 2016-12-20 MED ORDER — CARBAMIDE PEROXIDE 6.5 % OT SOLN
5.0000 [drp] | Freq: Two times a day (BID) | OTIC | Status: DC | PRN
  Filled 2016-12-20: qty 15

## 2016-12-20 MED ORDER — ALBUTEROL SULFATE (2.5 MG/3ML) 0.083% IN NEBU
INHALATION_SOLUTION | RESPIRATORY_TRACT | Status: AC
  Filled 2016-12-20: qty 3

## 2016-12-20 MED ORDER — SODIUM CHLORIDE 0.9% FLUSH
3.0000 mL | Freq: Two times a day (BID) | INTRAVENOUS | Status: DC
  Administered 2016-12-20 – 2016-12-24 (×5): 3 mL via INTRAVENOUS

## 2016-12-20 MED ORDER — ASPIRIN 81 MG PO CHEW: 81.0000 mg | CHEWABLE_TABLET | Freq: Every day | ORAL | Status: DC

## 2016-12-20 MED ORDER — CHLORHEXIDINE GLUCONATE 0.12 % MT SOLN
15.0000 mL | Freq: Two times a day (BID) | OROMUCOSAL | Status: DC
  Administered 2016-12-20 – 2016-12-24 (×8): 15 mL via OROMUCOSAL
  Filled 2016-12-20 (×8): qty 15

## 2016-12-20 MED ORDER — MEMANTINE HCL ER 28 MG PO CP24
28.0000 mg | ORAL_CAPSULE | Freq: Every day | ORAL | Status: DC
  Administered 2016-12-23: 28 mg via ORAL
  Filled 2016-12-20 (×4): qty 1

## 2016-12-20 MED ORDER — MUPIROCIN 2 % EX OINT
1.0000 "application " | TOPICAL_OINTMENT | Freq: Two times a day (BID) | CUTANEOUS | Status: DC
  Administered 2016-12-20 – 2016-12-24 (×8): 1 via NASAL
  Filled 2016-12-20 (×2): qty 22

## 2016-12-20 MED ORDER — DIPHENHYDRAMINE HCL 12.5 MG/5ML PO ELIX: 25.0000 mg | ORAL_SOLUTION | Freq: Four times a day (QID) | ORAL | Status: DC | PRN

## 2016-12-20 MED ORDER — SODIUM CHLORIDE 0.9 % IV BOLUS (SEPSIS)
500.0000 mL | Freq: Once | INTRAVENOUS | Status: AC
  Administered 2016-12-20: 500 mL via INTRAVENOUS

## 2016-12-20 MED ORDER — ENOXAPARIN SODIUM 40 MG/0.4ML ~~LOC~~ SOLN
40.0000 mg | SUBCUTANEOUS | Status: DC
  Administered 2016-12-20: 40 mg via SUBCUTANEOUS
  Filled 2016-12-20: qty 0.4

## 2016-12-20 MED ORDER — SODIUM CHLORIDE 0.9 % IV BOLUS (SEPSIS)
1000.0000 mL | Freq: Once | INTRAVENOUS | Status: AC
  Administered 2016-12-20: 1000 mL via INTRAVENOUS

## 2016-12-20 MED ORDER — FLUTICASONE PROPIONATE 50 MCG/ACT NA SUSP
1.0000 | Freq: Every day | NASAL | Status: DC
  Administered 2016-12-21 – 2016-12-24 (×4): 1 via NASAL
  Filled 2016-12-20: qty 16

## 2016-12-20 MED ORDER — HYDROCORTISONE 1 % EX CREA
1.0000 "application " | TOPICAL_CREAM | Freq: Four times a day (QID) | CUTANEOUS | Status: DC | PRN
  Filled 2016-12-20: qty 28

## 2016-12-20 MED ORDER — ALBUTEROL SULFATE (2.5 MG/3ML) 0.083% IN NEBU
2.5000 mg | INHALATION_SOLUTION | Freq: Two times a day (BID) | RESPIRATORY_TRACT | Status: DC
  Administered 2016-12-20: 2.5 mg via RESPIRATORY_TRACT

## 2016-12-20 MED ORDER — ORAL CARE MOUTH RINSE
15.0000 mL | Freq: Two times a day (BID) | OROMUCOSAL | Status: DC
  Administered 2016-12-21 – 2016-12-24 (×7): 15 mL via OROMUCOSAL

## 2016-12-20 MED ORDER — SODIUM CHLORIDE 0.9 % IV SOLN
1000.0000 mL | INTRAVENOUS | Status: DC
  Administered 2016-12-20 – 2016-12-21 (×4): 1000 mL via INTRAVENOUS

## 2016-12-20 MED ORDER — DEXTROSE 5 % IV SOLN
1.0000 g | INTRAVENOUS | Status: DC
  Filled 2016-12-20: qty 10

## 2016-12-20 MED ORDER — AQUAPHOR EX OINT
1.0000 "application " | TOPICAL_OINTMENT | Freq: Two times a day (BID) | CUTANEOUS | Status: DC
  Administered 2016-12-20 – 2016-12-22 (×4): 1 via TOPICAL
  Filled 2016-12-20 (×3): qty 50

## 2016-12-20 MED ORDER — TAMSULOSIN HCL 0.4 MG PO CAPS
0.4000 mg | ORAL_CAPSULE | Freq: Every day | ORAL | Status: DC
Start: 2016-12-21 — End: 2016-12-25
  Administered 2016-12-23 – 2016-12-25 (×3): 0.4 mg via ORAL
  Filled 2016-12-20 (×3): qty 1

## 2016-12-20 MED ORDER — CLINDAMYCIN PHOSPHATE 1 % EX GEL: 1.0000 "application " | Freq: Every day | CUTANEOUS | Status: DC

## 2016-12-20 MED ORDER — DONEPEZIL HCL 10 MG PO TABS
10.0000 mg | ORAL_TABLET | Freq: Every day | ORAL | Status: DC
  Administered 2016-12-23 – 2016-12-24 (×2): 10 mg via ORAL
  Filled 2016-12-20 (×2): qty 1

## 2016-12-20 MED ORDER — DEXTROSE 5 % IV SOLN
1.0000 g | Freq: Once | INTRAVENOUS | Status: AC
  Administered 2016-12-20: 1 g via INTRAVENOUS
  Filled 2016-12-20: qty 10

## 2016-12-20 MED ORDER — ALLOPURINOL 100 MG PO TABS
100.0000 mg | ORAL_TABLET | Freq: Every day | ORAL | Status: DC
  Administered 2016-12-22 – 2016-12-24 (×3): 100 mg via ORAL
  Filled 2016-12-20 (×4): qty 1

## 2016-12-20 MED ORDER — MEMANTINE HCL-DONEPEZIL HCL ER 28-10 MG PO CP24: 1.0000 | ORAL_CAPSULE | Freq: Every day | ORAL | Status: DC

## 2016-12-20 MED ORDER — CHLORHEXIDINE GLUCONATE CLOTH 2 % EX PADS
6.0000 | MEDICATED_PAD | Freq: Every day | CUTANEOUS | Status: DC
  Administered 2016-12-22 – 2016-12-24 (×3): 6 via TOPICAL

## 2016-12-20 MED ORDER — ALBUTEROL SULFATE (2.5 MG/3ML) 0.083% IN NEBU: 3.0000 mL | INHALATION_SOLUTION | Freq: Four times a day (QID) | RESPIRATORY_TRACT | Status: DC | PRN

## 2016-12-20 MED ORDER — FAMOTIDINE 20 MG PO TABS
20.0000 mg | ORAL_TABLET | Freq: Every day | ORAL | Status: DC
  Administered 2016-12-20 – 2016-12-24 (×4): 20 mg via ORAL
  Filled 2016-12-20 (×4): qty 1

## 2016-12-20 NOTE — ED Provider Notes (Addendum)
[facility]-EMERGENCY DEPT Provider Note   CSN: [id number] Arrival date & time: 12/20/16  [id number]     History   Chief Complaint No chief complaint on file.  Triage note: Per EMS, pt from [facility] group home.  Pt ?altered mental status per facility.  Pt has baseline dementia.  Pt more lethargic than normal.  Pt has hx of asthma.  Pt initial sats 94% ra with wheezing.  Pt given neb treatment in route.  Pt is responding to staff and following general commands.  HX DM.  cbg 207.  Vitals:  100/60, hr 72, resp 18.  Remainder of history, ROS, and physical exam limited due to patient's condition (Dementia). Additional information was obtained from EMS and Facility staff.   Level V Caveat.   HPI [name] LiasJohn W [name] is a 66 y.o. male.  Advice workeracility manager reported that the patient was noted to be slumped over this morning. They report that he typically feels a little bit more tired in the morning however throughout the day he improves. However today they felt that his lethargy was more severe than normal was not improving. They deny any recent fevers, illnesses or known infections. They deny any emesis or diarrhea. They reported that patient has not been complaining of any issues.  The history is provided by the EMS personnel and the patient.    Past Medical History:  Diagnosis Date  . Diabetes mellitus without complication (HCC)   . Down syndrome     Patient Active Problem List   Diagnosis Date Noted  . Metabolic encephalopathy 10/24/2016  . E coli bacteremia   . Acute cystitis without hematuria   . UTI (urinary tract infection) 10/23/2016  . Urinary tract infection 10/22/2016  . CKD (chronic kidney disease) stage 3, GFR 30-59 ml/min 10/22/2016  . Anemia 10/22/2016  . Hypotension   . Diabetes mellitus with complication (HCC)   . Sepsis (HCC)   . Chest pain 12/29/2014  . Pure hypercholesterolemia 05/14/2013  . DOWNS SYNDROME 09/14/2010  . ABDOMINAL PAIN, EPIGASTRIC 09/14/2010  . Type 2 diabetes  mellitus, controlled (HCC) 10/17/2006    History reviewed. No pertinent surgical history.     Home Medications    Prior to Admission medications   Medication Sig Start Date End Date Taking? Authorizing Provider  albuterol (PROVENTIL HFA;VENTOLIN HFA) 108 (90 Base) MCG/ACT inhaler Inhale 2 puffs into the lungs every 6 (six) hours as needed for wheezing or shortness of breath (bronchospasm).    Historical Provider, MD  albuterol (PROVENTIL) (2.5 MG/3ML) 0.083% nebulizer solution Take 2.5 mg by nebulization 2 (two) times daily.     Historical Provider, MD  allopurinol (ZYLOPRIM) 100 MG tablet Take 100 mg by mouth at bedtime.  04/20/13   Historical Provider, MD  amLODipine (NORVASC) 2.5 MG tablet Take 2.5 mg by mouth every evening.     Historical Provider, MD  aspirin 81 MG tablet Take 81 mg by mouth daily.    Historical Provider, MD  Calcium Carbonate-Vitamin D (CALCIUM + D PO) Take 1 tablet by mouth daily.     Historical Provider, MD  ciprofloxacin (CIPRO) 500 MG tablet Take 1 tablet (500 mg total) by mouth 2 (two) times daily. 10/24/16   Tyrone Nineyan B Grunz, MD  clindamycin (CLINDAGEL) 1 % gel Apply 1 application topically as needed (for irritation).  11/17/14   Historical Provider, MD  Control Gel Formula Dressing (DUODERM CGF BORDER EX) Apply 3 each topically as needed.    Historical Provider, MD  [employer] MELATONIN 3 MG  TABS Take 3 mg by mouth at bedtime.  09/08/14   Historical Provider, MD  fluticasone (FLONASE) 50 MCG/ACT nasal spray Place 2 sprays into both nostrils as needed for allergies.  09/28/14   Historical Provider, MD  ibuprofen (ADVIL,MOTRIN) 400 MG tablet Take 1 tablet (400 mg total) by mouth every 8 (eight) hours as needed. Patient not taking: Reported on 10/05/2016 07/12/14   Francee Piccolo, PA-C  lactulose [facility]) 10 GM/15ML solution Take 6 g by mouth daily. Per MD 10mL 02/26/13   Historical Provider, MD  levothyroxine (SYNTHROID, LEVOTHROID) 25 MCG tablet Take 25 mcg by mouth daily  before breakfast.    Historical Provider, MD  loperamide (IMODIUM) 2 MG capsule Take 1 capsule (2 mg total) by mouth 4 (four) times daily as needed for diarrhea or loose stools. Patient not taking: Reported on 10/05/2016 01/01/15   Gilda Crease, MD  Memantine HCl-Donepezil HCl [facility]) 28-10 MG CP24 Take 1 capsule by mouth daily.     Historical Provider, MD  mineral oil-hydrophilic petrolatum (AQUAPHOR) ointment Apply 1 application topically 2 (two) times daily.    Historical Provider, MD  oxybutynin (DITROPAN) 5 MG tablet Take 2.5 mg by mouth daily.  03/27/14   Historical Provider, MD  pravastatin (PRAVACHOL) 20 MG tablet Take 20 mg by mouth daily.  04/20/13   Historical Provider, MD  ranitidine (ZANTAC) 150 MG tablet Take 150 mg by mouth 2 (two) times daily.  10/19/14   Historical Provider, MD  tamsulosin (FLOMAX) 0.4 MG CAPS capsule Take 0.4 mg by mouth daily.  04/20/13   Historical Provider, MD    Family History No family history on file.  Social History Social History  Substance Use Topics  . Smoking status: Never Smoker  . Smokeless tobacco: Never Used  . Alcohol use No     Allergies   Patient has no known allergies.   Review of Systems Review of Systems  Unable to perform ROS: Dementia     Physical Exam Updated Vital Signs BP (!) 78/44   Pulse 79   Temp 99.2 F (37.3 C) (Rectal)   Resp 20   Wt 105 lb (47.6 kg)   SpO2 (!) 80%   BMI 19.82 kg/m   Physical Exam  Constitutional: He appears well-developed and well-nourished. No distress.  HENT:  Head: Normocephalic and atraumatic.  Nose: Nose normal.  Down's Syndrome Facies  Eyes: Conjunctivae and EOM are normal. Pupils are equal, round, and reactive to light. Right eye exhibits no discharge. Left eye exhibits no discharge. No scleral icterus.  Neck: Normal range of motion. Neck supple.  Cardiovascular: Normal rate and regular rhythm.  Exam reveals no gallop and no friction rub.   No murmur  heard. Pulmonary/Chest: Effort normal and breath sounds normal. No stridor. No respiratory distress. He has no rales.  Abdominal: Soft. He exhibits no distension. There is no tenderness.  Musculoskeletal: He exhibits no edema or tenderness.  Neurological: He is alert. He is disoriented. GCS eye subscore is 4. GCS verbal subscore is 4. GCS motor subscore is 6.  Skin: Skin is warm and dry. No rash noted. He is not diaphoretic. No erythema.  Psychiatric: He has a normal mood and affect.  Vitals reviewed.    ED Treatments / Results  Labs (all labs ordered are listed, but only abnormal results are displayed) Labs Reviewed  COMPREHENSIVE METABOLIC PANEL - Abnormal; Notable for the following:       Result Value   Glucose, Bld 158 (*)  BUN 24 (*)    Creatinine, Ser 1.40 (*)    Albumin 2.7 (*)    ALT 13 (*)    GFR calc non Af Amer 52 (*)    All other components within normal limits  CBC WITH DIFFERENTIAL/PLATELET - Abnormal; Notable for the following:    RBC 3.30 (*)    Hemoglobin 10.3 (*)    HCT 31.7 (*)    RDW 15.8 (*)    All other components within normal limits  URINALYSIS, ROUTINE W REFLEX MICROSCOPIC - Abnormal; Notable for the following:    Hgb urine dipstick MODERATE (*)    Nitrite POSITIVE (*)    Leukocytes, UA SMALL (*)    Bacteria, UA MANY (*)    All other components within normal limits  BLOOD GAS, ARTERIAL - Abnormal; Notable for the following:    pH, Arterial 7.462 (*)    pO2, Arterial 77.1 (*)    Bicarbonate 30.3 (*)    Acid-Base Excess 6.3 (*)    All other components within normal limits  CULTURE, BLOOD (ROUTINE X 2)  CULTURE, BLOOD (ROUTINE X 2)  URINE CULTURE  I-STAT CG4 LACTIC ACID, ED  I-STAT CG4 LACTIC ACID, ED  I-STAT CG4 LACTIC ACID, ED    EKG  EKG Interpretation  Date/Time:  Thursday Dec 20 2016 10:13:11 EDT Ventricular Rate:  68 PR Interval:    QRS Duration: 122 QT Interval:  420 QTC Calculation: 447 R Axis:   32 Text Interpretation:   Sinus rhythm Right bundle branch block No significant change since last tracing Confirmed by [facility] MD, Megha Agnes ([id number]) on 12/20/2016 10:56:48 AM       Radiology Dg Chest Port 1 View  Result Date: 12/20/2016 CLINICAL DATA:  Actively wheezing. EXAM: PORTABLE CHEST 1 VIEW COMPARISON:  10/22/2016 FINDINGS: 1011 hours. Low lung volumes. Cardiopericardial silhouette is at upper limits of normal for size. The lungs are clear wiithout focal pneumonia, edema, pneumothorax or pleural effusion. Bones are diffusely demineralized. Degenerative changes noted right shoulder. Telemetry leads overlie the chest. IMPRESSION: No active disease. Electronically Signed   By: Kennith Center M.D.   On: 12/20/2016 10:30    Procedures Procedures (including critical care time) CRITICAL CARE Performed by: Amadeo Garnet Raymone Pembroke Total critical care time: 55 minutes Critical care time was exclusive of separately billable procedures and treating other patients. Critical care was necessary to treat or prevent imminent or life-threatening deterioration. Critical care was time spent personally by me on the following activities: development of treatment plan with patient and/or surrogate as well as nursing, discussions with consultants, evaluation of patient's response to treatment, examination of patient, obtaining history from patient or surrogate, ordering and performing treatments and interventions, ordering and review of laboratory studies, ordering and review of radiographic studies, pulse oximetry and re-evaluation of patient's condition.   Medications Ordered in ED Medications  0.9 %  sodium chloride infusion (1,000 mLs Intravenous New Bag/Given 12/20/16 1342)  cefTRIAXone (ROCEPHIN) 1 g in dextrose 5 % 50 mL IVPB (not administered)  sodium chloride 0.9 % bolus 1,000 mL (1,000 mLs Intravenous New Bag/Given 12/20/16 1549)    And  sodium chloride 0.9 % bolus 500 mL (not administered)  sodium chloride 0.9 % bolus 1,000 mL (0  mLs Intravenous Stopped 12/20/16 1224)  sodium chloride 0.9 % bolus 1,000 mL (0 mLs Intravenous Stopped 12/20/16 1342)     Initial Impression / Assessment and Plan / ED Course  I have reviewed the triage vital signs and the nursing notes.  Pertinent labs & imaging results that were available during my care of the patient were reviewed by me and considered in my medical decision making (see chart for details).  Clinical Course as of Dec 21 2038  Thu Dec 20, 2016  1030 Hypotension from undetermined source. Pt is afebrile with no obvious source of infection at this time.Possible dehydration as sNF staff member mentioned that he does not take much PO fluid.  Will obtain infectious work up to assess for possible sepsis.  [PC]  1230 No evidence of PNA. UA still pending.  [PC]  1445 Workup revealed urinary tract infection. With hypotension code sepsis was initiated. Patient had already received 30 mL/kg when code sepsis was called. She given Rocephin to cover for urinary source. Patient maintaining maps above 65 with IV fluids. No indication for pressors at this time however patient will need to be admitted for continued management.  [PC]    Clinical Course User Index [PC] Nira Conn, MD      Final Clinical Impressions(s) / ED Diagnoses   Final diagnoses:  Septic shock (HCC)  Acute cystitis with hematuria        Nira Conn, MD 12/20/16 2040

## 2016-12-20 NOTE — H&P (Addendum)
History and Physical  [name] [name] ZOX:[id number] DOB: 1950-08-26 DOA: 12/20/2016  Referring physician: EDP PCP: Lucretia Field   Chief Complaint: altered mental status  (patient can not provide history, history gathered by chart review, talking to EDP and group home care giver at bedside)  HPI: [name] [name] is a 66 y.o. male   Has a h/o mental retardation/dementia from a group home, baseline wheelchair bound, total care, is brought to [facility] ED due to more lethargic than normal.   ED course, he has hypotension on presentation, ua + uti, blood culture and urine culture obtained from the ED, he is started on ivf /rocephin, hospitalist call to admit the patient, due to concerning for hypotension, stepdown unit is requested.   Of note patient was hospitalized in march /2018 for uti, care giver denies new onset of diarrhea.   Review of Systems:  Detail per HPI, Review of systems are otherwise negative  Past Medical History:  Diagnosis Date  . Diabetes mellitus without complication (HCC)   . Down syndrome    History reviewed. No pertinent surgical history. Social History:  reports that he has never smoked. He has never used smokeless tobacco. He reports that he does not drink alcohol or use drugs. Patient lives at a group home & is total care, on puree diet and thin liquid per care giver He need to be fed, he is bed to wheelchair bound, bowel and bladder incontinence at baseline   No Known Allergies  No family history on file.    Prior to Admission medications   Medication Sig Start Date End Date Taking? Authorizing Provider  albuterol (PROVENTIL HFA;VENTOLIN HFA) 108 (90 Base) MCG/ACT inhaler Inhale 2 puffs into the lungs every 6 (six) hours as needed for wheezing or shortness of breath (bronchospasm).    Historical Provider, MD  albuterol (PROVENTIL) (2.5 MG/3ML) 0.083% nebulizer solution Take 2.5 mg by nebulization 2 (two) times daily.     Historical Provider, MD  allopurinol  (ZYLOPRIM) 100 MG tablet Take 100 mg by mouth at bedtime.  04/20/13   Historical Provider, MD  amLODipine (NORVASC) 2.5 MG tablet Take 2.5 mg by mouth every evening.     Historical Provider, MD  aspirin 81 MG tablet Take 81 mg by mouth daily.    Historical Provider, MD  Calcium Carbonate-Vitamin D (CALCIUM + D PO) Take 1 tablet by mouth daily.     Historical Provider, MD  ciprofloxacin (CIPRO) 500 MG tablet Take 1 tablet (500 mg total) by mouth 2 (two) times daily. 10/24/16   Tyrone Nine, MD  clindamycin (CLINDAGEL) 1 % gel Apply 1 application topically as needed (for irritation).  11/17/14   Historical Provider, MD  Control Gel Formula Dressing (DUODERM CGF BORDER EX) Apply 3 each topically as needed.    Historical Provider, MD  [employer]S MELATONIN 3 MG TABS Take 3 mg by mouth at bedtime.  09/08/14   Historical Provider, MD  fluticasone (FLONASE) 50 MCG/ACT nasal spray Place 2 sprays into both nostrils as needed for allergies.  09/28/14   Historical Provider, MD  ibuprofen (ADVIL,MOTRIN) 400 MG tablet Take 1 tablet (400 mg total) by mouth every 8 (eight) hours as needed. Patient not taking: Reported on 10/05/2016 07/12/14   Francee Piccolo, PA-C  lactulose [facility]) 10 GM/15ML solution Take 6 g by mouth daily. Per MD 10mL 02/26/13   Historical Provider, MD  levothyroxine (SYNTHROID, LEVOTHROID) 25 MCG tablet Take 25 mcg by mouth daily before breakfast.  Historical Provider, MD  loperamide (IMODIUM) 2 MG capsule Take 1 capsule (2 mg total) by mouth 4 (four) times daily as needed for diarrhea or loose stools. Patient not taking: Reported on 10/05/2016 01/01/15   Gilda Creasehristopher J Pollina, MD  Memantine HCl-Donepezil HCl [facility](NAMZARIC) 28-10 MG CP24 Take 1 capsule by mouth daily.     Historical Provider, MD  mineral oil-hydrophilic petrolatum (AQUAPHOR) ointment Apply 1 application topically 2 (two) times daily.    Historical Provider, MD  oxybutynin (DITROPAN) 5 MG tablet Take 2.5 mg by mouth daily.  03/27/14    Historical Provider, MD  pravastatin (PRAVACHOL) 20 MG tablet Take 20 mg by mouth daily.  04/20/13   Historical Provider, MD  ranitidine (ZANTAC) 150 MG tablet Take 150 mg by mouth 2 (two) times daily.  10/19/14   Historical Provider, MD  tamsulosin (FLOMAX) 0.4 MG CAPS capsule Take 0.4 mg by mouth daily.  04/20/13   Historical Provider, MD    Physical Exam: BP (!) 137/105   Pulse 68   Temp 99.2 F (37.3 C) (Rectal)   Resp 14   Wt 47.6 kg (105 lb)   SpO2 98%   BMI 19.82 kg/m   General:  Chronic Developmental abnormalities , does talk with indistinct words, does not follow commands,  Eyes: PERRL ENT: very dry oral mucosa, poor dentition  Neck: supple, no JVD Cardiovascular: RRR Respiratory: CTABL Abdomen: soft, positive bowel sounds, does not seem to be tender Skin: no rash Musculoskeletal:  No edema Psychiatric: not intelligible  Neurologic: not oriented, not follow commands, not interactive          Labs on Admission:  Basic Metabolic Panel:  Recent Labs Lab 12/20/16 1054  NA 145  K 4.1  CL 109  CO2 30  GLUCOSE 158*  BUN 24*  CREATININE 1.40*  CALCIUM 9.0   Liver Function Tests:  Recent Labs Lab 12/20/16 1054  AST 22  ALT 13*  ALKPHOS 64  BILITOT 0.6  PROT 6.9  ALBUMIN 2.7*   No results for input(s): LIPASE, AMYLASE in the last 168 hours. No results for input(s): AMMONIA in the last 168 hours. CBC:  Recent Labs Lab 12/20/16 1054  WBC 7.0  NEUTROABS 5.0  HGB 10.3*  HCT 31.7*  MCV 96.1  PLT 282   Cardiac Enzymes: No results for input(s): CKTOTAL, CKMB, CKMBINDEX, TROPONINI in the last 168 hours.  BNP (last 3 results) No results for input(s): BNP in the last 8760 hours.  ProBNP (last 3 results) No results for input(s): PROBNP in the last 8760 hours.  CBG: No results for input(s): GLUCAP in the last 168 hours.  Radiological Exams on Admission: Dg Chest Port 1 View  Result Date: 12/20/2016 CLINICAL DATA:  Actively wheezing. EXAM: PORTABLE  CHEST 1 VIEW COMPARISON:  10/22/2016 FINDINGS: 1011 hours. Low lung volumes. Cardiopericardial silhouette is at upper limits of normal for size. The lungs are clear wiithout focal pneumonia, edema, pneumothorax or pleural effusion. Bones are diffusely demineralized. Degenerative changes noted right shoulder. Telemetry leads overlie the chest. IMPRESSION: No active disease. Electronically Signed   By: Kennith CenterEric  Mansell M.D.   On: 12/20/2016 10:30      Assessment/Plan Present on Admission: . Sepsis (HCC)    Sepsis with recurrent uti:  Due to hypotension will admit to stepdown,  cxr no acute findings, Blood culture /urine culture pending, will get ct renal stone study ( not sure if patient is able to lay still long enough for study, but will at least  try, may need to have renal US if not able to get ct renal stone study)  keep on rocephin 2gram for now, change to 1gram if blood culture no growth, ct ab no complicating features. Home home bp meds norvasc  Elevated cr: aki vs ckd, patient's cr wnl in 2017, but cr have been elevated during last hospitalization and this time.  Treat uti, hydration, ct renal stone study ( vs renal US)  ?noninsulin dependent diabetes: per chart review, he does not appear to be on any meds for this,  will check a1c for now  Chronic normocytic anemia: hgb 10.3 at baseline  hypothyroidism: continue home meds synthroid  h/o asthma: no wheezing on exam, cxr no acute findings, he in on room air, continue home meds.  Down syndrome/dementia: continue home meds, baseline total care  Diet: currently npo, will need speech eval once mental status back to baseline, per care giver, he is on puree diet and thin liquid at group home  DVT prophylaxis: lovenox   Consultants: none  Code Status: full   Family Communication:  Care giver at bedside, per care giver, patient is a ward of state  Disposition Plan: admit to stepdown  Time spent:  Crescentia Boutwell MD, PhD [facility] Pager [phone] If 7PM-7AM, please contact night-coverage at [email or web address], password [email or web address]

## 2016-12-20 NOTE — ED Notes (Addendum)
ICU charge did not want patient transferred until notified after shift change due to receiving several new patients.

## 2016-12-20 NOTE — ED Notes (Signed)
RN AT BEDSIDE COLLECTING LABS 

## 2016-12-20 NOTE — ED Notes (Signed)
Bed: WA17 Expected date:  Expected time:  Means of arrival:  Comments: EMS Altered LOC

## 2016-12-20 NOTE — ED Triage Notes (Signed)
Per EMS, pt from [facility] group home.  Pt ?altered mental status per facility.  Pt has baseline dementia.  Pt more lethargic than normal.  Pt has hx of asthma.  Pt initial sats 94% ra with wheezing.  Pt given neb treatment in route.  Pt is responding to staff and following general commands.  HX DM.  cbg 207.  Vitals:  100/60, hr 72, resp 18,

## 2016-12-20 NOTE — ED Notes (Signed)
Report given to [facility] [place].

## 2016-12-21 ENCOUNTER — Inpatient Hospital Stay (HOSPITAL_COMMUNITY): Payer: Medicare Other

## 2016-12-21 DIAGNOSIS — R609 Edema, unspecified: Secondary | ICD-10-CM

## 2016-12-21 DIAGNOSIS — N179 Acute kidney failure, unspecified: Secondary | ICD-10-CM

## 2016-12-21 DIAGNOSIS — N201 Calculus of ureter: Secondary | ICD-10-CM

## 2016-12-21 DIAGNOSIS — N1330 Unspecified hydronephrosis: Secondary | ICD-10-CM

## 2016-12-21 LAB — CBC
HEMATOCRIT: 27.9 % — AB (ref 39.0–52.0)
Hemoglobin: 8.9 g/dL — ABNORMAL LOW (ref 13.0–17.0)
MCH: 30.6 pg (ref 26.0–34.0)
MCHC: 31.9 g/dL (ref 30.0–36.0)
MCV: 95.9 fL (ref 78.0–100.0)
PLATELETS: 289 10*3/uL (ref 150–400)
RBC: 2.91 MIL/uL — ABNORMAL LOW (ref 4.22–5.81)
RDW: 15.8 % — AB (ref 11.5–15.5)
WBC: 9.1 10*3/uL (ref 4.0–10.5)

## 2016-12-21 LAB — BASIC METABOLIC PANEL
ANION GAP: 7 (ref 5–15)
BUN: 16 mg/dL (ref 6–20)
CALCIUM: 8.1 mg/dL — AB (ref 8.9–10.3)
CO2: 27 mmol/L (ref 22–32)
CREATININE: 1.12 mg/dL (ref 0.61–1.24)
Chloride: 110 mmol/L (ref 101–111)
GLUCOSE: 110 mg/dL — AB (ref 65–99)
Potassium: 3.9 mmol/L (ref 3.5–5.1)
Sodium: 144 mmol/L (ref 135–145)

## 2016-12-21 LAB — HIV ANTIBODY (ROUTINE TESTING W REFLEX): HIV Screen 4th Generation wRfx: NONREACTIVE

## 2016-12-21 MED ORDER — ACETAMINOPHEN 650 MG RE SUPP
650.0000 mg | RECTAL | Status: DC | PRN
  Administered 2016-12-21 – 2016-12-23 (×3): 650 mg via RECTAL
  Filled 2016-12-21 (×3): qty 1

## 2016-12-21 MED ORDER — ACETAMINOPHEN 325 MG PO TABS: 650.0000 mg | ORAL_TABLET | Freq: Four times a day (QID) | ORAL | Status: DC | PRN

## 2016-12-21 MED ORDER — BISACODYL 10 MG RE SUPP
10.0000 mg | Freq: Every day | RECTAL | Status: AC
  Filled 2016-12-21: qty 1

## 2016-12-21 MED ORDER — METRONIDAZOLE IN NACL 5-0.79 MG/ML-% IV SOLN
500.0000 mg | Freq: Three times a day (TID) | INTRAVENOUS | Status: DC
  Administered 2016-12-21 – 2016-12-24 (×8): 500 mg via INTRAVENOUS
  Filled 2016-12-21 (×8): qty 100

## 2016-12-21 NOTE — Progress Notes (Signed)
Pt febrile to 102 F. [facility] provider on call notified and PR Tylenol ordered and given.

## 2016-12-21 NOTE — Progress Notes (Signed)
VASCULAR LAB PRELIMINARY  PRELIMINARY  PRELIMINARY  PRELIMINARY  Left lower extremity venous duplex completed.    Preliminary report: Technically difficult due to patient inability to cooperate.  Left:  No obvious evidence of DVT, superficial thrombosis, or Baker's cyst.  Shaune Malacara, RVS 12/21/2016, 4:43 PM

## 2016-12-21 NOTE — Care Management Note (Signed)
Case Management Note  Patient Details  Name: Roque LiasJohn W Veiga MRN: [id number] Date of Birth: 1951/02/16  Subjective/Objective:        Mentally challenged male with increased lethargy and ams     Action/Plan: From Group Home [id number]/Hopelynn Gartland Earlene Plateravis, BSN, [place], FAO/[phone]/[phone] Chart review for patient progression. Chart review for case management needs: Next review due on [id number].  Expected Discharge Date:   (unknown)               Expected Discharge Plan:  Group Home  In-House Referral:  Clinical Social Work  Discharge planning Services  CM Consult  Post Acute Care Choice:    Choice offered to:     DME Arranged:    DME Agency:     HH Arranged:    HH Agency:     Status of Service:  In process, will continue to follow  If discussed at Long Length of Stay Meetings, dates discussed:    Additional Comments:  Golda AcreDavis, Teja Costen Lynn, RN 12/21/2016, 9:19 AM

## 2016-12-21 NOTE — Consult Note (Signed)
Urology Consult  CC: Referring physician: Janeece Fitting, MD Reason for referral: Hydronephrosis  Impression/Assessment:   1.  Bilateral hydronephrosis. - This may be chronic although he does have a 2 mm stone in the distal left ureter which certainly could be causing some obstruction the fact that he has no stones on the right-hand side and does have hydronephrosis raises the question of whether he has developed secondary vesicoureteral reflux.  I think the probability of stricture or urothelial neoplasm causing his right-sided hydronephrosis is exceedingly low.  I was not able to elicit any apparent discomfort to percussion of the flanks on examination.  In addition his creatinine was slightly elevated at 1.4 on admission but with hydration has returned to normal at 1.2 indicating that his hydronephrosis is likely not causing significant obstruction and any form of renal insufficiency.  2.  UTI - he presented to the ER with a picture of possible sepsis with hypotension but his white blood cell count was only 7.0 and lactic acid was found to be normal 2.  In addition he has not had any fever.  Based on these factors and the fact that clinically he appears to be improving on hydration and broad-spectrum antibiotics I do not believe there is any indication for emergent stent placement at this time.  Plan:  1.  I would recommend placing a Foley catheter rather than common catheter for the next few days. 2.  Await culture results while maintaining broad-spectrum antibiotics. 3.  Medical expulsive therapy with tamsulosin as he has passed multiple stones much bigger than this one in the past. 4.  Will recheck an ultrasound after his catheter has been in place and his bladder drained to check for any decrease in his bilateral hydronephrosis. 5.  Will follow and assist.    History of Present Illness: Advice worker reported that the patient was noted to be slumped over this morning. They report that he  typically feels a little bit more tired in the morning however throughout the day he improves. However today they felt that his lethargy was more severe than normal was not improving. They deny any recent fevers, illnesses or known infections. They deny any emesis or diarrhea. They reported that patient has not been complaining of any issues.  He was noted to be hypotensive in the emergency room and therefore an IV was started and fluid resuscitation initiated.  He has remained afebrile and a CT scan was obtained which revealed what was read by the radiologist as a 3 mm stone in the distal left ureter however it actually measures 2 mm in width and 3 mm in length.  There was bilateral hydronephrosis present but there was no stone on the right hand side yet hydronephrosis was noted area was a fairly large amount noted in the bladder but it did not appear to be overly distended.   The patient has a history of calculus disease and has passed several stones.  In 2011 he had a 7 mm and 5 mm stone in his right ureter as well as a 4 mm left renal calculus all which passed spontaneously as he has no stones in either right or left kidney now.  He has had a culture positive for 40K CFU E. coli that was pansensitive on 10/22/16. All of his history was obtained from his chart in the hospital, his chart in the office and his caregivers.  The patient is unable to give any history.   Past Medical History:  Diagnosis  Date  . Diabetes mellitus without complication (HCC)   . Down syndrome    History reviewed. No pertinent surgical history.  Medications:  Scheduled: . allopurinol  100 mg Oral QHS  . bisacodyl  10 mg Rectal Daily  . chlorhexidine  15 mL Mouth Rinse BID  . Chlorhexidine Gluconate Cloth  6 each Topical Daily  . clindamycin  1 application Topical Daily  . memantine  28 mg Oral Daily   And  . donepezil  10 mg Oral Q1500  . famotidine  20 mg Oral QHS  . fluticasone  1 spray Each Nare Daily  .  levothyroxine  88 mcg Oral QAC breakfast  . mouth rinse  15 mL Mouth Rinse q12n4p  . mineral oil-hydrophilic petrolatum  1 application Topical BID  . mupirocin ointment  1 application Nasal BID  . sodium chloride flush  3 mL Intravenous Q12H  . tamsulosin  0.4 mg Oral Daily   Continuous: . sodium chloride 1,000 mL (12/21/16 1000)  . cefTRIAXone (ROCEPHIN)  IV    . metronidazole      Allergies: No Known Allergies  No family history on file.  Social History:  reports that he has never smoked. He has never used smokeless tobacco. He reports that he does not drink alcohol or use drugs.  Review of Systems (10 point): Pertinent items are noted in HPI. A comprehensive review of systems was negative except as noted above.  Physical Exam:  Vital signs in last 24 hours: Temp:  [98.5 F (36.9 C)-100.2 F (37.9 C)] 98.5 F (36.9 C) (05/04 1135) Pulse Rate:  [61-85] 66 (05/04 1100) Resp:  [12-27] 27 (05/04 1100) BP: (90-159)/(38-105) 140/53 (05/04 1100) SpO2:  [80 %-100 %] 94 % (05/04 1100) Weight:  [108 lb 7.5 oz (49.2 kg)] 108 lb 7.5 oz (49.2 kg) (05/03 2015) General appearance: Awake and alert Head: Normocephalic, without obvious abnormality, atraumatic Eyes: conjunctivae/corneas clear. EOM's intact.  Oropharynx: Dry mucous membranes Neck: supple, symmetrical, trachea midline Resp: normal respiratory effort Cardio: regular rate and rhythm Back: symmetric, no curvature. ROM normal. No CVA tenderness. GI: soft, non-tender; bowel sounds normal; no masses,  no organomegaly  Male genitalia: penis: normal male phallus with condom catheter in place.  Urine clear. .Testes: bilaterally descended with no masses or tenderness. no hernias  Extremities: extremities normal, atraumatic, no cyanosis or edema Skin: Skin color normal. No visible rashes or lesions Neurologic: Moving all extremities.  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  Laboratory Data:   Recent Labs   12/20/16 1054 12/21/16 0409  WBC 7.0 9.1  HGB 10.3* 8.9*  HCT 31.7* 27.9*   BMET  Recent Labs  12/20/16 1054 12/21/16 0409  NA 145 144  K 4.1 3.9  CL 109 110  CO2 30 27  GLUCOSE 158* 110*  BUN 24* 16  CREATININE 1.40* 1.12  CALCIUM 9.0 8.1*   No results for input(s): LABPT, INR in the last 72 hours. No results for input(s): LABURIN in the last 72 hours. Results for orders placed or performed during the hospital encounter of 12/20/16  MRSA PCR Screening     Status: Abnormal   Collection Time: 12/20/16  8:22 PM  Result Value Ref Range Status   MRSA by PCR POSITIVE (A) NEGATIVE Final    Comment:        The GeneXpert MRSA Assay (FDA approved for NASAL specimens only), is one component of a comprehensive MRSA colonization surveillance program. It is not intended to diagnose MRSA infection nor  to guide or monitor treatment for MRSA infections. RESULT CALLED TO, READ BACK BY AND VERIFIED WITH: ODOM,S RN 5.3.18 @2232  ZANDO,C    Creatinine:  Recent Labs  12/20/16 1054 12/21/16 0409  CREATININE 1.40* 1.12    Imaging: Dg Chest Port 1 View  Result Date: 12/20/2016 CLINICAL DATA:  Actively wheezing. EXAM: PORTABLE CHEST 1 VIEW COMPARISON:  10/22/2016 FINDINGS: 1011 hours. Low lung volumes. Cardiopericardial silhouette is at upper limits of normal for size. The lungs are clear wiithout focal pneumonia, edema, pneumothorax or pleural effusion. Bones are diffusely demineralized. Degenerative changes noted right shoulder. Telemetry leads overlie the chest. IMPRESSION: No active disease. Electronically Signed   By: Kennith CenterEric  Mansell M.D.   On: 12/20/2016 10:30   Ct Renal Stone Study  Result Date: 12/20/2016 CLINICAL DATA:  Increasing lethargy. History of recurrent urinary tract infections and Down syndrome. EXAM: CT ABDOMEN AND PELVIS WITHOUT CONTRAST TECHNIQUE: Multidetector CT imaging of the abdomen and pelvis was performed following the standard protocol without IV contrast.  COMPARISON:  None. FINDINGS: Lower chest: The visualized cardiac chambers are mildly enlarged but stable. No pericardial effusion. There is minimal bibasilar atelectasis without pneumonic consolidation, effusion or pneumothorax. Hepatobiliary: Cholecystectomy. No biliary dilatation or hepatic mass. Pancreas: No pancreatic ductal dilatation. The noncontrast appearance of the pancreas is unremarkable. Spleen: Normal Adrenals/Urinary Tract: Stable normal appearing bilateral adrenal glands. Hydronephrosis noted bilaterally with faint calcifications in the interpolar aspect of the right kidney again noted. There appears to be a 3 mm calcification in the distal left UVJ, series 2, image 63 which may explain the dilated left renal collecting system. No calculus is seen on the right. The bladder however is markedly distended and vesicoureteral reflux accounting for this appearance may explain this appearance versus prior passage of a stone. A ureteral stricture is not entirely excluded or potentially a urothelial lesion but no conclusive CT findings to suggest such. Stomach/Bowel: No bowel dilatation. Stomach is contracted. There appears to be normal small bowel rotation. A moderate amount of stool is seen within large bowel. There is perirectal inflammation with thickening of rectal walls surrounding a large focus of stool. Findings would be in keeping with proctitis. Vascular/Lymphatic: No significant vascular findings are present. No enlarged abdominal or pelvic lymph nodes. Reproductive: Prostate is unremarkable. Other: No abdominal wall hernia or abnormality. No abdominopelvic ascites. Musculoskeletal: Mild dextroconvex curvature of the lumbar spine with degenerative disc disease L1 through L3. Grade 1 anterolisthesis of L4 on L5 with L3 through S1 facet arthropathy. IMPRESSION: 1. 3 mm left ureterovesical junction stone causing moderate left-sided hydronephrosis. 2. Hydronephrosis of the right renal collecting system  without obstructive uropathy noted. Findings could be related to a recently passed stone. Given the distended appearance of the bladder, this could be a result vesicoureteral reflux alternatively. Other etiologies including stricture or a urothelial lesion are not entirely excluded but believed less likely. 3. Rectal thickening with perirectal inflammation surrounding a large stool ball consistent with proctitis. 4. Lumbar spondylosis with dextroscoliosis. Electronically Signed   By: Tollie Ethavid  Kwon M.D.   On: 12/20/2016 19:42   CT scan images were independently reviewed with findings as noted above.    Azavion Bouillon C 12/21/2016, 12:30 PM

## 2016-12-21 NOTE — Progress Notes (Signed)
PROGRESS NOTE  [name] [name] WUJ:[id number] DOB: Sep 21, 1950 DOA: 12/20/2016 PCP: Lucretia Field  HPI/Recap of past 24 hours:  Patient not able to provide history due to underline mental retardation, he does not seem in pain, tmax 100.2, bp and heart rate has improved  Assessment/Plan: Active Problems:   Sepsis (HCC)   Sepsis with recurrent uti/hydronephrosis, proctitis:  Due to hypotension will admit to stepdown,  cxr no acute findings, Blood culture /urine culture pending, ct renal stone study + hydro, + proctitis    keep on rocephin 2gram for now, add flagyl,  home bp meds norvasc held, continue ivf  AKI/ CKD II/III/UVJ stone/bilateral hydronephrosis:  patient's cr wnl in 2017, but cr have been elevated during last hospitalization and this time.  Cr 1.4 on admission, 1.12 on 5/4, likely AKI on ckd II/III  "CT renal stone study:  1. 3 mm left ureterovesical junction stone causing moderate left-sided hydronephrosis. 2. Hydronephrosis of the right renal collecting system without obstructive uropathy noted. Findings could be related to a recently passed stone. Given the distended appearance of the bladder, this could be a result vesicoureteral reflux alternatively. Other etiologies including stricture or a urothelial lesion are not entirely excluded but believed less likely.  Continue abx, hydration, insert foley, urology consulted, case discussed with Dr Vernie Ammons over the phone   Constipation with proctitis: Ct :Rectal thickening with perirectal inflammation surrounding a large stool ball consistent with proctitis. Continue rocephin, add flagyl, stool regimen  ?noninsulin dependent diabetes: per chart review, he does not appear to be on any meds for this,  will check a1c for now  Chronic normocytic anemia: hgb 10.3 at baseline  hypothyroidism: continue home meds synthroid  h/o asthma: no wheezing on exam, cxr no acute findings, he in on room air, continue home  meds.  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  Diet: currently npo, will need speech eval once mental status back to baseline, per care giver, he is on puree diet and thin liquid at group home  DVT prophylaxis: lovenox held for possible urological procedure , currently on acd's   Code Status: full  Family Communication: patient   Disposition Plan: remain in stepdown   Consultants:  Urology Dr Vernie Ammons  Procedures:  none  Antibiotics:  Rocephin from admission  Flagyl from 5/4   Objective: BP (!) 140/53   Pulse 66   Temp 98.5 F (36.9 C) (Axillary)   Resp (!) 27   Ht 5' (1.524 m)   Wt 49.2 kg (108 lb 7.5 oz)   SpO2 94%   BMI 21.18 kg/m   Intake/Output Summary (Last 24 hours) at 12/21/16 1149 Last data filed at 12/21/16 1000  Gross per 24 hour  Intake             5125 ml  Output             1375 ml  Net             3750 ml   Filed Weights   12/20/16 1018 12/20/16 2015  Weight: 47.6 kg (105 lb) 49.2 kg (108 lb 7.5 oz)    Exam:   General:  Chronic Developmental abnormalities , does talk with indistinct words, does not follow commands,   Eyes: PERRL  ENT: very dry oral mucosa, poor dentition   Neck: supple, no JVD  Cardiovascular: RRR  Respiratory: CTABL  Abdomen: soft, positive bowel sounds, does not seem  to be tender  Skin: no rash  Musculoskeletal:  No edema  Psychiatric: not intelligible   Neurologic: not oriented, not follow commands, not interactive    Data Reviewed: Basic Metabolic Panel:  Recent Labs Lab 12/20/16 1054 12/21/16 0409  NA 145 144  K 4.1 3.9  CL 109 110  CO2 30 27  GLUCOSE 158* 110*  BUN 24* 16  CREATININE 1.40* 1.12  CALCIUM 9.0 8.1*   Liver Function Tests:  Recent Labs Lab 12/20/16 1054  AST 22  ALT 13*  ALKPHOS 64  BILITOT 0.6  PROT 6.9  ALBUMIN 2.7*   No results for input(s): LIPASE, AMYLASE  in the last 168 hours. No results for input(s): AMMONIA in the last 168 hours. CBC:  Recent Labs Lab 12/20/16 1054 12/21/16 0409  WBC 7.0 9.1  NEUTROABS 5.0  --   HGB 10.3* 8.9*  HCT 31.7* 27.9*  MCV 96.1 95.9  PLT 282 289   Cardiac Enzymes:   No results for input(s): CKTOTAL, CKMB, CKMBINDEX, TROPONINI in the last 168 hours. BNP (last 3 results) No results for input(s): BNP in the last 8760 hours.  ProBNP (last 3 results) No results for input(s): PROBNP in the last 8760 hours.  CBG: No results for input(s): GLUCAP in the last 168 hours.  Recent Results (from the past 240 hour(s))  MRSA PCR Screening     Status: Abnormal   Collection Time: 12/20/16  8:22 PM  Result Value Ref Range Status   MRSA by PCR POSITIVE (A) NEGATIVE Final    Comment:        The GeneXpert MRSA Assay (FDA approved for NASAL specimens only), is one component of a comprehensive MRSA colonization surveillance program. It is not intended to diagnose MRSA infection nor to guide or monitor treatment for MRSA infections. RESULT CALLED TO, READ BACK BY AND VERIFIED WITH: ODOM,S RN 5.3.18 @2232  ZANDO,C      Studies: Ct Renal Stone Study  Result Date: 12/20/2016 CLINICAL DATA:  Increasing lethargy. History of recurrent urinary tract infections and Down syndrome. EXAM: CT ABDOMEN AND PELVIS WITHOUT CONTRAST TECHNIQUE: Multidetector CT imaging of the abdomen and pelvis was performed following the standard protocol without IV contrast. COMPARISON:  None. FINDINGS: Lower chest: The visualized cardiac chambers are mildly enlarged but stable. No pericardial effusion. There is minimal bibasilar atelectasis without pneumonic consolidation, effusion or pneumothorax. Hepatobiliary: Cholecystectomy. No biliary dilatation or hepatic mass. Pancreas: No pancreatic ductal dilatation. The noncontrast appearance of the pancreas is unremarkable. Spleen: Normal Adrenals/Urinary Tract: Stable normal appearing bilateral adrenal  glands. Hydronephrosis noted bilaterally with faint calcifications in the interpolar aspect of the right kidney again noted. There appears to be a 3 mm calcification in the distal left UVJ, series 2, image 63 which may explain the dilated left renal collecting system. No calculus is seen on the right. The bladder however is markedly distended and vesicoureteral reflux accounting for this appearance may explain this appearance versus prior passage of a stone. A ureteral stricture is not entirely excluded or potentially a urothelial lesion but no conclusive CT findings to suggest such. Stomach/Bowel: No bowel dilatation. Stomach is contracted. There appears to be normal small bowel rotation. A moderate amount of stool is seen within large bowel. There is perirectal inflammation with thickening of rectal walls surrounding a large focus of stool. Findings would be in keeping with proctitis. Vascular/Lymphatic: No significant vascular findings are present. No enlarged abdominal or pelvic lymph nodes. Reproductive: Prostate is unremarkable. Other: No abdominal wall  hernia or abnormality. No abdominopelvic ascites. Musculoskeletal: Mild dextroconvex curvature of the lumbar spine with degenerative disc disease L1 through L3. Grade 1 anterolisthesis of L4 on L5 with L3 through S1 facet arthropathy. IMPRESSION: 1. 3 mm left ureterovesical junction stone causing moderate left-sided hydronephrosis. 2. Hydronephrosis of the right renal collecting system without obstructive uropathy noted. Findings could be related to a recently passed stone. Given the distended appearance of the bladder, this could be a result vesicoureteral reflux alternatively. Other etiologies including stricture or a urothelial lesion are not entirely excluded but believed less likely. 3. Rectal thickening with perirectal inflammation surrounding a large stool ball consistent with proctitis. 4. Lumbar spondylosis with dextroscoliosis. Electronically Signed    By: Tollie Ethavid  Kwon M.D.   On: 12/20/2016 19:42    Scheduled Meds: . allopurinol  100 mg Oral QHS  . bisacodyl  10 mg Rectal Daily  . chlorhexidine  15 mL Mouth Rinse BID  . Chlorhexidine Gluconate Cloth  6 each Topical Daily  . clindamycin  1 application Topical Daily  . memantine  28 mg Oral Daily   And  . donepezil  10 mg Oral Q1500  . famotidine  20 mg Oral QHS  . fluticasone  1 spray Each Nare Daily  . levothyroxine  88 mcg Oral QAC breakfast  . mouth rinse  15 mL Mouth Rinse q12n4p  . mineral oil-hydrophilic petrolatum  1 application Topical BID  . mupirocin ointment  1 application Nasal BID  . sodium chloride flush  3 mL Intravenous Q12H  . tamsulosin  0.4 mg Oral Daily    Continuous Infusions: . sodium chloride 1,000 mL (12/21/16 1000)  . cefTRIAXone (ROCEPHIN)  IV       Time spent: 35mins  Viviene Thurston MD, PhD  [facility] Pager [phone]. If 7PM-7AM, please contact night-coverage at [email or web address], password [email or web address] 12/21/2016, 11:49 AM  LOS: 1 day

## 2016-12-22 DIAGNOSIS — K6289 Other specified diseases of anus and rectum: Secondary | ICD-10-CM

## 2016-12-22 LAB — BASIC METABOLIC PANEL
Anion gap: 7 (ref 5–15)
BUN: 16 mg/dL (ref 6–20)
CALCIUM: 7.7 mg/dL — AB (ref 8.9–10.3)
CO2: 26 mmol/L (ref 22–32)
Chloride: 111 mmol/L (ref 101–111)
Creatinine, Ser: 1.01 mg/dL (ref 0.61–1.24)
Glucose, Bld: 81 mg/dL (ref 65–99)
Potassium: 3.9 mmol/L (ref 3.5–5.1)
SODIUM: 144 mmol/L (ref 135–145)

## 2016-12-22 LAB — PROTIME-INR
INR: 1.38
Prothrombin Time: 17.1 seconds — ABNORMAL HIGH (ref 11.4–15.2)

## 2016-12-22 LAB — CBC WITH DIFFERENTIAL/PLATELET
BASOS PCT: 1 %
Basophils Absolute: 0.1 10*3/uL (ref 0.0–0.1)
EOS ABS: 0 10*3/uL (ref 0.0–0.7)
EOS PCT: 0 %
HCT: 28.6 % — ABNORMAL LOW (ref 39.0–52.0)
Hemoglobin: 9.2 g/dL — ABNORMAL LOW (ref 13.0–17.0)
LYMPHS ABS: 1 10*3/uL (ref 0.7–4.0)
Lymphocytes Relative: 9 %
MCH: 30.7 pg (ref 26.0–34.0)
MCHC: 32.2 g/dL (ref 30.0–36.0)
MCV: 95.3 fL (ref 78.0–100.0)
MONO ABS: 1.7 10*3/uL — AB (ref 0.1–1.0)
MONOS PCT: 16 %
NEUTROS PCT: 74 %
Neutro Abs: 7.6 10*3/uL (ref 1.7–7.7)
PLATELETS: 264 10*3/uL (ref 150–400)
RBC: 3 MIL/uL — ABNORMAL LOW (ref 4.22–5.81)
RDW: 15.9 % — AB (ref 11.5–15.5)
WBC: 10.3 10*3/uL (ref 4.0–10.5)

## 2016-12-22 LAB — MAGNESIUM: Magnesium: 1.6 mg/dL — ABNORMAL LOW (ref 1.7–2.4)

## 2016-12-22 LAB — HEMOGLOBIN A1C
HEMOGLOBIN A1C: 6.1 % — AB (ref 4.8–5.6)
Mean Plasma Glucose: 128 mg/dL

## 2016-12-22 MED ORDER — SODIUM CHLORIDE 0.9 % IV SOLN
1000.0000 mL | INTRAVENOUS | Status: DC
  Administered 2016-12-22 – 2016-12-24 (×3): 1000 mL via INTRAVENOUS

## 2016-12-22 MED ORDER — DEXTROSE 5 % IV SOLN
2.0000 g | INTRAVENOUS | Status: DC
  Administered 2016-12-22 – 2016-12-24 (×3): 2 g via INTRAVENOUS
  Filled 2016-12-22 (×3): qty 2

## 2016-12-22 MED ORDER — LACTULOSE 10 GM/15ML PO SOLN
6.7000 g | Freq: Every day | ORAL | Status: DC
  Administered 2016-12-23 – 2016-12-24 (×2): 6.7 g via ORAL
  Filled 2016-12-22 (×3): qty 15

## 2016-12-22 MED ORDER — MINERAL OIL RE ENEM
1.0000 | ENEMA | Freq: Once | RECTAL | Status: AC
  Administered 2016-12-22: 1 via RECTAL
  Filled 2016-12-22: qty 1

## 2016-12-22 MED ORDER — SENNOSIDES-DOCUSATE SODIUM 8.6-50 MG PO TABS
1.0000 | ORAL_TABLET | Freq: Two times a day (BID) | ORAL | Status: DC
  Administered 2016-12-22 – 2016-12-25 (×6): 1 via ORAL
  Filled 2016-12-22 (×6): qty 1

## 2016-12-22 MED ORDER — MAGNESIUM SULFATE 2 GM/50ML IV SOLN
2.0000 g | Freq: Once | INTRAVENOUS | Status: AC
  Administered 2016-12-22: 2 g via INTRAVENOUS
  Filled 2016-12-22: qty 50

## 2016-12-22 NOTE — Evaluation (Signed)
Clinical/Bedside Swallow Evaluation Patient Details  Name: [name] [name] MRN: [id number] Date of Birth: 1950-09-16  Today's Date: 12/22/2016 Time: SLP Start Time (ACUTE ONLY): 1110 SLP Stop Time (ACUTE ONLY): 1131 SLP Time Calculation (min) (ACUTE ONLY): 21 min  Past Medical History:  Past Medical History:  Diagnosis Date  . Diabetes mellitus without complication (HCC)   . Down syndrome    Past Surgical History: History reviewed. No pertinent surgical history. HPI:  [name] [name] a 66 y.o.malewith a primary medical h/o mental retardation/dementia from a group home, baseline wheelchair bound, total care, is brought to [facility] ED due to more lethargic than normal. Diet at group home puree, thin liquids. ST to evalaute swallow function.   Assessment / Plan / Recommendation Clinical Impression  Pt presents with a cognitive based mild oropharyngeal dysphagia. Cognitive deficits impact coordination for swallow sequencing. Pt with reduced labial seal around cup, frequent tongue protusion motion during PO trials and unable to consistently follow commands. Pt with one instance of delayed overt cough following thin liquids by cup. However, further thin liquid trials without overt s/sx of aspiration. Pts cognitive decline puts him at increased risk for aspiration and episodic swallow difficulty. Solid consistency trials resulted in poor bolus cohesion and impaired mastication. Recommend dysphagia 1 (puree) and thin liquid diet with full supervision to decrease aspiration risk and enhance swallow safety. Recommend medicines crushed in puree. ST to follow up for diet tolerance.  SLP Visit Diagnosis: Dysphagia, oropharyngeal phase (R13.12)    Aspiration Risk  Moderate aspiration risk;Mild aspiration risk    Diet Recommendation   Dysphagia 1 (puree) thin liquids   Medication Administration: Crushed with puree    Other  Recommendations Oral Care Recommendations: Oral care BID   Follow up Recommendations  Other (comment);24 hour supervision/assistance (group home)      Frequency and Duration min 2x/week  1 week       Prognosis Prognosis for Safe Diet Advancement: Good Barriers to Reach Goals: Cognitive deficits      Swallow Study   General Date of Onset: 12/20/16 HPI: [name] [name] a 66 y.o.malewith a primary medical h/o mental retardation/dementia from a group home, baseline wheelchair bound, total care, is brought to [facility] ED due to more lethargic than normal. Diet at group home puree, thin liquids. ST to evalaute swallow function. Type of Study: Bedside Swallow Evaluation Previous Swallow Assessment: none on file Diet Prior to this Study: NPO Temperature Spikes Noted: Yes Respiratory Status: Room air History of Recent Intubation: No Behavior/Cognition: Alert;Distractible;Doesn't follow directions Oral Cavity Assessment: Dry;Dried secretions Oral Care Completed by SLP: Yes Oral Cavity - Dentition: Missing dentition Vision: Impaired for self-feeding Self-Feeding Abilities: Needs assist;Total assist Patient Positioning: Upright in bed Baseline Vocal Quality: Low vocal intensity Volitional Cough: Congested;Cognitively unable to elicit    Oral/Motor/Sensory Function Overall Oral Motor/Sensory Function: Generalized oral weakness   Ice Chips Ice chips: Impaired Presentation: Spoon Oral Phase Impairments: Reduced lingual movement/coordination;Impaired mastication;Reduced labial seal Oral Phase Functional Implications: Prolonged oral transit Pharyngeal Phase Impairments: Suspected delayed Swallow;Multiple swallows   Thin Liquid Thin Liquid: Impaired Presentation: Cup;Spoon Oral Phase Impairments: Reduced labial seal;Reduced lingual movement/coordination Oral Phase Functional Implications: Prolonged oral transit (anterior spillage at midline intermittently ) Pharyngeal  Phase Impairments: Suspected delayed Swallow;Cough - Delayed;Multiple swallows    Nectar Thick Nectar Thick  Liquid: Not tested   Honey Thick Honey Thick Liquid: Not tested   Puree Puree: Within functional limits   Solid   GO   Solid: Impaired Oral  Phase Impairments: Reduced lingual movement/coordination;Impaired mastication;Reduced labial seal Oral Phase Functional Implications: Prolonged oral transit;Impaired mastication;Oral residue Pharyngeal Phase Impairments: Suspected delayed Swallow;Multiple swallows       Marcene Duoshelsea Sumney MA, CCC-SLP Acute Care Speech Language Pathologist    Kennieth RadChelsea E Sumney 12/22/2016,11:41 AM

## 2016-12-22 NOTE — Progress Notes (Signed)
Assessment:  1.  Bilateral hydronephrosis - His creatinine remains stable and low.  2.  UTI - He did have fever last night but his white blood cell count remains normal.  He had >100K pansensitive E. coli on culture and is on appropriate antibiotics.  3.  Right ureteral stone - His stone is 2 mm wide with a very high probability of spontaneous passage.  He has passed stones previously much larger and is on medical expulsive therapy.  I would favor conservative management unless his clinical picture worsens.  Plan: 1.  Continue IV antibiotics. 2.  Although I feel the probability of need for stent placement remains low and I will make him n.p.o. after midnight just in case.   Subjective: Patient appears to be in no apparent distress.  Remains unable to communicate.  Objective: Vital signs in last 24 hours: Temp:  [98.5 F (36.9 C)-102 F (38.9 C)] 99.1 F (37.3 C) (05/05 0519) Pulse Rate:  [59-91] 60 (05/05 0600) Resp:  [9-27] 18 (05/05 0600) BP: (121-165)/(42-105) 136/66 (05/05 0600) SpO2:  [70 %-100 %] 97 % (05/05 0600)  Intake/Output from previous day: 05/04 0701 - 05/05 0700 In: 3050 [I.V.:2750; IV Piggyback:300] Out: 1775 [Urine:1775] Intake/Output this shift: No intake/output data recorded.  Past Medical History:  Diagnosis Date  . Diabetes mellitus without complication (HCC)   . Down syndrome    Current Facility-Administered Medications  Medication Dose Route Frequency Provider Last Rate Last Dose  . 0.9 %  sodium chloride infusion  1,000 mL Intravenous Continuous Albertine Grates, MD 75 mL/hr at 12/22/16 0829 1,000 mL at 12/22/16 0829  . acetaminophen (TYLENOL) suppository 650 mg  650 mg Rectal Q4H PRN Briscoe Deutscher, MD   650 mg at 12/21/16 2034  . albuterol (PROVENTIL) (2.5 MG/3ML) 0.083% nebulizer solution 3 mL  3 mL Inhalation Q6H PRN Albertine Grates, MD      . allopurinol (ZYLOPRIM) tablet 100 mg  100 mg Oral QHS Albertine Grates, MD      . bisacodyl (DULCOLAX) suppository 10 mg   10 mg Rectal Daily PRN Albertine Grates, MD      . bisacodyl (DULCOLAX) suppository 10 mg  10 mg Rectal Daily Albertine Grates, MD      . carbamide peroxide (DEBROX) 6.5 % otic solution 5 drop  5 drop Both Ears BID PRN Albertine Grates, MD      . cefTRIAXone (ROCEPHIN) 2 g in dextrose 5 % 50 mL IVPB  2 g Intravenous Q24H Albertine Grates, MD      . chlorhexidine (PERIDEX) 0.12 % solution 15 mL  15 mL Mouth Rinse BID Albertine Grates, MD   15 mL at 12/21/16 2202  . Chlorhexidine Gluconate Cloth 2 % PADS 6 each  6 each Topical Daily Albertine Grates, MD      . clindamycin (CLINDAGEL) 1 % gel 1 application  1 application Topical Daily Albertine Grates, MD      . diphenhydrAMINE (BENADRYL) 12.5 MG/5ML elixir 25-50 mg  25-50 mg Oral QID PRN Albertine Grates, MD      . memantine (NAMENDA XR) 24 hr capsule 28 mg  28 mg Oral Daily Albertine Grates, MD       And  . donepezil (ARICEPT) tablet 10 mg  10 mg Oral Q1500 Albertine Grates, MD      . famotidine (PEPCID) tablet 20 mg  20 mg Oral Meribeth Mattes, MD   20 mg at 12/20/16 2155  . fluticasone (FLONASE) 50 MCG/ACT nasal spray 1 spray  1 spray Each Nare Daily Albertine Grates, MD   1 spray at 12/21/16 0919  . hydrocortisone cream 1 % 1 application  1 application Topical QID PRN Albertine Grates, MD      . levothyroxine (SYNTHROID, LEVOTHROID) tablet 88 mcg  88 mcg Oral QAC breakfast Albertine Grates, MD   88 mcg at 12/22/16 1610  . magnesium sulfate IVPB 2 g 50 mL  2 g Intravenous Once Albertine Grates, MD 50 mL/hr at 12/22/16 0829 2 g at 12/22/16 0829  . MEDLINE mouth rinse  15 mL Mouth Rinse q12n4p Albertine Grates, MD   15 mL at 12/21/16 1507  . metroNIDAZOLE (FLAGYL) IVPB 500 mg  500 mg Intravenous Janit Bern, MD 100 mL/hr at 12/22/16 0453 500 mg at 12/22/16 0453  . mineral oil-hydrophilic petrolatum (AQUAPHOR) ointment 1 application  1 application Topical BID Albertine Grates, MD   1 application at 12/21/16 2202  . mupirocin ointment (BACTROBAN) 2 % 1 application  1 application Nasal BID Albertine Grates, MD   1 application at 12/21/16 2203  . sodium chloride flush (NS) 0.9 %  injection 3 mL  3 mL Intravenous Q12H Albertine Grates, MD   3 mL at 12/21/16 2202  . tamsulosin (FLOMAX) capsule 0.4 mg  0.4 mg Oral Daily Albertine Grates, MD        Physical Exam:  General: Patient is in no apparent distress Lungs: Normal respiratory effort, chest expands symmetrically. GI: The abdomen is soft and nontender without mass.    Lab Results:  Recent Labs  12/20/16 1054 12/21/16 0409 12/22/16 0320  WBC 7.0 9.1 10.3  HGB 10.3* 8.9* 9.2*  HCT 31.7* 27.9* 28.6*   BMET  Recent Labs  12/21/16 0409 12/22/16 0320  NA 144 144  K 3.9 3.9  CL 110 111  CO2 27 26  GLUCOSE 110* 81  BUN 16 16  CREATININE 1.12 1.01  CALCIUM 8.1* 7.7*    Recent Labs  12/22/16 0320  INR 1.38   No results for input(s): LABURIN in the last 72 hours. Results for orders placed or performed during the hospital encounter of 12/20/16  Blood culture (routine x 2)     Status: None (Preliminary result)   Collection Time: 12/20/16  4:04 PM  Result Value Ref Range Status   Specimen Description BLOOD RIGHT ANTECUBITAL  Final   Special Requests   Final    BOTTLES DRAWN AEROBIC AND ANAEROBIC Blood Culture results may not be optimal due to an inadequate volume of blood received in culture bottles   Culture   Final    NO GROWTH < 24 HOURS Performed at [facility], [address], [place], [place] [address]    Report Status PENDING  Incomplete  Blood culture (routine x 2)     Status: None (Preliminary result)   Collection Time: 12/20/16  4:16 PM  Result Value Ref Range Status   Specimen Description BLOOD RIGHT HAND  Final   Special Requests   Final    BOTTLES DRAWN AEROBIC ONLY Blood Culture adequate volume   Culture   Final    NO GROWTH < 24 HOURS Performed at [facility], [address] [place], [place] [address]    Report Status PENDING  Incomplete  MRSA PCR Screening     Status: Abnormal   Collection Time: 12/20/16  8:22 PM  Result Value Ref Range Status   MRSA by PCR POSITIVE (A)  NEGATIVE Final    Comment:  The GeneXpert MRSA Assay (FDA approved for NASAL specimens only), is one component of a comprehensive MRSA colonization surveillance program. It is not intended to diagnose MRSA infection nor to guide or monitor treatment for MRSA infections. RESULT CALLED TO, READ BACK BY AND VERIFIED WITH: ODOM,S RN 5.3.18 @2232  ZANDO,C     Studies/Results: No results found.     Jamin Panther C 12/22/2016, 8:50 AM

## 2016-12-22 NOTE — Progress Notes (Signed)
PROGRESS NOTE  CRAVEN CREAN ZOX:[id number] DOB: [birth year] DOA: 12/20/2016 PCP: Lucretia Field   Patient not able to provide history due to underline mental retardation,   HPI/Recap of past 24 hours:  Spike fever of 102 overnight, bp and heart rate has been stable, he does not seem in pain, he seems to be more awake, foley in place with clear urine  Assessment/Plan: Active Problems:   Sepsis (HCC)   Sepsis with recurrent uti/hydronephrosis, proctitis:  Due to hypotension he is admitted to stepdown unit,  cxr no acute findings, Blood culture /urine culture pending, ct renal stone study + hydro, + proctitis    keep on rocephin 2gram for now, add flagyl,  home bp meds norvasc held, continue ivf  AKI/ CKD II/III/UVJ stone/bilateral hydronephrosis:  patient's cr wnl in 2017, but cr have been elevated during last hospitalization and this time.  Cr 1.4 on admission, 1.12 on 5/4, likely AKI on ckd II/III  "CT renal stone study:  1. 3 mm left ureterovesical junction stone causing moderate left-sided hydronephrosis. 2. Hydronephrosis of the right renal collecting system without obstructive uropathy noted. Findings could be related to a recently passed stone. Given the distended appearance of the bladder, this could be a result vesicoureteral reflux alternatively. Other etiologies including stricture or a urothelial lesion are not entirely excluded but believed less likely.  Continue abx, hydration, insert foley, urology consulted, case discussed with Dr Vernie Ammons over the phone   Constipation with proctitis: Ct :Rectal thickening with perirectal inflammation surrounding a large stool ball consistent with proctitis. Continue rocephin, add flagyl, stool regimen/enema  Hypomagnesemia: replace mag iv  ?noninsulin dependent diabetes: per chart review, he does not appear to be on any meds for this,  will check a1c for now  Chronic normocytic anemia: hgb 10.3 at  baseline  hypothyroidism: continue home meds synthroid  h/o asthma: no wheezing on exam, cxr no acute findings, he in on room air, continue home meds.  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  RBBB: chronic  Diet:   Dysphagia 1 (puree) thin liquids  Medication Administration: Crushed with puree per speech eval   DVT prophylaxis: lovenox held for possible urological procedure , currently on scd's   Code Status: full  Family Communication: patient   Disposition Plan: transfer to med surg   Consultants:  Urology Dr Vernie Ammons  Procedures:  none  Antibiotics:  Rocephin from admission  Flagyl from 5/4   Objective: BP 136/66   Pulse 60   Temp 99.1 F (37.3 C) (Axillary)   Resp 18   Ht 5' (1.524 m)   Wt 49.2 kg (108 lb 7.5 oz)   SpO2 97%   BMI 21.18 kg/m   Intake/Output Summary (Last 24 hours) at 12/22/16 0758 Last data filed at 12/22/16 0454  Gross per 24 hour  Intake             3050 ml  Output             1775 ml  Net             1275 ml   Filed Weights   12/20/16 1018 12/20/16 2015  Weight: 47.6 kg (105 lb) 49.2 kg (108 lb 7.5 oz)    Exam:   General:  Chronic Developmental abnormalities , does talk with indistinct words, does not follow commands,   Eyes: PERRL  ENT: very dry oral mucosa, poor dentition  Neck: supple, no JVD  Cardiovascular: RRR  Respiratory: diminished, no wheezing  Abdomen: soft, positive bowel sounds, does not seem to be tender  Skin: no rash  Musculoskeletal:  No edema  Psychiatric: not intelligible   Neurologic: not oriented, not follow commands, not interactive    Data Reviewed: Basic Metabolic Panel:  Recent Labs Lab 12/20/16 1054 12/21/16 0409 12/22/16 0320  NA 145 144 144  K 4.1 3.9 3.9  CL 109 110 111  CO2 30 27 26   GLUCOSE 158* 110* 81  BUN 24* 16 16  CREATININE 1.40* 1.12 1.01  CALCIUM 9.0  8.1* 7.7*  MG  --   --  1.6*   Liver Function Tests:  Recent Labs Lab 12/20/16 1054  AST 22  ALT 13*  ALKPHOS 64  BILITOT 0.6  PROT 6.9  ALBUMIN 2.7*   No results for input(s): LIPASE, AMYLASE in the last 168 hours. No results for input(s): AMMONIA in the last 168 hours. CBC:  Recent Labs Lab 12/20/16 1054 12/21/16 0409 12/22/16 0320  WBC 7.0 9.1 10.3  NEUTROABS 5.0  --  7.6  HGB 10.3* 8.9* 9.2*  HCT 31.7* 27.9* 28.6*  MCV 96.1 95.9 95.3  PLT 282 289 264   Cardiac Enzymes:   No results for input(s): CKTOTAL, CKMB, CKMBINDEX, TROPONINI in the last 168 hours. BNP (last 3 results) No results for input(s): BNP in the last 8760 hours.  ProBNP (last 3 results) No results for input(s): PROBNP in the last 8760 hours.  CBG: No results for input(s): GLUCAP in the last 168 hours.  Recent Results (from the past 240 hour(s))  Blood culture (routine x 2)     Status: None (Preliminary result)   Collection Time: 12/20/16  4:04 PM  Result Value Ref Range Status   Specimen Description BLOOD RIGHT ANTECUBITAL  Final   Special Requests   Final    BOTTLES DRAWN AEROBIC AND ANAEROBIC Blood Culture results may not be optimal due to an inadequate volume of blood received in culture bottles   Culture   Final    NO GROWTH < 24 HOURS Performed at [facility], [address] [place], [place] [address]    Report Status PENDING  Incomplete  Blood culture (routine x 2)     Status: None (Preliminary result)   Collection Time: 12/20/16  4:16 PM  Result Value Ref Range Status   Specimen Description BLOOD RIGHT HAND  Final   Special Requests   Final    BOTTLES DRAWN AEROBIC ONLY Blood Culture adequate volume   Culture   Final    NO GROWTH < 24 HOURS Performed at [facility], [address] [place], [place] [address]    Report Status PENDING  Incomplete  MRSA PCR Screening     Status: Abnormal   Collection Time: 12/20/16  8:22 PM  Result Value Ref Range Status   MRSA  by PCR POSITIVE (A) NEGATIVE Final    Comment:        The GeneXpert MRSA Assay (FDA approved for NASAL specimens only), is one component of a comprehensive MRSA colonization surveillance program. It is not intended to diagnose MRSA infection nor to guide or monitor treatment for MRSA infections. RESULT CALLED TO, READ BACK BY AND VERIFIED WITH: ODOM,S RN 5.3.18 @2232  ZANDO,C      Studies: No results found.  Scheduled Meds: . allopurinol  100 mg Oral QHS  . bisacodyl  10 mg Rectal Daily  . chlorhexidine  15 mL Mouth Rinse BID  . Chlorhexidine Gluconate Cloth  6 each Topical Daily  . clindamycin  1 application Topical Daily  . memantine  28 mg Oral Daily   And  . donepezil  10 mg Oral Q1500  . famotidine  20 mg Oral QHS  . fluticasone  1 spray Each Nare Daily  . levothyroxine  88 mcg Oral QAC breakfast  . mouth rinse  15 mL Mouth Rinse q12n4p  . mineral oil  1 enema Rectal Once  . mineral oil-hydrophilic petrolatum  1 application Topical BID  . mupirocin ointment  1 application Nasal BID  . sodium chloride flush  3 mL Intravenous Q12H  . tamsulosin  0.4 mg Oral Daily    Continuous Infusions: . sodium chloride 1,000 mL (12/22/16 0400)  . cefTRIAXone (ROCEPHIN)  IV    . magnesium sulfate 1 - 4 g bolus IVPB    . metronidazole 500 mg (12/22/16 0453)     Time spent:  Montrez Marietta MD, PhD  [facility] Pager [phone]. If 7PM-7AM, please contact night-coverage at [email or web address], password [facility] 12/22/2016, 7:58 AM  LOS: 2 days

## 2016-12-23 ENCOUNTER — Inpatient Hospital Stay (HOSPITAL_COMMUNITY): Payer: Medicare Other

## 2016-12-23 DIAGNOSIS — N3001 Acute cystitis with hematuria: Secondary | ICD-10-CM

## 2016-12-23 LAB — BASIC METABOLIC PANEL
Anion gap: 4 — ABNORMAL LOW (ref 5–15)
BUN: 15 mg/dL (ref 6–20)
CALCIUM: 7.4 mg/dL — AB (ref 8.9–10.3)
CO2: 26 mmol/L (ref 22–32)
CREATININE: 0.91 mg/dL (ref 0.61–1.24)
Chloride: 112 mmol/L — ABNORMAL HIGH (ref 101–111)
GFR calc non Af Amer: >60 mL/min (ref >60)
GLUCOSE: 109 mg/dL — AB (ref 65–99)
Potassium: 3.4 mmol/L — ABNORMAL LOW (ref 3.5–5.1)
Sodium: 142 mmol/L (ref 135–145)

## 2016-12-23 LAB — CBC
HEMATOCRIT: 25.5 % — AB (ref 39.0–52.0)
Hemoglobin: 8.4 g/dL — ABNORMAL LOW (ref 13.0–17.0)
MCH: 30.9 pg (ref 26.0–34.0)
MCHC: 32.9 g/dL (ref 30.0–36.0)
MCV: 93.8 fL (ref 78.0–100.0)
Platelets: 266 10*3/uL (ref 150–400)
RBC: 2.72 MIL/uL — ABNORMAL LOW (ref 4.22–5.81)
RDW: 15.7 % — AB (ref 11.5–15.5)
WBC: 6.9 10*3/uL (ref 4.0–10.5)

## 2016-12-23 LAB — URINE CULTURE: Culture: >=100000 — AB

## 2016-12-23 LAB — MAGNESIUM: Magnesium: 2 mg/dL (ref 1.7–2.4)

## 2016-12-23 MED ORDER — ENOXAPARIN SODIUM 40 MG/0.4ML ~~LOC~~ SOLN
40.0000 mg | SUBCUTANEOUS | Status: DC
  Administered 2016-12-23 – 2016-12-24 (×2): 40 mg via SUBCUTANEOUS
  Filled 2016-12-23 (×2): qty 0.4

## 2016-12-23 MED ORDER — WHITE PETROLATUM GEL
Freq: Two times a day (BID) | Status: DC
  Administered 2016-12-23 – 2016-12-24 (×4): via TOPICAL
  Filled 2016-12-23 (×3): qty 28.35

## 2016-12-23 NOTE — Progress Notes (Signed)
Assessment: 1.  Bilateral hydronephrosis - His creatinine remains stable and low currently at 0.91. It would appear his hydronephrosis is not causing any significant renal insufficiency. I will reassess his degree of hydronephrosis with a renal ultrasound.  2.  UTI - He has continued to remain afebrile over the past 24 hours now and his white blood cell count has returned completely normal at 0.69. There does not appear to be any need for surgical intervention. He has a Foley catheter now. I will reassess the need for this after his renal ultrasound.  3.  Right ureteral stone - His 2 mm stone will likely pass spontaneously if it has not already.   Plan: 1. Continue medical expulsive therapy for now. 2. He does not need to be NPO and can have a diet. 3. Continue Foley for now. 4. He will be scheduled for renal ultrasound.    Subjective: Patient initially asleep when seen. Appears to be in no apparent distress.  Objective: Vital signs in last 24 hours: Temp:  [97.7 F (36.5 C)-99.9 F (37.7 C)] 99.9 F (37.7 C) (05/06 0609) Pulse Rate:  [62-75] 70 (05/06 0609) Resp:  [13-18] 16 (05/06 0609) BP: (93-129)/(40-70) 110/64 (05/06 0640) SpO2:  [96 %-100 %] 96 % (05/06 0609)A  Intake/Output from previous day: 05/05 0701 - 05/06 0700 In: 2056.3 [P.O.:130; I.V.:1576.3; IV Piggyback:350] Out: 1750 [Urine:1750] Intake/Output this shift: No intake/output data recorded.  Past Medical History:  Diagnosis Date  . Diabetes mellitus without complication (HCC)   . Down syndrome     Physical Exam:  Lungs - Normal respiratory effort, chest expands symmetrically.  Abdomen - Soft, non-tender & non-distended.  Lab Results:  Recent Labs  12/21/16 0409 12/22/16 0320 12/23/16 0535  WBC 9.1 10.3 6.9  HGB 8.9* 9.2* 8.4*  HCT 27.9* 28.6* 25.5*   BMET  Recent Labs  12/22/16 0320 12/23/16 0535  NA 144 142  K 3.9 3.4*  CL 111 112*  CO2 26 26  GLUCOSE 81 109*  BUN 16 15   CREATININE 1.01 0.91  CALCIUM 7.7* 7.4*   No results for input(s): LABURIN in the last 72 hours. Results for orders placed or performed during the hospital encounter of 12/20/16  Urine culture     Status: Abnormal   Collection Time: 12/20/16  1:00 PM  Result Value Ref Range Status   Specimen Description URINE, CATHETERIZED  Final   Special Requests NONE  Final   Culture >=100,000 COLONIES/mL ESCHERICHIA COLI (A)  Final   Report Status 12/23/2016 FINAL  Final   Organism ID, Bacteria ESCHERICHIA COLI (A)  Final      Susceptibility   Escherichia coli - MIC*    AMPICILLIN 8 SENSITIVE Sensitive     CEFAZOLIN <=4 SENSITIVE Sensitive     CEFTRIAXONE <=1 SENSITIVE Sensitive     CIPROFLOXACIN <=0.25 SENSITIVE Sensitive     GENTAMICIN <=1 SENSITIVE Sensitive     IMIPENEM <=0.25 SENSITIVE Sensitive     NITROFURANTOIN <=16 SENSITIVE Sensitive     TRIMETH/SULFA <=20 SENSITIVE Sensitive     AMPICILLIN/SULBACTAM 4 SENSITIVE Sensitive     PIP/TAZO <=4 SENSITIVE Sensitive     Extended ESBL NEGATIVE Sensitive     * >=100,000 COLONIES/mL ESCHERICHIA COLI  Blood culture (routine x 2)     Status: None (Preliminary result)   Collection Time: 12/20/16  4:04 PM  Result Value Ref Range Status   Specimen Description BLOOD RIGHT ANTECUBITAL  Final   Special Requests   Final  BOTTLES DRAWN AEROBIC AND ANAEROBIC Blood Culture results may not be optimal due to an inadequate volume of blood received in culture bottles   Culture   Final    NO GROWTH 2 DAYS Performed at [facility], [address], [place], [place] [address]    Report Status PENDING  Incomplete  Blood culture (routine x 2)     Status: None (Preliminary result)   Collection Time: 12/20/16  4:16 PM  Result Value Ref Range Status   Specimen Description BLOOD RIGHT HAND  Final   Special Requests   Final    BOTTLES DRAWN AEROBIC ONLY Blood Culture adequate volume   Culture   Final    NO GROWTH 2 DAYS Performed at [facility], [address] [place], [place] [address]    Report Status PENDING  Incomplete  MRSA PCR Screening     Status: Abnormal   Collection Time: 12/20/16  8:22 PM  Result Value Ref Range Status   MRSA by PCR POSITIVE (A) NEGATIVE Final    Comment:        The GeneXpert MRSA Assay (FDA approved for NASAL specimens only), is one component of a comprehensive MRSA colonization surveillance program. It is not intended to diagnose MRSA infection nor to guide or monitor treatment for MRSA infections. RESULT CALLED TO, READ BACK BY AND VERIFIED WITH: ODOM,S RN 5.3.18 @2232  ZANDO,C     Studies/Results: No results found.    Isabel Ardila C 12/23/2016, 8:37 AM

## 2016-12-23 NOTE — Progress Notes (Signed)
PROGRESS NOTE  [name] [name] VQQ:[id number] DOB: 02-26-1951 DOA: 12/20/2016 PCP: Lucretia Field   Patient not able to provide history due to underline mental retardation,   HPI/Recap of past 24 hours:  fever coming down, t max 99.9, bp and heart rate has been stable, he does not seem in pain, he seems to be more awake, foley in place with clear urine  Assessment/Plan: Active Problems:   Sepsis (HCC)   Sepsis with recurrent uti/hydronephrosis, proctitis:  Due to hypotension he is admitted to stepdown unit,  cxr no acute findings, Blood culture /urine culture pending, ct renal stone study + hydro, + proctitis    keep on rocephin 2gram for now, add flagyl,  home bp meds norvasc held, continue ivf  AKI/ CKD II/III/UVJ stone/bilateral hydronephrosis:  patient's cr wnl in 2017, but cr have been elevated during last hospitalization and this time.  Cr 1.4 on admission, 1.12 on 5/4, likely AKI on ckd II/III  "CT renal stone study:  1. 3 mm left ureterovesical junction stone causing moderate left-sided hydronephrosis. 2. Hydronephrosis of the right renal collecting system without obstructive uropathy noted. Findings could be related to a recently passed stone. Given the distended appearance of the bladder, this could be a result vesicoureteral reflux alternatively. Other etiologies including stricture or a urothelial lesion are not entirely excluded but believed less likely.  Continue abx, hydration, insert foley, urology consulted, case discussed with Dr Vernie Ammons over the phone   Constipation with proctitis: Ct :Rectal thickening with perirectal inflammation surrounding a large stool ball consistent with proctitis. Continue rocephin, add flagyl, stool regimen/enema  Hypomagnesemia: replace mag iv  ?noninsulin dependent diabetes: per chart review, he does not appear to be on any meds for this,  will check a1c for now  Chronic normocytic anemia: hgb 10.3 at  baseline  hypothyroidism: continue home meds synthroid  h/o asthma: no wheezing on exam, cxr no acute findings, he in on room air, continue home meds.  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  RBBB: chronic  Diet:   Dysphagia 1 (puree) thin liquids  Medication Administration: Crushed with puree per speech eval   DVT prophylaxis: lovenox held for possible urological procedure , currently on scd's   Code Status: full  Family Communication: patient   Disposition Plan: return to group home with urology clearance   Consultants:  Urology Dr Vernie Ammons  Procedures:  none  Antibiotics:  Rocephin from admission  Flagyl from 5/4   Objective: BP 119/73 (BP Location: Left Leg)   Pulse 80   Temp 97.8 F (36.6 C) (Axillary)   Resp 18   Ht 5' (1.524 m)   Wt 49.2 kg (108 lb 7.5 oz)   SpO2 98%   BMI 21.18 kg/m   Intake/Output Summary (Last 24 hours) at 12/23/16 1547 Last data filed at 12/23/16 1213  Gross per 24 hour  Intake          1668.75 ml  Output             1950 ml  Net          -281.25 ml   Filed Weights   12/20/16 1018 12/20/16 2015  Weight: 47.6 kg (105 lb) 49.2 kg (108 lb 7.5 oz)    Exam:   General:  Chronic Developmental abnormalities , does talk with indistinct words, does not follow commands,   Eyes: PERRL  ENT: very dry oral mucosa, poor dentition  Neck: supple, no JVD  Cardiovascular: RRR  Respiratory: diminished, no wheezing  Abdomen: soft, positive bowel sounds, does not seem to be tender  Skin: no rash  Musculoskeletal:  No edema  Psychiatric: not intelligible   Neurologic: not oriented, not follow commands, not interactive    Data Reviewed: Basic Metabolic Panel:  Recent Labs Lab 12/20/16 1054 12/21/16 0409 12/22/16 0320 12/23/16 0535  NA 145 144 144 142  K 4.1 3.9 3.9 3.4*  CL 109 110 111 112*  CO2 30 27 26 26    GLUCOSE 158* 110* 81 109*  BUN 24* 16 16 15   CREATININE 1.40* 1.12 1.01 0.91  CALCIUM 9.0 8.1* 7.7* 7.4*  MG  --   --  1.6* 2.0   Liver Function Tests:  Recent Labs Lab 12/20/16 1054  AST 22  ALT 13*  ALKPHOS 64  BILITOT 0.6  PROT 6.9  ALBUMIN 2.7*   No results for input(s): LIPASE, AMYLASE in the last 168 hours. No results for input(s): AMMONIA in the last 168 hours. CBC:  Recent Labs Lab 12/20/16 1054 12/21/16 0409 12/22/16 0320 12/23/16 0535  WBC 7.0 9.1 10.3 6.9  NEUTROABS 5.0  --  7.6  --   HGB 10.3* 8.9* 9.2* 8.4*  HCT 31.7* 27.9* 28.6* 25.5*  MCV 96.1 95.9 95.3 93.8  PLT 282 289 264 266   Cardiac Enzymes:   No results for input(s): CKTOTAL, CKMB, CKMBINDEX, TROPONINI in the last 168 hours. BNP (last 3 results) No results for input(s): BNP in the last 8760 hours.  ProBNP (last 3 results) No results for input(s): PROBNP in the last 8760 hours.  CBG: No results for input(s): GLUCAP in the last 168 hours.  Recent Results (from the past 240 hour(s))  Urine culture     Status: Abnormal   Collection Time: 12/20/16  1:00 PM  Result Value Ref Range Status   Specimen Description URINE, CATHETERIZED  Final   Special Requests NONE  Final   Culture >=100,000 COLONIES/mL ESCHERICHIA COLI (A)  Final   Report Status 12/23/2016 FINAL  Final   Organism ID, Bacteria ESCHERICHIA COLI (A)  Final      Susceptibility   Escherichia coli - MIC*    AMPICILLIN 8 SENSITIVE Sensitive     CEFAZOLIN <=4 SENSITIVE Sensitive     CEFTRIAXONE <=1 SENSITIVE Sensitive     CIPROFLOXACIN <=0.25 SENSITIVE Sensitive     GENTAMICIN <=1 SENSITIVE Sensitive     IMIPENEM <=0.25 SENSITIVE Sensitive     NITROFURANTOIN <=16 SENSITIVE Sensitive     TRIMETH/SULFA <=20 SENSITIVE Sensitive     AMPICILLIN/SULBACTAM 4 SENSITIVE Sensitive     PIP/TAZO <=4 SENSITIVE Sensitive     Extended ESBL NEGATIVE Sensitive     * >=100,000 COLONIES/mL ESCHERICHIA COLI  Blood culture (routine x 2)      Status: None (Preliminary result)   Collection Time: 12/20/16  4:04 PM  Result Value Ref Range Status   Specimen Description BLOOD RIGHT ANTECUBITAL  Final   Special Requests   Final    BOTTLES DRAWN AEROBIC AND ANAEROBIC Blood Culture results may not be optimal due to an inadequate volume of blood received in culture bottles   Culture   Final    NO GROWTH 3 DAYS Performed at [facility], [address], [place], [place] [address]    Report Status PENDING  Incomplete  Blood culture (routine x 2)     Status: None (Preliminary result)   Collection Time: 12/20/16  4:16 PM  Result Value Ref Range Status   Specimen Description BLOOD RIGHT HAND  Final   Special Requests   Final    BOTTLES DRAWN AEROBIC ONLY Blood Culture adequate volume   Culture   Final    NO GROWTH 3 DAYS Performed at [facility], [address] [place], [place] [id number]    Report Status PENDING  Incomplete  MRSA PCR Screening     Status: Abnormal   Collection Time: 12/20/16  8:22 PM  Result Value Ref Range Status   MRSA by PCR POSITIVE (A) NEGATIVE Final    Comment:        The GeneXpert MRSA Assay (FDA approved for NASAL specimens only), is one component of a comprehensive MRSA colonization surveillance program. It is not intended to diagnose MRSA infection nor to guide or monitor treatment for MRSA infections. RESULT CALLED TO, READ BACK BY AND VERIFIED WITH: ODOM,S RN 5.3.18 @2232  ZANDO,C      Studies: [place] Abdomen Complete  Result Date: 12/23/2016 CLINICAL DATA:  66 year old male with bilateral hydronephrosis. 3 mm left UVJ calculus. Right side obstructing etiology not evident on recent CT. EXAM: ABDOMEN ULTRASOUND COMPLETE COMPARISON:  CT Abdomen and Pelvis without contrast 12/20/2016 and earlier FINDINGS: Gallbladder: Surgically absent. Common bile duct: Diameter: 6 mm, within normal limits Liver: No focal lesion identified. Within normal limits in parenchymal echogenicity. IVC: No  abnormality visualized. Pancreas: Visualized portion unremarkable. Spleen: Size and appearance within normal limits. Right Kidney: Length: 8.5 cm. No evidence of right hydronephrosis today, renal sinus fat is visible. Renal cortical volume loss noted. Left Kidney: Length: 9.7 cm. Mild left hydronephrosis, mildly improved from recent CT. Less pronounced renal cortical volume loss. Abdominal aorta: No aneurysm visualized. Other findings: None. IMPRESSION: 1. Resolved right side and regressed left side hydronephrosis since 12/20/2016. 2. Otherwise negative ultrasound appearance of the abdomen. Electronically Signed   By: Odessa FlemingH  Hall M.D.   On: 12/23/2016 15:13    Scheduled Meds: . allopurinol  100 mg Oral QHS  . chlorhexidine  15 mL Mouth Rinse BID  . Chlorhexidine Gluconate Cloth  6 each Topical Daily  . clindamycin  1 application Topical Daily  . memantine  28 mg Oral Daily   And  . donepezil  10 mg Oral Q1500  . enoxaparin (LOVENOX) injection  40 mg Subcutaneous Q24H  . famotidine  20 mg Oral QHS  . fluticasone  1 spray Each Nare Daily  . lactulose  6.7 g Oral Daily  . levothyroxine  88 mcg Oral QAC breakfast  . mouth rinse  15 mL Mouth Rinse q12n4p  . mupirocin ointment  1 application Nasal BID  . senna-docusate  1 tablet Oral BID  . sodium chloride flush  3 mL Intravenous Q12H  . tamsulosin  0.4 mg Oral Daily  . white petrolatum   Topical BID    Continuous Infusions: . sodium chloride 1,000 mL (12/22/16 2215)  . cefTRIAXone (ROCEPHIN)  IV 2 g (12/23/16 1005)  . metronidazole Stopped (12/23/16 0740)     Time spent: 25mins  Davita Sublett MD, PhD  [facility] Pager [phone]. If 7PM-7AM, please contact night-coverage at [email or web address], password [email or web address] 12/23/2016, 3:47 PM  LOS: 3 days

## 2016-12-24 ENCOUNTER — Inpatient Hospital Stay (HOSPITAL_COMMUNITY): Payer: Medicare Other

## 2016-12-24 DIAGNOSIS — E876 Hypokalemia: Secondary | ICD-10-CM

## 2016-12-24 LAB — BASIC METABOLIC PANEL
ANION GAP: 6 (ref 5–15)
BUN: 13 mg/dL (ref 6–20)
CHLORIDE: 113 mmol/L — AB (ref 101–111)
CO2: 25 mmol/L (ref 22–32)
Calcium: 7.4 mg/dL — ABNORMAL LOW (ref 8.9–10.3)
Creatinine, Ser: 0.9 mg/dL (ref 0.61–1.24)
GFR calc non Af Amer: >60 mL/min (ref >60)
Glucose, Bld: 119 mg/dL — ABNORMAL HIGH (ref 65–99)
POTASSIUM: 3.3 mmol/L — AB (ref 3.5–5.1)
Sodium: 144 mmol/L (ref 135–145)

## 2016-12-24 LAB — CBC
HEMATOCRIT: 24.4 % — AB (ref 39.0–52.0)
HEMOGLOBIN: 8.1 g/dL — AB (ref 13.0–17.0)
MCH: 31.2 pg (ref 26.0–34.0)
MCHC: 33.2 g/dL (ref 30.0–36.0)
MCV: 93.8 fL (ref 78.0–100.0)
Platelets: 273 10*3/uL (ref 150–400)
RBC: 2.6 MIL/uL — ABNORMAL LOW (ref 4.22–5.81)
RDW: 15.9 % — ABNORMAL HIGH (ref 11.5–15.5)
WBC: 5.6 10*3/uL (ref 4.0–10.5)

## 2016-12-24 MED ORDER — FUROSEMIDE 10 MG/ML IJ SOLN
40.0000 mg | Freq: Once | INTRAMUSCULAR | Status: AC
  Administered 2016-12-24: 40 mg via INTRAVENOUS
  Filled 2016-12-24: qty 4

## 2016-12-24 MED ORDER — POTASSIUM CHLORIDE CRYS ER 20 MEQ PO TBCR
40.0000 meq | EXTENDED_RELEASE_TABLET | Freq: Once | ORAL | Status: DC
  Filled 2016-12-24: qty 2

## 2016-12-24 MED ORDER — DONEPEZIL HCL 10 MG PO TABS
10.0000 mg | ORAL_TABLET | Freq: Every day | ORAL | Status: DC
  Administered 2016-12-24: 10 mg via ORAL
  Filled 2016-12-24: qty 1

## 2016-12-24 MED ORDER — AMOXICILLIN-POT CLAVULANATE 875-125 MG PO TABS
1.0000 | ORAL_TABLET | Freq: Two times a day (BID) | ORAL | Status: DC
  Administered 2016-12-24 – 2016-12-25 (×3): 1 via ORAL
  Filled 2016-12-24 (×3): qty 1

## 2016-12-24 MED ORDER — MEMANTINE HCL 10 MG PO TABS
10.0000 mg | ORAL_TABLET | Freq: Two times a day (BID) | ORAL | Status: DC
  Administered 2016-12-24 – 2016-12-25 (×3): 10 mg via ORAL
  Filled 2016-12-24 (×3): qty 1

## 2016-12-24 NOTE — Progress Notes (Signed)
Date:  Dec 24, 2016  Chart reviewed for concurrent status and case management needs.  Will continue to follow patient progress. Iv abx, iv flds, temp-101.8 Discharge Planning: following for needs  Expected discharge date: [id number]  Marcelle SmilingRhonda Nico Rogness, BSN, [place]RN3, [place]M   [phone]

## 2016-12-24 NOTE — Progress Notes (Addendum)
Assessment: 1. Bilateral hydronephrosis - His creatinine remains stable and low currently at 0.90.  There appears to be no evidence of renal insufficiency.  He has had a Foley catheter indwelling and therefore I will recheck a renal ultrasound today to evaluate his history of hydronephrosis bilaterally.  2. UTI - His urine culture on 12/21/15 was positive for E. coli that was pansensitive.  He is currently on appropriate antibiotics (Rocephin).  His white blood cell count continues to fall and has been normal for several days currently at 5.6.  He did have fever again last night.  With no elevation of his white count or other symptoms to suggest infection I wonder if this could be possibly a drug fever.  3. Right ureteral stone - His 2 mm stone will likely pass spontaneously if it has not already.   Plan: 1.  Continue Foley catheter. 2.  He will remain on Rocephin for now. 3.  Renal ultrasound today.  Addendum: He underwent a renal ultrasound and it revealed resolution of his right-sided hydronephrosis which is consistent with reflux as the cause of his hydronephrosis on that side. There has been improvement of the left-sided hydronephrosis as well indicating that he almost certainly has passed a stone although I will check a KUB just to be sure no stone is visible.    Subjective: Patient unable to give any history.  Objective: Vital signs in last 24 hours: Temp:  [97.8 F (36.6 C)-101.8 F (38.8 C)] 99.2 F (37.3 C) (05/07 0440) Pulse Rate:  [68-80] 68 (05/07 0440) Resp:  [18-20] 20 (05/07 0440) BP: (94-170)/(50-96) 120/72 (05/07 0525) SpO2:  [96 %-98 %] 98 % (05/07 0440)A  Intake/Output from previous day: 05/06 0701 - 05/07 0700 In: 2687.5 [P.O.:540; I.V.:1897.5; IV Piggyback:250] Out: 1125 [Urine:1125] Intake/Output this shift: No intake/output data recorded.  Past Medical History:  Diagnosis Date  . Diabetes mellitus without complication (HCC)   . Down syndrome      Physical Exam:  Lungs - Normal respiratory effort, chest expands symmetrically.  Abdomen - Soft, non-tender & non-distended.  Lab Results:  Recent Labs  12/22/16 0320 12/23/16 0535 12/24/16 0543  WBC 10.3 6.9 5.6  HGB 9.2* 8.4* 8.1*  HCT 28.6* 25.5* 24.4*   BMET  Recent Labs  12/23/16 0535 12/24/16 0543  NA 142 144  K 3.4* 3.3*  CL 112* 113*  CO2 26 25  GLUCOSE 109* 119*  BUN 15 13  CREATININE 0.91 0.90  CALCIUM 7.4* 7.4*   No results for input(s): LABURIN in the last 72 hours. Results for orders placed or performed during the hospital encounter of 12/20/16  Urine culture     Status: Abnormal   Collection Time: 12/20/16  1:00 PM  Result Value Ref Range Status   Specimen Description URINE, CATHETERIZED  Final   Special Requests NONE  Final   Culture >=100,000 COLONIES/mL ESCHERICHIA COLI (A)  Final   Report Status 12/23/2016 FINAL  Final   Organism ID, Bacteria ESCHERICHIA COLI (A)  Final      Susceptibility   Escherichia coli - MIC*    AMPICILLIN 8 SENSITIVE Sensitive     CEFAZOLIN <=4 SENSITIVE Sensitive     CEFTRIAXONE <=1 SENSITIVE Sensitive     CIPROFLOXACIN <=0.25 SENSITIVE Sensitive     GENTAMICIN <=1 SENSITIVE Sensitive     IMIPENEM <=0.25 SENSITIVE Sensitive     NITROFURANTOIN <=16 SENSITIVE Sensitive     TRIMETH/SULFA <=20 SENSITIVE Sensitive     AMPICILLIN/SULBACTAM 4 SENSITIVE Sensitive  PIP/TAZO <=4 SENSITIVE Sensitive     Extended ESBL NEGATIVE Sensitive     * >=100,000 COLONIES/mL ESCHERICHIA COLI  Blood culture (routine x 2)     Status: None (Preliminary result)   Collection Time: 12/20/16  4:04 PM  Result Value Ref Range Status   Specimen Description BLOOD RIGHT ANTECUBITAL  Final   Special Requests   Final    BOTTLES DRAWN AEROBIC AND ANAEROBIC Blood Culture results may not be optimal due to an inadequate volume of blood received in culture bottles   Culture   Final    NO GROWTH 3 DAYS Performed at [facility], [address] [place], [place] [address]    Report Status PENDING  Incomplete  Blood culture (routine x 2)     Status: None (Preliminary result)   Collection Time: 12/20/16  4:16 PM  Result Value Ref Range Status   Specimen Description BLOOD RIGHT HAND  Final   Special Requests   Final    BOTTLES DRAWN AEROBIC ONLY Blood Culture adequate volume   Culture   Final    NO GROWTH 3 DAYS Performed at [facility], [address] [place], [place] [address]    Report Status PENDING  Incomplete  MRSA PCR Screening     Status: Abnormal   Collection Time: 12/20/16  8:22 PM  Result Value Ref Range Status   MRSA by PCR POSITIVE (A) NEGATIVE Final    Comment:        The GeneXpert MRSA Assay (FDA approved for NASAL specimens only), is one component of a comprehensive MRSA colonization surveillance program. It is not intended to diagnose MRSA infection nor to guide or monitor treatment for MRSA infections. RESULT CALLED TO, READ BACK BY AND VERIFIED WITH: ODOM,S RN 5.3.18 @2232  ZANDO,C     Studies/Results: US Abdomen Complete  Result Date: 12/23/2016 CLINICAL DATA:  66 year old male with bilateral hydronephrosis. 3 mm left UVJ calculus. Right side obstructing etiology not evident on recent CT. EXAM: ABDOMEN ULTRASOUND COMPLETE COMPARISON:  CT Abdomen and Pelvis without contrast 12/20/2016 and earlier FINDINGS: Gallbladder: Surgically absent. Common bile duct: Diameter: 6 mm, within normal limits Liver: No focal lesion identified. Within normal limits in parenchymal echogenicity. IVC: No abnormality visualized. Pancreas: Visualized portion unremarkable. Spleen: Size and appearance within normal limits. Right Kidney: Length: 8.5 cm. No evidence of right hydronephrosis today, renal sinus fat is visible. Renal cortical volume loss noted. Left Kidney: Length: 9.7 cm. Mild left hydronephrosis, mildly improved from recent CT. Less pronounced renal cortical volume loss. Abdominal aorta: No aneurysm  visualized. Other findings: None. IMPRESSION: 1. Resolved right side and regressed left side hydronephrosis since 12/20/2016. 2. Otherwise negative ultrasound appearance of the abdomen. Electronically Signed   By: Odessa Fleming M.D.   On: 12/23/2016 15:13      Akaila Rambo C 12/24/2016, 7:08 AM

## 2016-12-24 NOTE — Progress Notes (Signed)
  Speech Language Pathology Treatment: Dysphagia  Patient Details Name: [name] LiasJohn W [name] MRN: [id number] DOB: 06-Dec-1950 Today's Date: 12/24/2016 Time: [id number] SLP Time Calculation (min) (ACUTE ONLY): 14 min  Assessment / Plan / Recommendation Clinical Impression  Pt today willing to accept minimal intake of juice (thin and nectar) via tsp and applesauce before he declined further po stating "leave me alone".  [facility], pt demonstrated cough x1 that was congested.   Pt did not seal lips on cup/straw and likely has delayed oral transiting/swallowing which can lead to episodic aspiration.  He was willing to accept liquids via tsp = cough x2/8 boluses noted post=swallow- can not rule out aspiration related.  Pt's dysphagia consistent with oral deficits with developmental disability.  Recommend continue diet with downgrade of liquids via tsp and strict precautions.   Given fevers - and pharyngeal swallow dysfunction not being able to be diagnosed clinically, pt may benefit from MBS to allow instrumental testing and assure least restrictive diet in place.  MD please order MBS for 12/25/16 if you agree.  Thanks.   HPI HPI: [name] [name] W Iveyis a 66 y.o.malewith a primary medical h/o mental retardation/dementia from a group home, baseline wheelchair bound, total care, [facility] to more lethargic than normal. Diet at group home puree, thin liquids. ST to evalaute swallow function.      SLP Plan          Recommendations  Diet recommendations: Dysphagia 1 (puree);Thin liquid (via tsp only ) Medication Administration: Crushed with puree Supervision: Full supervision/cueing for compensatory strategies Compensations: Slow rate;Small sips/bites;Other (Comment) (delayed swallow, assure pt swallows before giving more)                Oral Care Recommendations: Oral care BID Follow up Recommendations: Other (comment);24 hour supervision/assistance (group  home) SLP Visit Diagnosis: Dysphagia, oropharyngeal phase (R13.12)       GO                Mickie BailKimball, Daishaun Ayre Ann Leland Staszewski RedmondKimball, MS [facility] SLP [phone]

## 2016-12-24 NOTE — Progress Notes (Signed)
PROGRESS NOTE  [name] [name] VQQ:[id number] DOB: May 14, 1951 DOA: 12/20/2016 PCP: Lucretia Field   Patient not able to provide history due to underline mental retardation,   HPI/Recap of past 24 hours:  spike fever again at 101.8  bp and heart rate has been stable, he does not seem in pain, he seems to be more awake, foley in place with clear urine No diarrhea, no cough, no skin issues  Assessment/Plan: Active Problems:   Sepsis (HCC)   Sepsis with recurrent uti/hydronephrosis, proctitis:  Due to hypotension he is admitted to stepdown unit,  cxr no acute findings, Blood culture /urine culture pending, ct renal stone study + hydro, + proctitis   he received rocephin 2gram since admission, flagyl added due to concerns of proctitis   home bp meds norvasc held, he received ivf which is stopped on 5/7. Leukocytosis/cr/hydronephrosis resolved, but keep spiking fever, will get ct chest to r/o aspiration pneumonia, though no cough, no hypoxia,  -no diarrhea, no skin issues  AKI/ CKD II/III/UVJ stone/bilateral hydronephrosis:  patient's cr wnl in 2017, but cr have been elevated during last hospitalization and this time.  Cr 1.4 on admission, 0.9 on 5/7, likely AKI on ckd II/III  "CT renal stone study:  1. 3 mm left ureterovesical junction stone causing moderate left-sided hydronephrosis. 2. Hydronephrosis of the right renal collecting system without obstructive uropathy noted. Findings could be related to a recently passed stone. Given the distended appearance of the bladder, this could be a result vesicoureteral reflux alternatively. Other etiologies including stricture or a urothelial lesion are not entirely excluded but believed less likely.  Continue abx, foley, flomax  Repeat renal US on 5/6:  "Resolved right side and regressed left side hydronephrosis since 12/20/2016." urology consulted, input appreciated   Constipation with proctitis: Ct :Rectal thickening with  perirectal inflammation surrounding a large stool ball consistent with proctitis. Continue rocephin, add flagyl, stool regimen/enema  Hypokalemia/ Hypomagnesemia: replace k/ mag  ?noninsulin dependent diabetes: per chart review, he does not appear to be on any meds for this,   a1c 6.1 Diet control  Chronic normocytic anemia: hgb 10.3 at baseline, hgb 8.1 on 5/7.  hypothyroidism: continue home meds synthroid  h/o asthma: no wheezing on exam, cxr no acute findings, he in on room air, continue home meds.  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  RBBB: chronic  Diet:   Dysphagia 1 (puree) thin liquids  Medication Administration: Crushed with puree per speech eval   DVT prophylaxis: lovenox   Code Status: full  Family Communication: patient   Disposition Plan: return to group home with urology clearance and fever free for 24hrs   Consultants:  Urology Dr Vernie Ammons  Procedures:  none  Antibiotics:  Rocephin from admission to 5/7  Flagyl from 5/4--5/7  augmentin from 5/7   Objective: BP 128/60 (BP Location: Left Leg)   Pulse 72   Temp 98.9 F (37.2 C) (Axillary)   Resp 18   Ht 5' (1.524 m)   Wt 49.2 kg (108 lb 7.5 oz)   SpO2 92%   BMI 21.18 kg/m   Intake/Output Summary (Last 24 hours) at 12/24/16 1803 Last data filed at 12/24/16 0648  Gross per 24 hour  Intake             1445 ml  Output              475 ml  Net  970 ml   Filed Weights   12/20/16 1018 12/20/16 2015  Weight: 47.6 kg (105 lb) 49.2 kg (108 lb 7.5 oz)    Exam:   General:  Chronic Developmental abnormalities , does talk with indistinct words, does not follow commands,   Eyes: PERRL  ENT: very dry oral mucosa, poor dentition   Neck: supple, no JVD  Cardiovascular: RRR  Respiratory: diminished, no wheezing  Abdomen: soft, positive bowel sounds, does not seem to be  tender  Skin: no rash  Musculoskeletal:  No edema  Psychiatric: not intelligible   Neurologic: not oriented, not follow commands, not interactive    Data Reviewed: Basic Metabolic Panel:  Recent Labs Lab 12/20/16 1054 12/21/16 0409 12/22/16 0320 12/23/16 0535 12/24/16 0543  NA 145 144 144 142 144  K 4.1 3.9 3.9 3.4* 3.3*  CL 109 110 111 112* 113*  CO2 30 27 26 26 25   GLUCOSE 158* 110* 81 109* 119*  BUN 24* 16 16 15 13   CREATININE 1.40* 1.12 1.01 0.91 0.90  CALCIUM 9.0 8.1* 7.7* 7.4* 7.4*  MG  --   --  1.6* 2.0  --    Liver Function Tests:  Recent Labs Lab 12/20/16 1054  AST 22  ALT 13*  ALKPHOS 64  BILITOT 0.6  PROT 6.9  ALBUMIN 2.7*   No results for input(s): LIPASE, AMYLASE in the last 168 hours. No results for input(s): AMMONIA in the last 168 hours. CBC:  Recent Labs Lab 12/20/16 1054 12/21/16 0409 12/22/16 0320 12/23/16 0535 12/24/16 0543  WBC 7.0 9.1 10.3 6.9 5.6  NEUTROABS 5.0  --  7.6  --   --   HGB 10.3* 8.9* 9.2* 8.4* 8.1*  HCT 31.7* 27.9* 28.6* 25.5* 24.4*  MCV 96.1 95.9 95.3 93.8 93.8  PLT 282 289 264 266 273   Cardiac Enzymes:   No results for input(s): CKTOTAL, CKMB, CKMBINDEX, TROPONINI in the last 168 hours. BNP (last 3 results) No results for input(s): BNP in the last 8760 hours.  ProBNP (last 3 results) No results for input(s): PROBNP in the last 8760 hours.  CBG: No results for input(s): GLUCAP in the last 168 hours.  Recent Results (from the past 240 hour(s))  Urine culture     Status: Abnormal   Collection Time: 12/20/16  1:00 PM  Result Value Ref Range Status   Specimen Description URINE, CATHETERIZED  Final   Special Requests NONE  Final   Culture >=100,000 COLONIES/mL ESCHERICHIA COLI (A)  Final   Report Status 12/23/2016 FINAL  Final   Organism ID, Bacteria ESCHERICHIA COLI (A)  Final      Susceptibility   Escherichia coli - MIC*    AMPICILLIN 8 SENSITIVE Sensitive     CEFAZOLIN <=4 SENSITIVE Sensitive      CEFTRIAXONE <=1 SENSITIVE Sensitive     CIPROFLOXACIN <=0.25 SENSITIVE Sensitive     GENTAMICIN <=1 SENSITIVE Sensitive     IMIPENEM <=0.25 SENSITIVE Sensitive     NITROFURANTOIN <=16 SENSITIVE Sensitive     TRIMETH/SULFA <=20 SENSITIVE Sensitive     AMPICILLIN/SULBACTAM 4 SENSITIVE Sensitive     PIP/TAZO <=4 SENSITIVE Sensitive     Extended ESBL NEGATIVE Sensitive     * >=100,000 COLONIES/mL ESCHERICHIA COLI  Blood culture (routine x 2)     Status: None (Preliminary result)   Collection Time: 12/20/16  4:04 PM  Result Value Ref Range Status   Specimen Description BLOOD RIGHT ANTECUBITAL  Final   Special Requests   Final  BOTTLES DRAWN AEROBIC AND ANAEROBIC Blood Culture results may not be optimal due to an inadequate volume of blood received in culture bottles   Culture   Final    NO GROWTH 4 DAYS Performed at [facility], [address] [place], [place] [address]    Report Status PENDING  Incomplete  Blood culture (routine x 2)     Status: None (Preliminary result)   Collection Time: 12/20/16  4:16 PM  Result Value Ref Range Status   Specimen Description BLOOD RIGHT HAND  Final   Special Requests   Final    BOTTLES DRAWN AEROBIC ONLY Blood Culture adequate volume   Culture   Final    NO GROWTH 4 DAYS Performed at [facility], [address] [place], [place] [address]    Report Status PENDING  Incomplete  MRSA PCR Screening     Status: Abnormal   Collection Time: 12/20/16  8:22 PM  Result Value Ref Range Status   MRSA by PCR POSITIVE (A) NEGATIVE Final    Comment:        The GeneXpert MRSA Assay (FDA approved for NASAL specimens only), is one component of a comprehensive MRSA colonization surveillance program. It is not intended to diagnose MRSA infection nor to guide or monitor treatment for MRSA infections. RESULT CALLED TO, READ BACK BY AND VERIFIED WITH: ODOM,S RN 5.3.18 @2232  ZANDO,C      Studies: Dg Abd 1 View  Result Date:  12/24/2016 CLINICAL DATA:  66 year old male with 3 mm distal left ureteral calculus on recent CT Abdomen and Pelvis. Hydronephrosis regressed on subsequent ultrasound. EXAM: ABDOMEN - 1 VIEW COMPARISON:  CT Abdomen and Pelvis 12/20/2016. Renal ultrasound 12/23/2016. KUB 01/01/2015. FINDINGS: Stable cholecystectomy clips. Non obstructed bowel gas pattern. Abdominal and pelvic visceral contours are stable. Dextroconvex lumbar scoliosis with multilevel advanced lumbar disc, endplate and posterior element degeneration. No acute osseous abnormality identified. The small distal left ureteral calculus is not identified in the pelvis, but probably would not be radiographically apparent. No urologic calculus identified. IMPRESSION: 1. Negative KUB appearance of the abdomen. The 3 mm distal left ureteral calculus seen recently by CT would probably not be visible radiographically. 2. Dextroconvex lumbar scoliosis and lumbar spine degeneration. Electronically Signed   By: Odessa Fleming M.D.   On: 12/24/2016 11:18   Ct Chest Wo Contrast  Result Date: 12/24/2016 CLINICAL DATA:  Fever. EXAM: CT CHEST WITHOUT CONTRAST TECHNIQUE: Multidetector CT imaging of the chest was performed following the standard protocol without IV contrast. COMPARISON:  One-view chest x-ray 12/20/2016. FINDINGS: Cardiovascular: The heart size is normal. Coronary artery calcifications are present. Mediastinum/Nodes: No significant mediastinal or axillary adenopathy is present. Lungs/Pleura: Thickening of the interlobular septa and some ground-glass attenuation suggests edema. No significant airspace consolidation is present. Small bilateral pleural effusions are evident. Upper Abdomen: Cholecystectomy is noted. No focal lesions are present. Musculoskeletal: Endplate degenerative changes are most evident at T6-7 and T7-8. Vertebral body heights and AP alignment are anatomic. Leftward curvature is present in the mid thoracic spine. The ribs are unremarkable.  IMPRESSION: 1. Interstitial edema and small effusions suggesting congestive heart failure. 2. No focal airspace disease. 3. Coronary artery disease. 4. Cholecystectomy. Electronically Signed   By: Marin Roberts M.D.   On: 12/24/2016 17:22    Scheduled Meds: . allopurinol  100 mg Oral QHS  . amoxicillin-clavulanate  1 tablet Oral Q12H  . chlorhexidine  15 mL Mouth Rinse BID  . Chlorhexidine Gluconate Cloth  6 each Topical Daily  . memantine  10 mg Oral BID   And  . donepezil  10 mg Oral QHS  . enoxaparin (LOVENOX) injection  40 mg Subcutaneous Q24H  . famotidine  20 mg Oral QHS  . fluticasone  1 spray Each Nare Daily  . lactulose  6.7 g Oral Daily  . levothyroxine  88 mcg Oral QAC breakfast  . mouth rinse  15 mL Mouth Rinse q12n4p  . mupirocin ointment  1 application Nasal BID  . senna-docusate  1 tablet Oral BID  . sodium chloride flush  3 mL Intravenous Q12H  . tamsulosin  0.4 mg Oral Daily  . white petrolatum   Topical BID    Continuous Infusions:    Time spent: 35mins  Quest Tavenner MD, PhD  [facility] Pager [phone]. If 7PM-7AM, please contact night-coverage at [email or web address], password [email or web address] 12/24/2016, 6:03 PM  LOS: 4 days

## 2016-12-24 NOTE — Clinical Social Work Note (Signed)
Clinical Social Work Assessment  Patient Details  Name: [name] [name] MRN: [id number] Date of Birth: [birth year]  Date of referral:  12/24/16               Reason for consult:  Facility Placement                Permission sought to share information with:    Permission granted to share information::     Name::      [name] [name]   Agency::  [facility]  Relationship::     Contact Information:   [phone]  Housing/Transportation Living arrangements for the past 2 months:  Assisted Living Facility ([facility]) Source of Information:  Facility Patient Interpreter Needed:  None Criminal Activity/Legal Involvement Pertinent to Current Situation/Hospitalization:  No - Comment as needed Significant Relationships:  Other(Comment) Engineer, building services(Facility) Lives with:  Facility Resident Do you feel safe going back to the place where you live?  Yes Need for family participation in patient care:  No (Coment)  Care giving concerns:  Patient [facility] a [facility]. Patient is Long term resident.    Social Worker assessment / plan: Plan: Assist with discharge back to [facility].   Employment status:  Disabled (Comment on whether or not currently receiving Disability) Down Syndrome  Insurance information:  Medicare PT Recommendations:  Not assessed at this time Information / Referral to community resources:     Patient/Family's Response to care:  Agreeable to care.   Patient/Family's Understanding of and Emotional Response to Diagnosis, Current Treatment, and Prognosis:  No family at bedside.   Emotional Assessment Appearance:  Developmentally appropriate Attitude/Demeanor/Rapport:    Affect (typically observed):  Calm Orientation:  Oriented to Self, Oriented to Place, Oriented to  Time, Oriented to Situation Alcohol / Substance use:  Not Applicable Psych involvement (Current and /or in the community):  No (Comment)  Discharge Needs  Concerns to be addressed:  Discharge Planning  Concerns, Care Coordination Readmission within the last 30 days:  No Current discharge risk:  None, Cognitively Impaired, Physical Impairment Barriers to Discharge:  Continued Medical Work up   [employer] A [name] Marty, LCSW 12/24/2016, 12:24 PM

## 2016-12-24 NOTE — Progress Notes (Signed)
Paged MD that CT of chest was back.

## 2016-12-24 NOTE — Progress Notes (Addendum)
Initial Nutrition Assessment  INTERVENTION:   Continue Magic cup TID with meals, each supplement provides 290 kcal and 9 grams of protein RD to continue to monitor for needs  NUTRITION DIAGNOSIS:   Unintentional weight loss related to other (see comment), dysphagia (difficulty feeding self) as evidenced by percent weight loss.  GOAL:   Patient will meet greater than or equal to 90% of their needs  MONITOR:   PO intake, Supplement acceptance, Labs, Weight trends, I & O's  REASON FOR ASSESSMENT:   Low Braden    ASSESSMENT:   66 y.o. male Has a h/o mental retardation/dementia from a group home, baseline wheelchair bound, total care, [facility] due to more lethargic than normal.   Patient unable to provide history given dementia. Pt follows a pureed diet at facility that he lives in. Pt requires feeding assistance and is wheelchair bound. Per chart review, pt has consumed 75-100% of meals provided in the past 24 hours. Pt is receiving Magic Cups on trays, will continue order.  SLP evaluated 5/7: pt with mild-moderate aspiration risk d/t dysphagia. Recommend dysphagia 1 diet with thin liquids only via tsp.   Per chart review, pt has been losing weight over the past couple of years but is insignificant for time frame. Any depletion noted may be related to wheelchair bound status.   Medications: Senokot-S tablet BID  Labs reviewed: Low K  Diet Order:  DIET - DYS 1 Room service appropriate? Yes; Fluid consistency: Thin  Skin:  Reviewed, no issues  Last BM:  5/7  Height:   Ht Readings from Last 1 Encounters:  12/21/16 5' (1.524 m)    Weight:   Wt Readings from Last 1 Encounters:  12/20/16 108 lb 7.5 oz (49.2 kg)    Ideal Body Weight:  48.2 kg  BMI:  Body mass index is 21.18 kg/m.  Estimated Nutritional Needs:   Kcal:  1300-1500  Protein:  60-70g  Fluid:  1.5L/day  EDUCATION NEEDS:   No education needs identified at this time  Tilda FrancoLindsey Leanza Shepperson, MS,  RD, LDN Pager: [phone] After Hours Pager: [phone]

## 2016-12-24 NOTE — Care Management Important Message (Signed)
Important Message  Patient Details IM Letter given to Suzanne/Case Manager to present to Patient Name: [name] [name] MRN: [id number] Date of Birth: 05-15-1951   Medicare Important Message Given:  Yes    Caren MacadamFuller, Isayah Ignasiak 12/24/2016, 11:52 AMImportant Message  Patient Details  Name: [name] [name] MRN: [id number] Date of Birth: 05-15-1951   Medicare Important Message Given:  Yes    Caren MacadamFuller, Sheli Dorin 12/24/2016, 11:52 AM

## 2016-12-25 DIAGNOSIS — A4151 Sepsis due to Escherichia coli [E. coli]: Secondary | ICD-10-CM

## 2016-12-25 DIAGNOSIS — G9341 Metabolic encephalopathy: Secondary | ICD-10-CM

## 2016-12-25 LAB — CBC
HCT: 27.8 % — ABNORMAL LOW (ref 39.0–52.0)
Hemoglobin: 9.1 g/dL — ABNORMAL LOW (ref 13.0–17.0)
MCH: 30.5 pg (ref 26.0–34.0)
MCHC: 32.7 g/dL (ref 30.0–36.0)
MCV: 93.3 fL (ref 78.0–100.0)
PLATELETS: 330 10*3/uL (ref 150–400)
RBC: 2.98 MIL/uL — ABNORMAL LOW (ref 4.22–5.81)
RDW: 15.9 % — AB (ref 11.5–15.5)
WBC: 5.2 10*3/uL (ref 4.0–10.5)

## 2016-12-25 LAB — BASIC METABOLIC PANEL
Anion gap: 7 (ref 5–15)
BUN: 12 mg/dL (ref 6–20)
CO2: 28 mmol/L (ref 22–32)
CREATININE: 0.84 mg/dL (ref 0.61–1.24)
Calcium: 7.8 mg/dL — ABNORMAL LOW (ref 8.9–10.3)
Chloride: 110 mmol/L (ref 101–111)
GFR calc Af Amer: >60 mL/min (ref >60)
GLUCOSE: 109 mg/dL — AB (ref 65–99)
Potassium: 3.5 mmol/L (ref 3.5–5.1)
SODIUM: 145 mmol/L (ref 135–145)

## 2016-12-25 LAB — CULTURE, BLOOD (ROUTINE X 2)
CULTURE: NO GROWTH
Culture: NO GROWTH
SPECIAL REQUESTS: ADEQUATE

## 2016-12-25 LAB — MAGNESIUM: Magnesium: 1.7 mg/dL (ref 1.7–2.4)

## 2016-12-25 MED ORDER — POTASSIUM CHLORIDE ER 10 MEQ PO TBCR: 10.0000 meq | EXTENDED_RELEASE_TABLET | Freq: Every day | ORAL | 0 refills | Status: AC

## 2016-12-25 MED ORDER — POTASSIUM CHLORIDE CRYS ER 20 MEQ PO TBCR
40.0000 meq | EXTENDED_RELEASE_TABLET | Freq: Once | ORAL | Status: AC
  Administered 2016-12-25: 40 meq via ORAL
  Filled 2016-12-25: qty 2

## 2016-12-25 MED ORDER — AMOXICILLIN-POT CLAVULANATE 875-125 MG PO TABS: 1.0000 | ORAL_TABLET | Freq: Two times a day (BID) | ORAL | 0 refills | Status: AC

## 2016-12-25 MED ORDER — SENNOSIDES-DOCUSATE SODIUM 8.6-50 MG PO TABS: 1.0000 | ORAL_TABLET | Freq: Every day | ORAL | 0 refills | Status: DC

## 2016-12-25 MED ORDER — MUPIROCIN 2 % EX OINT: 1.0000 "application " | TOPICAL_OINTMENT | Freq: Two times a day (BID) | CUTANEOUS | 0 refills | Status: DC

## 2016-12-25 MED ORDER — BISACODYL 10 MG RE SUPP: 10.0000 mg | Freq: Every day | RECTAL | 0 refills | Status: DC

## 2016-12-25 MED ORDER — SODIUM CHLORIDE 0.9 % IV BOLUS (SEPSIS)
500.0000 mL | Freq: Once | INTRAVENOUS | Status: AC
  Administered 2016-12-25: 500 mL via INTRAVENOUS

## 2016-12-25 MED ORDER — CHLORHEXIDINE GLUCONATE CLOTH 2 % EX PADS: 6.0000 | MEDICATED_PAD | Freq: Every day | CUTANEOUS | 0 refills | Status: AC

## 2016-12-25 NOTE — Final Consult Note (Signed)
Assessment:  Bilateral hydronephrosis - His renal ultrasound showed resolution of his right hydronephrosis with a Foley catheter in indicating that this is most likely due to secondary development of vesicoureteral reflux.  The dilatation of the collecting system on the left was also noted to have diminished.  A KUB revealed no stone visible along the course of the distal ureter.  It appears he most likely has passed his stone.  His Foley catheter may be removed and converted to a condom catheter at any time now.  UTI - his culture was positive on 5/3.  He grew E. coli that was pansensitive.  I would recommend continued oral antibiotics for 1 week upon discharge.  Left distal ureteral stone - this appears to have passed by ultrasound and KUB.    Plan: 1.  Foley catheter may be removed at any time and converted to a condom catheter. 2.  He will follow up with me as needed.   Subjective: Patient not able to give any history.  Objective: Vital signs in last 24 hours: Temp:  [98.8 F (37.1 C)-99.3 F (37.4 C)] 98.8 F (37.1 C) (05/08 0433) Pulse Rate:  [66-87] 66 (05/08 0433) Resp:  [18] 18 (05/08 0433) BP: (81-128)/(46-77) 94/46 (05/08 0637) SpO2:  [92 %-97 %] 95 % (05/08 0433)A  Intake/Output from previous day: 05/07 0701 - 05/08 0700 In: 500 [IV Piggyback:500] Out: 1800 [Urine:1800] Intake/Output this shift: Total I/O In: 500 [IV Piggyback:500] Out: 1800 [Urine:1800]  Past Medical History:  Diagnosis Date  . Diabetes mellitus without complication (HCC)   . Down syndrome     Physical Exam:  Lungs - Normal respiratory effort, chest expands symmetrically.  Abdomen - Soft, non-tender & non-distended.  Lab Results:  Recent Labs  12/23/16 0535 12/24/16 0543  WBC 6.9 5.6  HGB 8.4* 8.1*  HCT 25.5* 24.4*   BMET  Recent Labs  12/23/16 0535 12/24/16 0543  NA 142 144  K 3.4* 3.3*  CL 112* 113*  CO2 26 25  GLUCOSE 109* 119*  BUN 15 13  CREATININE 0.91 0.90   CALCIUM 7.4* 7.4*   No results for input(s): LABURIN in the last 72 hours. Results for orders placed or performed during the hospital encounter of 12/20/16  Urine culture     Status: Abnormal   Collection Time: 12/20/16  1:00 PM  Result Value Ref Range Status   Specimen Description URINE, CATHETERIZED  Final   Special Requests NONE  Final   Culture >=100,000 COLONIES/mL ESCHERICHIA COLI (A)  Final   Report Status 12/23/2016 FINAL  Final   Organism ID, Bacteria ESCHERICHIA COLI (A)  Final      Susceptibility   Escherichia coli - MIC*    AMPICILLIN 8 SENSITIVE Sensitive     CEFAZOLIN <=4 SENSITIVE Sensitive     CEFTRIAXONE <=1 SENSITIVE Sensitive     CIPROFLOXACIN <=0.25 SENSITIVE Sensitive     GENTAMICIN <=1 SENSITIVE Sensitive     IMIPENEM <=0.25 SENSITIVE Sensitive     NITROFURANTOIN <=16 SENSITIVE Sensitive     TRIMETH/SULFA <=20 SENSITIVE Sensitive     AMPICILLIN/SULBACTAM 4 SENSITIVE Sensitive     PIP/TAZO <=4 SENSITIVE Sensitive     Extended ESBL NEGATIVE Sensitive     * >=100,000 COLONIES/mL ESCHERICHIA COLI  Blood culture (routine x 2)     Status: None (Preliminary result)   Collection Time: 12/20/16  4:04 PM  Result Value Ref Range Status   Specimen Description BLOOD RIGHT ANTECUBITAL  Final   Special Requests  Final    BOTTLES DRAWN AEROBIC AND ANAEROBIC Blood Culture results may not be optimal due to an inadequate volume of blood received in culture bottles   Culture   Final    NO GROWTH 4 DAYS Performed at [facility], [address] [place], [place] [address]    Report Status PENDING  Incomplete  Blood culture (routine x 2)     Status: None (Preliminary result)   Collection Time: 12/20/16  4:16 PM  Result Value Ref Range Status   Specimen Description BLOOD RIGHT HAND  Final   Special Requests   Final    BOTTLES DRAWN AEROBIC ONLY Blood Culture adequate volume   Culture   Final    NO GROWTH 4 DAYS Performed at [facility], [address] [place], [place] [address]    Report Status PENDING  Incomplete  MRSA PCR Screening     Status: Abnormal   Collection Time: 12/20/16  8:22 PM  Result Value Ref Range Status   MRSA by PCR POSITIVE (A) NEGATIVE Final    Comment:        The GeneXpert MRSA Assay (FDA approved for NASAL specimens only), is one component of a comprehensive MRSA colonization surveillance program. It is not intended to diagnose MRSA infection nor to guide or monitor treatment for MRSA infections. RESULT CALLED TO, READ BACK BY AND VERIFIED WITH: ODOM,S RN 5.3.18 @2232  ZANDO,C     Studies/Results: Dg Abd 1 View  Result Date: 12/24/2016 CLINICAL DATA:  66 year old male with 3 mm distal left ureteral calculus on recent CT Abdomen and Pelvis. Hydronephrosis regressed on subsequent ultrasound. EXAM: ABDOMEN - 1 VIEW COMPARISON:  CT Abdomen and Pelvis 12/20/2016. Renal ultrasound 12/23/2016. KUB 01/01/2015. FINDINGS: Stable cholecystectomy clips. Non obstructed bowel gas pattern. Abdominal and pelvic visceral contours are stable. Dextroconvex lumbar scoliosis with multilevel advanced lumbar disc, endplate and posterior element degeneration. No acute osseous abnormality identified. The small distal left ureteral calculus is not identified in the pelvis, but probably would not be radiographically apparent. No urologic calculus identified. IMPRESSION: 1. Negative KUB appearance of the abdomen. The 3 mm distal left ureteral calculus seen recently by CT would probably not be visible radiographically. 2. Dextroconvex lumbar scoliosis and lumbar spine degeneration. Electronically Signed   By: Odessa FlemingH  Hall M.D.   On: 12/24/2016 11:18   Ct Chest Wo Contrast  Result Date: 12/24/2016 CLINICAL DATA:  Fever. EXAM: CT CHEST WITHOUT CONTRAST TECHNIQUE: Multidetector CT imaging of the chest was performed following the standard protocol without IV contrast. COMPARISON:  One-view chest x-ray 12/20/2016. FINDINGS: Cardiovascular: The heart size  is normal. Coronary artery calcifications are present. Mediastinum/Nodes: No significant mediastinal or axillary adenopathy is present. Lungs/Pleura: Thickening of the interlobular septa and some ground-glass attenuation suggests edema. No significant airspace consolidation is present. Small bilateral pleural effusions are evident. Upper Abdomen: Cholecystectomy is noted. No focal lesions are present. Musculoskeletal: Endplate degenerative changes are most evident at T6-7 and T7-8. Vertebral body heights and AP alignment are anatomic. Leftward curvature is present in the mid thoracic spine. The ribs are unremarkable. IMPRESSION: 1. Interstitial edema and small effusions suggesting congestive heart failure. 2. No focal airspace disease. 3. Coronary artery disease. 4. Cholecystectomy. Electronically Signed   By: Marin Robertshristopher  Mattern M.D.   On: 12/24/2016 17:22      Amulya Quintin C 12/25/2016, 6:53 AM

## 2016-12-25 NOTE — Progress Notes (Signed)
Pt discharged back to group home. Report will be called to caretakers. Discharge instructions packet was given to transporter. She did not want me to review the discharge instructions with her. Pt foley removed before discharge. No questions or concerns at this time.  Allison Deshotels W Sacha Topor, RN

## 2016-12-25 NOTE — Progress Notes (Signed)
Patient ready for discharge.CSW spoke with [facility] at [facility]  About patient discharge and faxed d/c summary. She request RN call and give updates about patient care. CSW provided RN with contact number. The group home plans to transport patient back to facility. Patient prescriptions placed in transport envelope.  No other needs identified at this time.   Vivi BarrackNicole Derhonda Eastlick, Theresia MajorsLCSWA, MSW Clinical Social Worker 5E and Psychiatric Service Line [phone] 12/25/2016  9:44 AM

## 2016-12-25 NOTE — Progress Notes (Signed)
Date: Dec 25, 2016  Discharge orders review for case management needs.  None found  Rhonda Davis, BSN, RN3, CCM:  [phone] 

## 2016-12-25 NOTE — Discharge Summary (Signed)
Discharge Summary  [name] [name] UJW:[id number] DOB: 07/12/1951  PCP: Lucretia Field  Admit date: 12/20/2016 Discharge date: 12/25/2016  Time spent: <33mins  Recommendations for Outpatient Follow-up:  1. F/u with PMD within a week  for hospital discharge follow up, repeat cbc/bmp at follow up 2. f/u with urology as needed  Discharge Diagnoses:  Active Hospital Problems   Diagnosis Date Noted  . Sepsis [facility])     Resolved Hospital Problems   Diagnosis Date Noted Date Resolved  No resolved problems to display.    Discharge Condition: stable  Diet recommendation:  Diet recommendations: Dysphagia 1 (puree);Thin liquid (via tsp only ) Medication Administration: Crushed with puree Supervision: Full supervision/cueing for compensatory strategies Compensations: Slow rate;Small sips/bites;Other (Comment) (delayed swallow, assure pt swallows before giving more)  Filed Weights   12/20/16 1018 12/20/16 2015  Weight: 47.6 kg (105 lb) 49.2 kg (108 lb 7.5 oz)    History of present illness:  PCP: Lucretia Field   Chief Complaint: altered mental status  (patient can not provide history, history gathered by chart review, talking to EDP and group home care giver at bedside)  HPI: [name] [name] is a 66 y.o. male   Has a h/o mental retardation/dementia from a group home, baseline wheelchair bound, total care, is brought to [facility] ED due to more lethargic than normal.   ED course, he has hypotension on presentation, ua + uti, blood culture and urine culture obtained from the ED, he is started on ivf /rocephin, hospitalist call to admit the patient, due to concerning for hypotension, stepdown unit is requested.   Of note patient was hospitalized in march /2018 for uti, care giver denies new onset of diarrhea.   Hospital Course:  Active Problems:   Sepsis (HCC)  Sepsis with recurrent uti/hydronephrosis, proctitis:  Due to hypotension he is admitted to stepdown unit,  cxr no acute  findings, Blood culture no growth , urine culture pansentitive Ecoli, ct renal stone study + hydro, + proctitis   he received rocephin 2gram since admission, flagyl added due to concerns of proctitis, with clinical improvement abx changed to oral augmentin   home bp meds norvasc held, he received ivf which is stopped on 5/7. Leukocytosis/cr/hydronephrosis resolved, but keep spiking fever,  ct chest no aspiration pneumonia,  no cough, no hypoxia,  -no diarrhea, no skin issues, fever resolved at discharge,   Metabolic encephalopathy: he was drowsy , not interactive initially, with treatment he became more alert and interactive, he said good morning on 5/8 and he says " I am in the hospital" on 5/8. Likely back to baseline mental status  AKI/ CKD II/III/UVJ stone/bilateral hydronephrosis: patient's cr wnl in 2017, but cr have been elevated during last hospitalization and this time.  Cr 1.4 on admission, 0.8 on 5/8, likely AKI on ckd II/III  "CT renal stone study:  1. 3 mm left ureterovesical junction stone causing moderate left-sided hydronephrosis. 2. Hydronephrosis of the right renal collecting system without obstructive uropathy noted. Findings could be related to a recently passed stone. Given the distended appearance of the bladder, this could be a result vesicoureteral reflux alternatively. Other etiologies including stricture or a urothelial lesion are not entirely excluded but believed less likely.  He is treated with abx, foley, flomax  Repeat renal US on 5/6:  "Resolved right side and regressed left side hydronephrosis since 12/20/2016." urology consulted, input appreciated, he is cleared to discharge with continue oral abx for a week, foley removed prior to  discharge, he is to follow with urology as needed, detail please see urology note by Dr Vernie Ammons on 5/8   Constipation with proctitis: Ct :Rectal thickening with perirectal inflammation surrounding a large stool ball  consistent with proctitis. He is treated with rocephin/ flagyl, abx changed to augmentin at discharge - stool regimen, prevent constipation  Hypokalemia/ Hypomagnesemia: replace k/ mag  ?noninsulin dependent diabetes: per chart review, he does not appear to be on any meds for this,  a1c 6.1 Diet control  Chronic normocytic anemia:hgb 10.3 at baseline, hgb 8.1 on 5/7.  hypothyroidism:continue home meds synthroid  h/o asthma:no wheezing on exam, cxr no acute findings, he in on room air, continue home meds.  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 and decolonization protocol  RBBB: chronic  Diet:   Dysphagia 1 (puree) thin liquids  Medication Administration: Crushed with pureeper speech eval   DVT prophylaxis while in the hospital: lovenox   Code Status: full  Family Communication: patient   Disposition Plan: return to group home with urology clearance    Consultants:  Urology Dr Vernie Ammons  Procedures:  none  Antibiotics:  Rocephin from admission to 5/7  Flagyl from 5/4--5/7  augmentin from 5/7    Discharge Exam: BP (!) 94/46 (BP Location: Left Arm)   Pulse 66   Temp 98.8 F (37.1 C) (Axillary)   Resp 18   Ht 5' (1.524 m)   Wt 49.2 kg (108 lb 7.5 oz)   SpO2 95%   BMI 21.18 kg/m    General:Chronic Developmental abnormalities , does talk with indistinct words, does not follow commands,   Eyes:PERRL  ZOX:WRUE dry oral mucosa, poor dentition   Neck:supple, no JVD  Cardiovascular:RRR  Respiratory:diminished, no wheezing  Abdomen:soft,positive bowel sounds, does not seem to be tender  Skin:no rash  Musculoskeletal:No edema  Psychiatric:not intelligible   Neurologic:not oriented, not follow commands, not interactive   Discharge Instructions You were cared for by a hospitalist during your hospital stay. If you have any questions about your  discharge medications or the care you received while you were in the hospital after you are discharged, you can call the unit and asked to speak with the hospitalist on call if the hospitalist that took care of you is not available. Once you are discharged, your primary care physician will handle any further medical issues. Please note that NO REFILLS for any discharge medications will be authorized once you are discharged, as it is imperative that you return to your primary care physician (or establish a relationship with a primary care physician if you do not have one) for your aftercare needs so that they can reassess your need for medications and monitor your lab values.  Discharge Instructions    Diet - low sodium heart healthy    Complete by:  As directed    Puree with thin liquid, detail please see speech eval   Discharge instructions    Complete by:  As directed    Prevent constipation   Increase activity slowly    Complete by:  As directed      Allergies as of 12/25/2016   No Known Allergies     Medication List    STOP taking these medications   amLODipine 2.5 MG tablet Commonly known as:  NORVASC   loperamide 2 MG capsule Commonly known as:  IMODIUM   oxybutynin 5 MG tablet Commonly known as:  DITROPAN     TAKE these medications  acetaminophen 325 MG tablet Commonly known as:  TYLENOL Take 650 mg by mouth every 4 (four) hours as needed for mild pain, moderate pain, fever or headache.   albuterol (2.5 MG/3ML) 0.083% nebulizer solution Commonly known as:  PROVENTIL Take 2.5 mg by nebulization 2 (two) times daily.   albuterol 108 (90 Base) MCG/ACT inhaler Commonly known as:  PROVENTIL HFA;VENTOLIN HFA Inhale 2 puffs into the lungs every 6 (six) hours as needed for wheezing or shortness of breath.   allopurinol 100 MG tablet Commonly known as:  ZYLOPRIM Take 100 mg by mouth at bedtime.   alum & mag hydroxide-simeth 200-200-20 MG/5ML suspension Commonly known as:   MAALOX/MYLANTA Take 15-30 mLs by mouth every 2 (two) hours as needed for indigestion or heartburn.   amoxicillin-clavulanate 875-125 MG tablet Commonly known as:  AUGMENTIN Take 1 tablet by mouth every 12 (twelve) hours.   aspirin 81 MG chewable tablet Chew 81 mg by mouth daily.   bisacodyl 10 MG suppository Commonly known as:  DULCOLAX Place 1 suppository (10 mg total) rectally daily. What changed:  when to take this  reasons to take this   calcium-vitamin D 500-200 MG-UNIT tablet Commonly known as:  OSCAL WITH D Take 1 tablet by mouth 2 (two) times daily.   carbamide peroxide 6.5 % otic solution Commonly known as:  DEBROX Place 5 drops into both ears 2 (two) times daily as needed (for ear wax removal).   Chlorhexidine Gluconate Cloth 2 % Pads Apply 6 each topically daily.   clindamycin 1 % gel Commonly known as:  CLINDAGEL Apply 1 application topically daily.   diphenhydrAMINE 12.5 MG/5ML elixir Commonly known as:  BENADRYL Take 25-50 mg by mouth 4 (four) times daily as needed for itching or allergies.   fluticasone 50 MCG/ACT nasal spray Commonly known as:  FLONASE Place 1 spray into both nostrils daily.   guaifenesin 100 MG/5ML syrup Commonly known as:  ROBITUSSIN Take 300 mg by mouth every 4 (four) hours as needed for cough.   hydrocortisone cream 1 % Apply 1 application topically 4 (four) times daily as needed for itching (and/or bug bites).   lactulose 10 GM/15ML solution Commonly known as:  CHRONULAC Take 6.7 g by mouth daily.   levothyroxine 88 MCG tablet Commonly known as:  SYNTHROID, LEVOTHROID Take 88 mcg by mouth daily before breakfast.   magnesium hydroxide 400 MG/5ML suspension Commonly known as:  MILK OF MAGNESIA Take 15-30 mLs by mouth daily as needed for mild constipation.   Melatonin 3 MG Tabs Take 3 mg by mouth at bedtime.   mineral oil-hydrophilic petrolatum ointment Apply 1 application topically 2 (two) times daily. Pt applies to  hands and affected skin.   mupirocin ointment 2 % Commonly known as:  BACTROBAN Place 1 application into the nose 2 (two) times daily.   NAMZARIC 28-10 MG Cp24 Generic drug:  Memantine HCl-Donepezil HCl Take 1 capsule by mouth daily.   neomycin-bacitracin-polymyxin [phone] ointment Apply 1 application topically 2 (two) times daily as needed (for skin tears/abrasions).   potassium chloride 10 MEQ tablet Commonly known as:  K-DUR Take 1 tablet (10 mEq total) by mouth daily.   pravastatin 20 MG tablet Commonly known as:  PRAVACHOL Take 20 mg by mouth at bedtime.   promethazine 25 MG tablet Commonly known as:  PHENERGAN Take 25 mg by mouth every 4 (four) hours as needed for nausea or vomiting.   ranitidine 150 MG tablet Commonly known as:  ZANTAC Take 150 mg by  mouth 2 (two) times daily.   senna-docusate 8.6-50 MG tablet Commonly known as:  Senokot-S Take 1 tablet by mouth at bedtime. Hold if diarrhea   tamsulosin 0.4 MG Caps capsule Commonly known as:  FLOMAX Take 0.4 mg by mouth daily.   vitamin A & D ointment Apply 1 application topically 3 (three) times daily as needed for dry skin.      No Known Allergies Follow-up Information    Royals, Gretta Began Follow up in 1 week(s).   Specialty:  Family Medicine Why:  hospital discharge follow up, repeat cbc/bmp at follow up Contact information: [address] [place] [address] [phone]        Ihor Gully, MD Follow up in 1 month(s).   Specialty:  Urology Why:  as needed for recurrent uti/kidney stone/urinary retention/hydronephrosis Contact information: [address] [place] [address] [phone]            The results of significant diagnostics from this hospitalization (including imaging, microbiology, ancillary and laboratory) are listed below for reference.    Significant Diagnostic Studies: Dg Abd 1 View  Result Date: 12/24/2016 CLINICAL DATA:  66 year old male with 3 mm distal  left ureteral calculus on recent CT Abdomen and Pelvis. Hydronephrosis regressed on subsequent ultrasound. EXAM: ABDOMEN - 1 VIEW COMPARISON:  CT Abdomen and Pelvis 12/20/2016. Renal ultrasound 12/23/2016. KUB 01/01/2015. FINDINGS: Stable cholecystectomy clips. Non obstructed bowel gas pattern. Abdominal and pelvic visceral contours are stable. Dextroconvex lumbar scoliosis with multilevel advanced lumbar disc, endplate and posterior element degeneration. No acute osseous abnormality identified. The small distal left ureteral calculus is not identified in the pelvis, but probably would not be radiographically apparent. No urologic calculus identified. IMPRESSION: 1. Negative KUB appearance of the abdomen. The 3 mm distal left ureteral calculus seen recently by CT would probably not be visible radiographically. 2. Dextroconvex lumbar scoliosis and lumbar spine degeneration. Electronically Signed   By: Odessa Fleming M.D.   On: 12/24/2016 11:18   Ct Chest Wo Contrast  Result Date: 12/24/2016 CLINICAL DATA:  Fever. EXAM: CT CHEST WITHOUT CONTRAST TECHNIQUE: Multidetector CT imaging of the chest was performed following the standard protocol without IV contrast. COMPARISON:  One-view chest x-ray 12/20/2016. FINDINGS: Cardiovascular: The heart size is normal. Coronary artery calcifications are present. Mediastinum/Nodes: No significant mediastinal or axillary adenopathy is present. Lungs/Pleura: Thickening of the interlobular septa and some ground-glass attenuation suggests edema. No significant airspace consolidation is present. Small bilateral pleural effusions are evident. Upper Abdomen: Cholecystectomy is noted. No focal lesions are present. Musculoskeletal: Endplate degenerative changes are most evident at T6-7 and T7-8. Vertebral body heights and AP alignment are anatomic. Leftward curvature is present in the mid thoracic spine. The ribs are unremarkable. IMPRESSION: 1. Interstitial edema and small effusions suggesting  congestive heart failure. 2. No focal airspace disease. 3. Coronary artery disease. 4. Cholecystectomy. Electronically Signed   By: Marin Roberts M.D.   On: 12/24/2016 17:22   US Abdomen Complete  Result Date: 12/23/2016 CLINICAL DATA:  66 year old male with bilateral hydronephrosis. 3 mm left UVJ calculus. Right side obstructing etiology not evident on recent CT. EXAM: ABDOMEN ULTRASOUND COMPLETE COMPARISON:  CT Abdomen and Pelvis without contrast 12/20/2016 and earlier FINDINGS: Gallbladder: Surgically absent. Common bile duct: Diameter: 6 mm, within normal limits Liver: No focal lesion identified. Within normal limits in parenchymal echogenicity. IVC: No abnormality visualized. Pancreas: Visualized portion unremarkable. Spleen: Size and appearance within normal limits. Right Kidney: Length: 8.5 cm. No evidence of right hydronephrosis today,  renal sinus fat is visible. Renal cortical volume loss noted. Left Kidney: Length: 9.7 cm. Mild left hydronephrosis, mildly improved from recent CT. Less pronounced renal cortical volume loss. Abdominal aorta: No aneurysm visualized. Other findings: None. IMPRESSION: 1. Resolved right side and regressed left side hydronephrosis since 12/20/2016. 2. Otherwise negative ultrasound appearance of the abdomen. Electronically Signed   By: Odessa Fleming M.D.   On: 12/23/2016 15:13   Dg Chest Port 1 View  Result Date: 12/20/2016 CLINICAL DATA:  Actively wheezing. EXAM: PORTABLE CHEST 1 VIEW COMPARISON:  10/22/2016 FINDINGS: 1011 hours. Low lung volumes. Cardiopericardial silhouette is at upper limits of normal for size. The lungs are clear wiithout focal pneumonia, edema, pneumothorax or pleural effusion. Bones are diffusely demineralized. Degenerative changes noted right shoulder. Telemetry leads overlie the chest. IMPRESSION: No active disease. Electronically Signed   By: Kennith Center M.D.   On: 12/20/2016 10:30   Ct Renal Stone Study  Result Date: 12/20/2016 CLINICAL  DATA:  Increasing lethargy. History of recurrent urinary tract infections and Down syndrome. EXAM: CT ABDOMEN AND PELVIS WITHOUT CONTRAST TECHNIQUE: Multidetector CT imaging of the abdomen and pelvis was performed following the standard protocol without IV contrast. COMPARISON:  None. FINDINGS: Lower chest: The visualized cardiac chambers are mildly enlarged but stable. No pericardial effusion. There is minimal bibasilar atelectasis without pneumonic consolidation, effusion or pneumothorax. Hepatobiliary: Cholecystectomy. No biliary dilatation or hepatic mass. Pancreas: No pancreatic ductal dilatation. The noncontrast appearance of the pancreas is unremarkable. Spleen: Normal Adrenals/Urinary Tract: Stable normal appearing bilateral adrenal glands. Hydronephrosis noted bilaterally with faint calcifications in the interpolar aspect of the right kidney again noted. There appears to be a 3 mm calcification in the distal left UVJ, series 2, image 63 which may explain the dilated left renal collecting system. No calculus is seen on the right. The bladder however is markedly distended and vesicoureteral reflux accounting for this appearance may explain this appearance versus prior passage of a stone. A ureteral stricture is not entirely excluded or potentially a urothelial lesion but no conclusive CT findings to suggest such. Stomach/Bowel: No bowel dilatation. Stomach is contracted. There appears to be normal small bowel rotation. A moderate amount of stool is seen within large bowel. There is perirectal inflammation with thickening of rectal walls surrounding a large focus of stool. Findings would be in keeping with proctitis. Vascular/Lymphatic: No significant vascular findings are present. No enlarged abdominal or pelvic lymph nodes. Reproductive: Prostate is unremarkable. Other: No abdominal wall hernia or abnormality. No abdominopelvic ascites. Musculoskeletal: Mild dextroconvex curvature of the lumbar spine with  degenerative disc disease L1 through L3. Grade 1 anterolisthesis of L4 on L5 with L3 through S1 facet arthropathy. IMPRESSION: 1. 3 mm left ureterovesical junction stone causing moderate left-sided hydronephrosis. 2. Hydronephrosis of the right renal collecting system without obstructive uropathy noted. Findings could be related to a recently passed stone. Given the distended appearance of the bladder, this could be a result vesicoureteral reflux alternatively. Other etiologies including stricture or a urothelial lesion are not entirely excluded but believed less likely. 3. Rectal thickening with perirectal inflammation surrounding a large stool ball consistent with proctitis. 4. Lumbar spondylosis with dextroscoliosis. Electronically Signed   By: Tollie Eth M.D.   On: 12/20/2016 19:42    Microbiology: Recent Results (from the past 240 hour(s))  Urine culture     Status: Abnormal   Collection Time: 12/20/16  1:00 PM  Result Value Ref Range Status   Specimen Description URINE, CATHETERIZED  Final  Special Requests NONE  Final   Culture >=100,000 COLONIES/mL ESCHERICHIA COLI (A)  Final   Report Status 12/23/2016 FINAL  Final   Organism ID, Bacteria ESCHERICHIA COLI (A)  Final      Susceptibility   Escherichia coli - MIC*    AMPICILLIN 8 SENSITIVE Sensitive     CEFAZOLIN <=4 SENSITIVE Sensitive     CEFTRIAXONE <=1 SENSITIVE Sensitive     CIPROFLOXACIN <=0.25 SENSITIVE Sensitive     GENTAMICIN <=1 SENSITIVE Sensitive     IMIPENEM <=0.25 SENSITIVE Sensitive     NITROFURANTOIN <=16 SENSITIVE Sensitive     TRIMETH/SULFA <=20 SENSITIVE Sensitive     AMPICILLIN/SULBACTAM 4 SENSITIVE Sensitive     PIP/TAZO <=4 SENSITIVE Sensitive     Extended ESBL NEGATIVE Sensitive     * >=100,000 COLONIES/mL ESCHERICHIA COLI  Blood culture (routine x 2)     Status: None (Preliminary result)   Collection Time: 12/20/16  4:04 PM  Result Value Ref Range Status   Specimen Description BLOOD RIGHT ANTECUBITAL   Final   Special Requests   Final    BOTTLES DRAWN AEROBIC AND ANAEROBIC Blood Culture results may not be optimal due to an inadequate volume of blood received in culture bottles   Culture   Final    NO GROWTH 4 DAYS Performed at [facility], [address], [place], [place] [address]    Report Status PENDING  Incomplete  Blood culture (routine x 2)     Status: None (Preliminary result)   Collection Time: 12/20/16  4:16 PM  Result Value Ref Range Status   Specimen Description BLOOD RIGHT HAND  Final   Special Requests   Final    BOTTLES DRAWN AEROBIC ONLY Blood Culture adequate volume   Culture   Final    NO GROWTH 4 DAYS Performed at [facility], [address], [place], [place] [address]    Report Status PENDING  Incomplete  MRSA PCR Screening     Status: Abnormal   Collection Time: 12/20/16  8:22 PM  Result Value Ref Range Status   MRSA by PCR POSITIVE (A) NEGATIVE Final    Comment:        The GeneXpert MRSA Assay (FDA approved for NASAL specimens only), is one component of a comprehensive MRSA colonization surveillance program. It is not intended to diagnose MRSA infection nor to guide or monitor treatment for MRSA infections. RESULT CALLED TO, READ BACK BY AND VERIFIED WITH: ODOM,S RN 5.3.18 @2232  ZANDO,C      Labs: Basic Metabolic Panel:  Recent Labs Lab 12/21/16 0409 12/22/16 0320 12/23/16 0535 12/24/16 0543 12/25/16 0650  NA 144 144 142 144 145  K 3.9 3.9 3.4* 3.3* 3.5  CL 110 111 112* 113* 110  CO2 27 26 26 25 28   GLUCOSE 110* 81 109* 119* 109*  BUN 16 16 15 13 12   CREATININE 1.12 1.01 0.91 0.90 0.84  CALCIUM 8.1* 7.7* 7.4* 7.4* 7.8*  MG  --  1.6* 2.0  --  1.7   Liver Function Tests:  Recent Labs Lab 12/20/16 1054  AST 22  ALT 13*  ALKPHOS 64  BILITOT 0.6  PROT 6.9  ALBUMIN 2.7*   No results for input(s): LIPASE, AMYLASE in the last 168 hours. No results for input(s): AMMONIA in the last 168 hours. CBC:  Recent Labs Lab  12/20/16 1054 12/21/16 0409 12/22/16 0320 12/23/16 0535 12/24/16 0543 12/25/16 0650  WBC 7.0 9.1 10.3 6.9 5.6 5.2  NEUTROABS  5.0  --  7.6  --   --   --   HGB 10.3* 8.9* 9.2* 8.4* 8.1* 9.1*  HCT 31.7* 27.9* 28.6* 25.5* 24.4* 27.8*  MCV 96.1 95.9 95.3 93.8 93.8 93.3  PLT 282 289 264 266 273 330   Cardiac Enzymes: No results for input(s): CKTOTAL, CKMB, CKMBINDEX, TROPONINI in the last 168 hours. BNP: BNP (last 3 results) No results for input(s): BNP in the last 8760 hours.  ProBNP (last 3 results) No results for input(s): PROBNP in the last 8760 hours.  CBG: No results for input(s): GLUCAP in the last 168 hours.     SignedAlbertine Grates:  Evea Sheek MD, PhD  [facility] 12/25/2016, 8:34 AM

## 2017-01-06 ENCOUNTER — Emergency Department (HOSPITAL_COMMUNITY): Payer: Medicare Other

## 2017-01-06 ENCOUNTER — Encounter (HOSPITAL_COMMUNITY): Payer: Self-pay | Admitting: Emergency Medicine

## 2017-01-06 ENCOUNTER — Emergency Department (HOSPITAL_COMMUNITY)
Admission: EM | Admit: 2017-01-06 | Discharge: 2017-01-06 | Disposition: A | Discharge status: Home / Self Care | Payer: Medicare Other | Source: Home / Self Care | Attending: Emergency Medicine | Admitting: Emergency Medicine

## 2017-01-06 ENCOUNTER — Inpatient Hospital Stay (HOSPITAL_COMMUNITY)
Admission: EM | Admit: 2017-01-06 | Discharge: 2017-01-16 | DRG: 871 | Disposition: A | Discharge status: Skilled Nursing Facility | Payer: Medicare Other | Attending: Internal Medicine | Admitting: Internal Medicine

## 2017-01-06 DIAGNOSIS — Z87440 Personal history of urinary (tract) infections: Secondary | ICD-10-CM | POA: Diagnosis not present

## 2017-01-06 DIAGNOSIS — R69 Illness, unspecified: Secondary | ICD-10-CM | POA: Diagnosis not present

## 2017-01-06 DIAGNOSIS — Q909 Down syndrome, unspecified: Secondary | ICD-10-CM | POA: Diagnosis not present

## 2017-01-06 DIAGNOSIS — L89153 Pressure ulcer of sacral region, stage 3: Secondary | ICD-10-CM | POA: Diagnosis present

## 2017-01-06 DIAGNOSIS — Z7982 Long term (current) use of aspirin: Secondary | ICD-10-CM

## 2017-01-06 DIAGNOSIS — N183 Chronic kidney disease, stage 3 unspecified: Secondary | ICD-10-CM | POA: Diagnosis present

## 2017-01-06 DIAGNOSIS — Z7189 Other specified counseling: Secondary | ICD-10-CM

## 2017-01-06 DIAGNOSIS — D649 Anemia, unspecified: Secondary | ICD-10-CM | POA: Diagnosis not present

## 2017-01-06 DIAGNOSIS — E876 Hypokalemia: Secondary | ICD-10-CM | POA: Diagnosis not present

## 2017-01-06 DIAGNOSIS — I129 Hypertensive chronic kidney disease with stage 1 through stage 4 chronic kidney disease, or unspecified chronic kidney disease: Secondary | ICD-10-CM | POA: Diagnosis present

## 2017-01-06 DIAGNOSIS — Z515 Encounter for palliative care: Secondary | ICD-10-CM | POA: Diagnosis not present

## 2017-01-06 DIAGNOSIS — L89152 Pressure ulcer of sacral region, stage 2: Secondary | ICD-10-CM

## 2017-01-06 DIAGNOSIS — N179 Acute kidney failure, unspecified: Secondary | ICD-10-CM

## 2017-01-06 DIAGNOSIS — A419 Sepsis, unspecified organism: Principal | ICD-10-CM | POA: Diagnosis present

## 2017-01-06 DIAGNOSIS — E039 Hypothyroidism, unspecified: Secondary | ICD-10-CM | POA: Diagnosis present

## 2017-01-06 DIAGNOSIS — Z993 Dependence on wheelchair: Secondary | ICD-10-CM | POA: Diagnosis not present

## 2017-01-06 DIAGNOSIS — E7800 Pure hypercholesterolemia, unspecified: Secondary | ICD-10-CM | POA: Diagnosis present

## 2017-01-06 DIAGNOSIS — E860 Dehydration: Secondary | ICD-10-CM | POA: Diagnosis present

## 2017-01-06 DIAGNOSIS — Z6821 Body mass index (BMI) 21.0-21.9, adult: Secondary | ICD-10-CM

## 2017-01-06 DIAGNOSIS — D631 Anemia in chronic kidney disease: Secondary | ICD-10-CM | POA: Diagnosis present

## 2017-01-06 DIAGNOSIS — E870 Hyperosmolality and hypernatremia: Secondary | ICD-10-CM | POA: Diagnosis present

## 2017-01-06 DIAGNOSIS — Z79899 Other long term (current) drug therapy: Secondary | ICD-10-CM

## 2017-01-06 DIAGNOSIS — E1122 Type 2 diabetes mellitus with diabetic chronic kidney disease: Secondary | ICD-10-CM | POA: Diagnosis present

## 2017-01-06 DIAGNOSIS — I959 Hypotension, unspecified: Secondary | ICD-10-CM | POA: Insufficient documentation

## 2017-01-06 DIAGNOSIS — K219 Gastro-esophageal reflux disease without esophagitis: Secondary | ICD-10-CM | POA: Diagnosis present

## 2017-01-06 DIAGNOSIS — J690 Pneumonitis due to inhalation of food and vomit: Secondary | ICD-10-CM | POA: Diagnosis present

## 2017-01-06 DIAGNOSIS — E440 Moderate protein-calorie malnutrition: Secondary | ICD-10-CM | POA: Insufficient documentation

## 2017-01-06 DIAGNOSIS — R532 Functional quadriplegia: Secondary | ICD-10-CM | POA: Diagnosis present

## 2017-01-06 DIAGNOSIS — R4182 Altered mental status, unspecified: Secondary | ICD-10-CM | POA: Diagnosis present

## 2017-01-06 DIAGNOSIS — G9341 Metabolic encephalopathy: Secondary | ICD-10-CM | POA: Diagnosis present

## 2017-01-06 DIAGNOSIS — Z66 Do not resuscitate: Secondary | ICD-10-CM | POA: Diagnosis not present

## 2017-01-06 DIAGNOSIS — F0391 Unspecified dementia with behavioral disturbance: Secondary | ICD-10-CM | POA: Diagnosis not present

## 2017-01-06 DIAGNOSIS — R627 Adult failure to thrive: Secondary | ICD-10-CM | POA: Diagnosis present

## 2017-01-06 DIAGNOSIS — R509 Fever, unspecified: Secondary | ICD-10-CM | POA: Insufficient documentation

## 2017-01-06 DIAGNOSIS — I9589 Other hypotension: Secondary | ICD-10-CM | POA: Diagnosis present

## 2017-01-06 DIAGNOSIS — Z7951 Long term (current) use of inhaled steroids: Secondary | ICD-10-CM

## 2017-01-06 DIAGNOSIS — R531 Weakness: Secondary | ICD-10-CM

## 2017-01-06 LAB — CBC WITH DIFFERENTIAL/PLATELET
BASOS ABS: 0 10*3/uL (ref 0.0–0.1)
BASOS PCT: 0 %
BASOS PCT: 0 %
Basophils Absolute: 0 10*3/uL (ref 0.0–0.1)
EOS ABS: 0 10*3/uL (ref 0.0–0.7)
EOS PCT: 0 %
Eosinophils Absolute: 0 10*3/uL (ref 0.0–0.7)
Eosinophils Relative: 0 %
HCT: 41 % (ref 39.0–52.0)
HEMATOCRIT: 30.1 % — AB (ref 39.0–52.0)
Hemoglobin: 9.5 g/dL — ABNORMAL LOW (ref 13.0–17.0)
Hemoglobin: 12.8 g/dL — ABNORMAL LOW (ref 13.0–17.0)
Lymphocytes Relative: 17 %
Lymphocytes Relative: 8 %
Lymphs Abs: 1.7 10*3/uL (ref 0.7–4.0)
Lymphs Abs: 0.9 10*3/uL (ref 0.7–4.0)
MCH: 30.1 pg (ref 26.0–34.0)
MCH: 29.5 pg (ref 26.0–34.0)
MCHC: 31.6 g/dL (ref 30.0–36.0)
MCHC: 31.2 g/dL (ref 30.0–36.0)
MCV: 95.3 fL (ref 78.0–100.0)
MCV: 94.5 fL (ref 78.0–100.0)
MONO ABS: 0.7 10*3/uL (ref 0.1–1.0)
MONOS PCT: 12 %
MONOS PCT: 6 %
Monocytes Absolute: 1.2 10*3/uL — ABNORMAL HIGH (ref 0.1–1.0)
NEUTROS ABS: 6.7 10*3/uL (ref 1.7–7.7)
Neutro Abs: 9.5 10*3/uL — ABNORMAL HIGH (ref 1.7–7.7)
Neutrophils Relative %: 71 %
Neutrophils Relative %: 86 %
PLATELETS: 268 10*3/uL (ref 150–400)
Platelets: 376 10*3/uL (ref 150–400)
RBC: 3.16 MIL/uL — ABNORMAL LOW (ref 4.22–5.81)
RBC: 4.34 MIL/uL (ref 4.22–5.81)
RDW: 16.4 % — ABNORMAL HIGH (ref 11.5–15.5)
RDW: 16.2 % — AB (ref 11.5–15.5)
WBC: 9.5 10*3/uL (ref 4.0–10.5)
WBC: 11.1 10*3/uL — ABNORMAL HIGH (ref 4.0–10.5)

## 2017-01-06 LAB — COMPREHENSIVE METABOLIC PANEL
ALBUMIN: 2 g/dL — AB (ref 3.5–5.0)
ALK PHOS: 65 U/L (ref 38–126)
ALT: 14 U/L — AB (ref 17–63)
ANION GAP: 8 (ref 5–15)
AST: 27 U/L (ref 15–41)
BUN: 27 mg/dL — AB (ref 6–20)
CO2: 25 mmol/L (ref 22–32)
Calcium: 8.9 mg/dL (ref 8.9–10.3)
Chloride: 114 mmol/L — ABNORMAL HIGH (ref 101–111)
Creatinine, Ser: 1.64 mg/dL — ABNORMAL HIGH (ref 0.61–1.24)
GFR calc Af Amer: 50 mL/min — ABNORMAL LOW (ref >60)
GFR calc non Af Amer: 43 mL/min — ABNORMAL LOW (ref >60)
GLUCOSE: 128 mg/dL — AB (ref 65–99)
Potassium: 4.1 mmol/L (ref 3.5–5.1)
SODIUM: 147 mmol/L — AB (ref 135–145)
Total Bilirubin: 0.6 mg/dL (ref 0.3–1.2)
Total Protein: 6 g/dL — ABNORMAL LOW (ref 6.5–8.1)

## 2017-01-06 LAB — URINALYSIS, ROUTINE W REFLEX MICROSCOPIC
BILIRUBIN URINE: NEGATIVE
Glucose, UA: NEGATIVE mg/dL
KETONES UR: NEGATIVE mg/dL
Leukocytes, UA: NEGATIVE
NITRITE: NEGATIVE
PROTEIN: NEGATIVE mg/dL
Specific Gravity, Urine: 1.015 (ref 1.005–1.030)
pH: 5 (ref 5.0–8.0)

## 2017-01-06 LAB — I-STAT CG4 LACTIC ACID, ED
LACTIC ACID, VENOUS: 1.46 mmol/L (ref 0.5–1.9)
Lactic Acid, Venous: 1.75 mmol/L (ref 0.5–1.9)
Lactic Acid, Venous: 2.16 mmol/L (ref 0.5–1.9)

## 2017-01-06 LAB — CBG MONITORING, ED: GLUCOSE-CAPILLARY: 157 mg/dL — AB (ref 65–99)

## 2017-01-06 MED ORDER — ALLOPURINOL 100 MG PO TABS
100.0000 mg | ORAL_TABLET | Freq: Every day | ORAL | Status: DC
  Administered 2017-01-07 – 2017-01-15 (×9): 100 mg via ORAL
  Filled 2017-01-06 (×10): qty 1

## 2017-01-06 MED ORDER — IOPAMIDOL (ISOVUE-370) INJECTION 76%
INTRAVENOUS | Status: AC
Start: 2017-01-06 — End: 2017-01-06
  Administered 2017-01-06: 100 mL
  Filled 2017-01-06: qty 100

## 2017-01-06 MED ORDER — MELATONIN 3 MG PO TABS
3.0000 mg | ORAL_TABLET | Freq: Every day | ORAL | Status: DC
  Administered 2017-01-07 – 2017-01-15 (×9): 3 mg via ORAL
  Filled 2017-01-06 (×11): qty 1

## 2017-01-06 MED ORDER — ONDANSETRON HCL 4 MG/2ML IJ SOLN: 4.0000 mg | Freq: Three times a day (TID) | INTRAMUSCULAR | Status: DC | PRN

## 2017-01-06 MED ORDER — AQUAPHOR EX OINT
1.0000 "application " | TOPICAL_OINTMENT | Freq: Two times a day (BID) | CUTANEOUS | Status: DC
  Administered 2017-01-07 – 2017-01-16 (×18): 1 via TOPICAL
  Filled 2017-01-06: qty 50

## 2017-01-06 MED ORDER — FLUTICASONE PROPIONATE 50 MCG/ACT NA SUSP
1.0000 | Freq: Every day | NASAL | Status: DC
  Administered 2017-01-07 – 2017-01-16 (×10): 1 via NASAL
  Filled 2017-01-06: qty 16

## 2017-01-06 MED ORDER — DEXTROSE 5 % IV SOLN
1.0000 g | INTRAVENOUS | Status: AC
  Administered 2017-01-06: 1 g via INTRAVENOUS
  Filled 2017-01-06: qty 1

## 2017-01-06 MED ORDER — ASPIRIN 81 MG PO CHEW
81.0000 mg | CHEWABLE_TABLET | Freq: Every day | ORAL | Status: DC
  Administered 2017-01-08 – 2017-01-16 (×9): 81 mg via ORAL
  Filled 2017-01-06 (×9): qty 1

## 2017-01-06 MED ORDER — FAMOTIDINE 20 MG PO TABS: 20.0000 mg | ORAL_TABLET | Freq: Two times a day (BID) | ORAL | Status: DC

## 2017-01-06 MED ORDER — ACETAMINOPHEN 325 MG PO TABS
650.0000 mg | ORAL_TABLET | Freq: Four times a day (QID) | ORAL | Status: DC | PRN
  Administered 2017-01-07 – 2017-01-14 (×4): 650 mg via ORAL
  Filled 2017-01-06 (×4): qty 2

## 2017-01-06 MED ORDER — SODIUM CHLORIDE 0.9 % IV SOLN: Freq: Once | INTRAVENOUS | Status: DC

## 2017-01-06 MED ORDER — CLINDAMYCIN PHOSPHATE 1 % EX GEL: 1.0000 "application " | Freq: Every day | CUTANEOUS | Status: DC

## 2017-01-06 MED ORDER — SODIUM CHLORIDE 0.9 % IV BOLUS (SEPSIS)
1000.0000 mL | Freq: Once | INTRAVENOUS | Status: AC
  Administered 2017-01-06: 1000 mL via INTRAVENOUS

## 2017-01-06 MED ORDER — ALBUTEROL SULFATE (2.5 MG/3ML) 0.083% IN NEBU: 2.5000 mg | INHALATION_SOLUTION | RESPIRATORY_TRACT | Status: DC | PRN

## 2017-01-06 MED ORDER — DEXTROSE 5 % IV SOLN
1.0000 g | Freq: Three times a day (TID) | INTRAVENOUS | Status: DC
  Filled 2017-01-06: qty 1

## 2017-01-06 MED ORDER — AMOXICILLIN-POT CLAVULANATE 875-125 MG PO TABS: 1.0000 | ORAL_TABLET | Freq: Two times a day (BID) | ORAL | 0 refills | Status: DC

## 2017-01-06 MED ORDER — DM-GUAIFENESIN ER 30-600 MG PO TB12: 1.0000 | ORAL_TABLET | Freq: Two times a day (BID) | ORAL | Status: DC | PRN

## 2017-01-06 MED ORDER — DEXTROSE 5 % IV SOLN
1.0000 g | Freq: Once | INTRAVENOUS | Status: DC
  Filled 2017-01-06: qty 1

## 2017-01-06 MED ORDER — MEMANTINE HCL-DONEPEZIL HCL ER 28-10 MG PO CP24: 1.0000 | ORAL_CAPSULE | Freq: Every day | ORAL | Status: DC

## 2017-01-06 MED ORDER — LEVOTHYROXINE SODIUM 25 MCG PO TABS
25.0000 ug | ORAL_TABLET | Freq: Every day | ORAL | Status: DC
  Administered 2017-01-08 – 2017-01-16 (×9): 25 ug via ORAL
  Filled 2017-01-06 (×9): qty 1

## 2017-01-06 MED ORDER — ENOXAPARIN SODIUM 30 MG/0.3ML ~~LOC~~ SOLN
30.0000 mg | SUBCUTANEOUS | Status: DC
  Administered 2017-01-07 – 2017-01-16 (×10): 30 mg via SUBCUTANEOUS
  Filled 2017-01-06 (×10): qty 0.3

## 2017-01-06 MED ORDER — HYDROCORTISONE 1 % EX CREA: 1.0000 "application " | TOPICAL_CREAM | Freq: Four times a day (QID) | CUTANEOUS | Status: DC | PRN

## 2017-01-06 MED ORDER — SODIUM CHLORIDE 0.9 % IV BOLUS (SEPSIS)
500.0000 mL | Freq: Once | INTRAVENOUS | Status: AC
  Administered 2017-01-06: 500 mL via INTRAVENOUS

## 2017-01-06 MED ORDER — SODIUM CHLORIDE 0.9 % IV BOLUS (SEPSIS): 1000.0000 mL | Freq: Once | INTRAVENOUS | Status: DC

## 2017-01-06 MED ORDER — LACTULOSE 10 GM/15ML PO SOLN: 10.0000 g | Freq: Every day | ORAL | Status: DC | PRN

## 2017-01-06 MED ORDER — TAMSULOSIN HCL 0.4 MG PO CAPS
0.4000 mg | ORAL_CAPSULE | Freq: Every day | ORAL | Status: DC
  Administered 2017-01-08 – 2017-01-16 (×9): 0.4 mg via ORAL
  Filled 2017-01-06 (×9): qty 1

## 2017-01-06 MED ORDER — PRAVASTATIN SODIUM 20 MG PO TABS
20.0000 mg | ORAL_TABLET | Freq: Every day | ORAL | Status: DC
  Administered 2017-01-07 – 2017-01-15 (×9): 20 mg via ORAL
  Filled 2017-01-06 (×9): qty 1

## 2017-01-06 MED ORDER — SODIUM CHLORIDE 0.9 % IV BOLUS (SEPSIS)
30.0000 mL/kg | Freq: Once | INTRAVENOUS | Status: AC
  Administered 2017-01-06: 1470 mL via INTRAVENOUS

## 2017-01-06 MED ORDER — DEXTROSE-NACL 5-0.45 % IV SOLN: INTRAVENOUS | Status: DC

## 2017-01-06 NOTE — ED Provider Notes (Signed)
[facility]-EMERGENCY DEPT Provider Note   CSN: [id number] Arrival date & time: 01/06/17  0802     History   Chief Complaint Chief Complaint  Patient presents with  . Code Sepsis  . Shortness of Breath    HPI [name] [name] is a 66 y.o. male.  He presents for evaluation, by EMS.  Since hospital discharge several days ago he has been less responsive, tending to slump over in his chair, and today he was worse, with rapid breathing, and even less responsive.  He lives in a nursing care facility.  He has mental retardation, and dementia.  He is full code.  He is here with a worker from the facility where he lives.  The patient does not have family members involved with his care.  He is in a state guardian program.  Level 5 caveat-altered mental status  HPI  Past Medical History:  Diagnosis Date  . Diabetes mellitus without complication (HCC)   . Down syndrome     Patient Active Problem List   Diagnosis Date Noted  . Metabolic encephalopathy 10/24/2016  . E coli bacteremia   . Acute cystitis without hematuria   . UTI (urinary tract infection) 10/23/2016  . Urinary tract infection 10/22/2016  . CKD (chronic kidney disease) stage 3, GFR 30-59 ml/min 10/22/2016  . Anemia 10/22/2016  . Hypotension   . Diabetes mellitus with complication (HCC)   . Sepsis (HCC)   . Chest pain 12/29/2014  . Pure hypercholesterolemia 05/14/2013  . DOWNS SYNDROME 09/14/2010  . ABDOMINAL PAIN, EPIGASTRIC 09/14/2010  . Type 2 diabetes mellitus, controlled (HCC) 10/17/2006    History reviewed. No pertinent surgical history.     Home Medications    Prior to Admission medications   Medication Sig Start Date End Date Taking? Authorizing Provider  acetaminophen (TYLENOL) 325 MG tablet Take 650 mg by mouth every 4 (four) hours as needed for mild pain, moderate pain, fever or headache.   Yes [provider]  albuterol (PROVENTIL HFA;VENTOLIN HFA) 108 (90 Base) MCG/ACT inhaler Inhale 2 puffs into  the lungs every 6 (six) hours as needed for wheezing or shortness of breath.    Yes [provider]  albuterol (PROVENTIL) (2.5 MG/3ML) 0.083% nebulizer solution Take 2.5 mg by nebulization 2 (two) times daily.    Yes [provider]  allopurinol (ZYLOPRIM) 100 MG tablet Take 100 mg by mouth at bedtime.    Yes [provider]  aspirin 81 MG chewable tablet Chew 81 mg by mouth daily.   Yes [provider]  clindamycin (CLINDAGEL) 1 % gel Apply 1 application topically daily.    Yes [provider]  fluticasone (FLONASE) 50 MCG/ACT nasal spray Place 1 spray into both nostrils daily.    Yes [provider]  hydrocortisone cream 1 % Apply 1 application topically 4 (four) times daily as needed for itching (and/or bug bites).   Yes [provider]  lactulose (CHRONULAC) 10 GM/15ML solution Take by mouth daily.    Yes [provider]  levothyroxine (SYNTHROID, LEVOTHROID) 25 MCG tablet Take 25 mcg by mouth daily before breakfast.   Yes [provider]  Melatonin 3 MG TABS Take 3 mg by mouth at bedtime.   Yes [provider]  Memantine HCl-Donepezil HCl (NAMZARIC) 28-10 MG CP24 Take 1 capsule by mouth daily.    Yes [provider]  mineral oil-hydrophilic petrolatum (AQUAPHOR) ointment Apply 1 application topically 2 (two) times daily. Pt applies to hands and affected  skin.   Yes [provider]  pravastatin (PRAVACHOL) 20 MG tablet Take 20 mg by mouth at bedtime.    Yes [provider]  ranitidine (ZANTAC) 150 MG tablet Take 150 mg by mouth 2 (two) times daily.   Yes [provider]  tamsulosin (FLOMAX) 0.4 MG CAPS capsule Take 0.4 mg by mouth daily.   Yes [provider]  amoxicillin-clavulanate (AUGMENTIN) 875-125 MG tablet Take 1 tablet by mouth 2 (two) times daily. One po bid x 7 days 01/06/17   Mancel Bale, MD  potassium chloride (K-DUR) 10 MEQ tablet Take 1 tablet (10  mEq total) by mouth daily. 12/25/16   Albertine Grates, MD    Family History No family history on file.  Social History Social History  Substance Use Topics  . Smoking status: Never Smoker  . Smokeless tobacco: Never Used  . Alcohol use No     Allergies   Patient has no known allergies.   Review of Systems Review of Systems  Unable to perform ROS: Mental status change     Physical Exam Updated Vital Signs BP (!) 146/85   Pulse 69   Temp 99 F (37.2 C) (Axillary)   Resp 16   Ht 5' (1.524 m)   Wt 108 lb (49 kg)   SpO2 96%   BMI 21.09 kg/m   Physical Exam  Constitutional: He appears well-developed. He appears distressed (Uncomfortable.  Open mouth breathing.).  Chronically ill-appearing.  Appears much older than stated age, and under nourished.  HENT:  Head: Normocephalic and atraumatic.  Right Ear: External ear normal.  Left Ear: External ear normal.  Eyes: Conjunctivae and EOM are normal. Pupils are equal, round, and reactive to light.  Neck: Normal range of motion and phonation normal. Neck supple.  Cardiovascular: Normal rate, regular rhythm and normal heart sounds.   Pulmonary/Chest: He exhibits no bony tenderness.  Tachypneic, shallow respirations.  Generalized rhonchi.  Abdominal: Soft. There is no tenderness.  Genitourinary:  Genitourinary Comments: Normal anus.  No stool in rectum.  No fecal impaction.  Skin breakdown left upper buttock, and mid sacral area, with thickening in the sacral region.  Sacral decubitus pressure sore stage 1-2, without associated fluctuance or drainage.  Buttocks wound is stage I.  Musculoskeletal: Normal range of motion.  Neurological:  Obtunded.  Mild spasticity in arms and legs bilaterally.  No focal asymmetry.  Skin: Skin is warm, dry and intact. No rash noted. No erythema.  Psychiatric: He has a normal mood and affect. His behavior is normal. Judgment and thought content normal.  Nursing note and vitals reviewed.    ED  Treatments / Results  Labs (all labs ordered are listed, but only abnormal results are displayed) Labs Reviewed  COMPREHENSIVE METABOLIC PANEL - Abnormal; Notable for the following:       Result Value   Sodium 147 (*)    Chloride 114 (*)    Glucose, Bld 128 (*)    BUN 27 (*)    Creatinine, Ser 1.64 (*)    Total Protein 6.0 (*)    Albumin 2.0 (*)    ALT 14 (*)    GFR calc non Af Amer 43 (*)    GFR calc Af Amer 50 (*)    All other components within normal limits  CBC WITH DIFFERENTIAL/PLATELET - Abnormal; Notable for the following:    RBC 3.16 (*)    Hemoglobin 9.5 (*)    HCT 30.1 (*)    RDW 16.4 (*)  Monocytes Absolute 1.2 (*)    All other components within normal limits  URINALYSIS, ROUTINE W REFLEX MICROSCOPIC - Abnormal; Notable for the following:    Hgb urine dipstick MODERATE (*)    Bacteria, UA RARE (*)    Squamous Epithelial / LPF 0-5 (*)    All other components within normal limits  CULTURE, BLOOD (ROUTINE X 2)  CULTURE, BLOOD (ROUTINE X 2)  URINE CULTURE  I-STAT CG4 LACTIC ACID, ED  I-STAT CG4 LACTIC ACID, ED    EKG  EKG Interpretation  Date/Time:  Sunday Jan 06 2017 08:08:37 EDT Ventricular Rate:  97 PR Interval:    QRS Duration: 135 QT Interval:  409 QTC Calculation: 520 R Axis:   52 Text Interpretation:  Sinus tachycardia Right bundle branch block since last tracing no significant change Confirmed by Mancel BaleWentz, Christophere Hillhouse [phone]([id number]) on 01/06/2017 8:16:08 AM       Radiology Ct Angio Chest Pe W/cm &/or Wo Cm  Result Date: 01/06/2017 CLINICAL DATA:  Pt Hx of Down's, WC bound and minimal verbalization baseline. Today has SOB Also patients arms are contracted and cannot be straightened either above the head or to the side EXAM: CT ANGIOGRAPHY CHEST WITH CONTRAST TECHNIQUE: Multidetector CT imaging of the chest was performed using the standard protocol during bolus administration of intravenous contrast. Multiplanar CT image reconstructions and MIPs were obtained to  evaluate the vascular anatomy. CONTRAST:  Isovue 370 80ml COMPARISON:  12/24/2016 FINDINGS: Cardiovascular: Satisfactory opacification of pulmonary arteries noted, and there is no evidence of pulmonary emboli. Incomplete opacification of the thoracic aorta with no suggestion of aneurysm, dissection, or stenosis. Mediastinum/Nodes: No enlarged mediastinal, hilar, or axillary lymph nodes. Thyroid gland, trachea, and esophagus demonstrate no significant findings. No pericardial effusion. Lungs/Pleura: No pleural effusion. No pneumothorax. Secretions in central right bronchial tree. Lungs are clear. Motion degrades images through the lung bases. Upper Abdomen: Cholecystectomy clips.  No acute findings. Musculoskeletal: Anterior vertebral endplate spurring at multiple levels in the mid thoracic spine. Advanced degenerative changes in bilateral shoulders. No fracture or worrisome bone lesion. Review of the MIP images confirms the above findings. IMPRESSION: 1. Negative for acute PE. 2. Secretions in central airways on the right. No focal pulmonary parenchymal process. Electronically Signed   By: Corlis Leak  Hassell M.D.   On: 01/06/2017 14:01   Dg Chest Port 1 View  Result Date: 01/06/2017 CLINICAL DATA:  SOB. [facility]. Pt was 72% RA on EMS arrival, rhonchi all fields and placed on NRB, sats came up to 94%. Hx of diabetes. EXAM: PORTABLE CHEST 1 VIEW COMPARISON:  12/20/2016 FINDINGS: Cardiac silhouette is normal in size. No mediastinal or hilar masses. No evidence of adenopathy. Prominent bronchovascular markings noted in the medial lung bases similar to prior studies consistent with chronic bronchitic change. Lungs otherwise clear. No pleural effusion or pneumothorax. Skeletal structures are demineralized but grossly intact. IMPRESSION: No active cardiopulmonary disease. Electronically Signed   By: Amie Portlandavid  Ormond M.D.   On: 01/06/2017 08:46    Procedures Procedures  (including critical care time)  Medications Ordered in ED Medications  ceFEPIme (MAXIPIME) 1 g in dextrose 5 % 50 mL IVPB (not administered)  sodium chloride 0.9 % bolus 1,000 mL (0 mLs Intravenous Stopped 01/06/17 0845)    And  sodium chloride 0.9 % bolus 500 mL (0 mLs Intravenous Stopped 01/06/17 0925)  ceFEPIme (MAXIPIME) 1 g in dextrose 5 % 50 mL IVPB (0 g Intravenous Stopped  01/06/17 0940)  iopamidol (ISOVUE-370) 76 % injection (100 mLs  Contrast Given 01/06/17 1330)     Initial Impression / Assessment and Plan / ED Course  I have reviewed the triage vital signs and the nursing notes.  Pertinent labs & imaging results that were available during my care of the patient were reviewed by me and considered in my medical decision making (see chart for details).  Clinical Course as of Jan 07 1603  Sun Jan 06, 2017  8119 Patient presents clinically septic, urine likely source however multiple possibilities exist.  Broad-spectrum antibiotics will be initiated, and aggressive evaluation done.  On arrival patient is full code.  [EW]    Clinical Course User Index [EW] Mancel Bale, MD     Patient Vitals for the past 24 hrs:  BP Temp Temp src Pulse Resp SpO2 Height Weight  01/06/17 1550 - 99 F (37.2 C) Axillary - - - - -  01/06/17 1500 (!) 146/85 - - 69 16 96 % - -  01/06/17 1400 94/62 - - 75 16 94 % - -  01/06/17 1330 - - - 80 15 98 % - -  01/06/17 1300 - - - 92 (!) 26 100 % - -  01/06/17 1243 - 98.8 F (37.1 C) Rectal - - - - -  01/06/17 1230 129/86 - - 71 20 100 % - -  01/06/17 1215 105/82 - - 70 17 100 % - -  01/06/17 1200 92/63 - - 77 17 98 % - -  01/06/17 1115 95/66 - - - 17 - - -  01/06/17 1100 - - - 87 15 - - -  01/06/17 1045 - - - 76 (!) 22 100 % - -  01/06/17 1015 (!) 120/99 - - 89 (!) 21 100 % - -  01/06/17 1000 101/87 - - (!) 57 18 93 % - -  01/06/17 0945 101/66 - - 86 (!) 23 97 % - -  01/06/17 0930 (!) 96/57 - - 86 17 92 % - -  01/06/17 0915 91/67 - - 82 15 98 % - -   01/06/17 0900 (!) 91/55 - - 78 (!) 24 100 % - -  01/06/17 0845 98/63 - - 68 (!) 23 100 % - -  01/06/17 0831 - - - - - - 5' (1.524 m) 108 lb (49 kg)  01/06/17 0830 107/67 - - 94 (!) 27 100 % - -  01/06/17 0829 (!) 123/59 - - 87 17 100 % - -  01/06/17 0824 - (!) 100.6 F (38.1 C) Rectal - - 93 % - -  01/06/17 0823 (!) 79/57 - - 96 20 100 % - -  01/06/17 0809 (!) 75/54 - - (!) 101 (!) 22 100 % - -    8:45 AM Reevaluation with update and discussion. After initial assessment and treatment, an updated evaluation reveals he remains alert, oxygenation 100% on Ventimask, 55% oxygen supplementation.  Rectal exam performed. Terez Montee L   3:17 PM Reevaluation with update and discussion. After initial assessment and treatment, an updated evaluation reveals at this time the oxygen saturation is 95% on room air.  The patient's caregiver is with him and states that he is at his baseline.  She feels that he can be managed in his current status at his facility.  The caregiver states that the patient has someone with him 24 hours a day, and a nurses with him 5 days a week, and will be there tomorrow.  Findings  discussed with the caregiver and all questions were answered. Adonus Uselman L     CRITICAL CARE Performed by: Flint Melter Total critical care time: 75 minutes Critical care time was exclusive of separately billable procedures and treating other patients. Critical care was necessary to treat or prevent imminent or life-threatening deterioration. Critical care was time spent personally by me on the following activities: development of treatment plan with patient and/or surrogate as well as nursing, discussions with consultants, evaluation of patient's response to treatment, examination of patient, obtaining history from patient or surrogate, ordering and performing treatments and interventions, ordering and review of laboratory studies, ordering and review of radiographic studies, pulse oximetry and  re-evaluation of patient's condition.   Final Clinical Impressions(s) / ED Diagnoses   Final diagnoses:  Weakness  AKI (acute kidney injury) (HCC)  Decubitus ulcer of sacral region, stage 2  Hypotension, unspecified hypotension type  Febrile illness   Patient with low-grade fever presenting symptoms concerning for sepsis, recurrent UTI versus pneumonia.  Initial low blood pressure, improved with IV fluids.  Lactate negative 2.  Hypoxia evaluated with chest x-ray, followed by CT angiogram, both without findings for acute respiratory distress.  I discharge normal oxygen saturation, 95%, on room air.  Patient in no distress and return to baseline according to his caregiver who is with him.  Suspect viral process with low-grade fever and increased respiratory secretions.  Patient is currently able to control his secretions, with normal vitals, and stable for discharge  Nursing Notes Reviewed/ Care Coordinated Applicable Imaging Reviewed Interpretation of Laboratory Data incorporated into ED treatment  The patient appears reasonably screened and/or stabilized for discharge and I doubt any other medical condition or other [facility] requiring further screening, evaluation, or treatment in the ED at this time prior to discharge.  Plan: Home Medications-continue usual; Home Treatments-wound care sacrum; return here if the recommended treatment, does not improve the symptoms; Recommended follow up-PCP checkup 1 week, and nursing care daily as planned    New Prescriptions Discharge Medication List as of 01/06/2017  3:48 PM    START taking these medications   Details  amoxicillin-clavulanate (AUGMENTIN) 875-125 MG tablet Take 1 tablet by mouth 2 (two) times daily. One po bid x 7 days, Starting Sun 01/06/2017, Print         Mancel Bale, MD 01/06/17 [phone]

## 2017-01-06 NOTE — ED Provider Notes (Signed)
[facility]EM[facility]ENCY DEPT Provider Note   CSN: [id number] Arrival date & time: 01/06/17  2009     History   Chief Complaint Chief Complaint  Patient presents with  . Altered Mental Status    HPI [name] LiasJohn W [name] is a 66 y.o. male.  HPI Patient is return from nursing home facility after assessment in the emergency department earlier today. Patient continues to do poorly with decreased mental status and increased respiratory rate. He did eat something this evening but continues to have wet productive cough. He is return for reevaluation. Past Medical History:  Diagnosis Date  . Diabetes mellitus without complication (HCC)   . Down syndrome     Patient Active Problem List   Diagnosis Date Noted  . GERD (gastroesophageal reflux disease) 01/06/2017  . Metabolic encephalopathy 10/24/2016  . E coli bacteremia   . Acute cystitis without hematuria   . UTI (urinary tract infection) 10/23/2016  . Urinary tract infection 10/22/2016  . CKD (chronic kidney disease) stage 3, GFR 30-59 ml/min 10/22/2016  . Anemia 10/22/2016  . Hypotension   . Diabetes mellitus with complication (HCC)   . Sepsis (HCC)   . Chest pain 12/29/2014  . Pure hypercholesterolemia 05/14/2013  . DOWNS SYNDROME 09/14/2010  . ABDOMINAL PAIN, EPIGASTRIC 09/14/2010  . Type 2 diabetes mellitus, controlled (HCC) 10/17/2006    No past surgical history on file.     Home Medications    Prior to Admission medications   Medication Sig Start Date End Date Taking? Authorizing Provider  acetaminophen (TYLENOL) 325 MG tablet Take 650 mg by mouth every 4 (four) hours as needed for mild pain, moderate pain, fever or headache.    [provider]  albuterol (PROVENTIL HFA;VENTOLIN HFA) 108 (90 Base) MCG/ACT inhaler Inhale 2 puffs into the lungs every 6 (six) hours as needed for wheezing or shortness of breath.     [provider]  albuterol (PROVENTIL) (2.5 MG/3ML) 0.083% nebulizer solution Take 2.5 mg by  nebulization 2 (two) times daily.     [provider]  allopurinol (ZYLOPRIM) 100 MG tablet Take 100 mg by mouth at bedtime.     [provider]  amoxicillin-clavulanate (AUGMENTIN) 875-125 MG tablet Take 1 tablet by mouth 2 (two) times daily. One po bid x 7 days 01/06/17   Mancel BaleWentz, Elliott, MD  aspirin 81 MG chewable tablet Chew 81 mg by mouth daily.    [provider]  clindamycin (CLINDAGEL) 1 % gel Apply 1 application topically daily.     [provider]  fluticasone (FLONASE) 50 MCG/ACT nasal spray Place 1 spray into both nostrils daily.     [provider]  hydrocortisone cream 1 % Apply 1 application topically 4 (four) times daily as needed for itching (and/or bug bites).    [provider]  lactulose (CHRONULAC) 10 GM/15ML solution Take by mouth daily.     [provider]  levothyroxine (SYNTHROID, LEVOTHROID) 25 MCG tablet Take 25 mcg by mouth daily before breakfast.    [provider]  Melatonin 3 MG TABS Take 3 mg by mouth at bedtime.    [provider]  Memantine HCl-Donepezil HCl (NAMZARIC) 28-10 MG CP24 Take 1 capsule by mouth daily.     [provider]  mineral oil-hydrophilic petrolatum (AQUAPHOR) ointment Apply 1 application topically 2 (two) times daily. Pt applies to hands and affected skin.    [provider]  potassium chloride (K-DUR) 10 MEQ tablet Take 1 tablet (10 mEq total) by mouth  daily. 12/25/16   Albertine Grates, MD  pravastatin (PRAVACHOL) 20 MG tablet Take 20 mg by mouth at bedtime.     [provider]  ranitidine (ZANTAC) 150 MG tablet Take 150 mg by mouth 2 (two) times daily.    [provider]  tamsulosin (FLOMAX) 0.4 MG CAPS capsule Take 0.4 mg by mouth daily.    [provider]    Family History No family history on file.  Social History Social History  Substance Use Topics  . Smoking status: Never Smoker  . Smokeless tobacco: Never Used  .  Alcohol use No     Allergies   Patient has no known allergies.   Review of Systems Review of Systems Level V caveat cannot obtain due to dementia.  Physical Exam Updated Vital Signs BP (!) 96/48   Pulse 99   Resp 19   SpO2 92%   Physical Exam  Constitutional:  The patient has extremely debilitated appearance of chronic illness. Intermittent wet cough. Mild tachypnea.  HENT:  Head: Normocephalic and atraumatic.  Mucous membranes dry.  Eyes: EOM are normal.  Cardiovascular: Normal rate, regular rhythm, normal heart sounds and intact distal pulses.   Pulmonary/Chest:  Intermittent wet cough. Diffuse rhonchi.  Abdominal: Soft. He exhibits no distension. There is no tenderness.  Musculoskeletal:  Patient has significant flexion contractures of the lower extremities. 1+ pitting edema bilateral lower extremities.  Neurological:  Patient is awake and will respond verbally with an unintelligible sound. He cannot really assist to follow commands.  Skin: Skin is warm. There is pallor.     ED Treatments / Results  Labs (all labs ordered are listed, but only abnormal results are displayed) Labs Reviewed  CBC WITH DIFFERENTIAL/PLATELET - Abnormal; Notable for the following:       Result Value   WBC 11.1 (*)    Hemoglobin 12.8 (*)    RDW 16.2 (*)    Neutro Abs 9.5 (*)    All other components within normal limits  CBG MONITORING, ED - Abnormal; Notable for the following:    Glucose-Capillary 157 (*)    All other components within normal limits  I-STAT CG4 LACTIC ACID, ED - Abnormal; Notable for the following:    Lactic Acid, Venous 2.16 (*)    All other components within normal limits  CULTURE, BLOOD (ROUTINE X 2)  CULTURE, BLOOD (ROUTINE X 2)    EKG  EKG Interpretation None       Radiology Ct Angio Chest Pe W/cm &/or Wo Cm  Result Date: 01/06/2017 CLINICAL DATA:  Pt Hx of Down's, WC bound and minimal verbalization baseline. Today has SOB Also patients arms are  contracted and cannot be straightened either above the head or to the side EXAM: CT ANGIOGRAPHY CHEST WITH CONTRAST TECHNIQUE: Multidetector CT imaging of the chest was performed using the standard protocol during bolus administration of intravenous contrast. Multiplanar CT image reconstructions and MIPs were obtained to evaluate the vascular anatomy. CONTRAST:  Isovue 370 80ml COMPARISON:  12/24/2016 FINDINGS: Cardiovascular: Satisfactory opacification of pulmonary arteries noted, and there is no evidence of pulmonary emboli. Incomplete opacification of the thoracic aorta with no suggestion of aneurysm, dissection, or stenosis. Mediastinum/Nodes: No enlarged mediastinal, hilar, or axillary lymph nodes. Thyroid gland, trachea, and esophagus demonstrate no significant findings. No pericardial effusion. Lungs/Pleura: No pleural effusion. No pneumothorax. Secretions in central right bronchial tree. Lungs are clear. Motion degrades images through the lung bases. Upper Abdomen: Cholecystectomy clips.  No acute findings. Musculoskeletal: Anterior  vertebral endplate spurring at multiple levels in the mid thoracic spine. Advanced degenerative changes in bilateral shoulders. No fracture or worrisome bone lesion. Review of the MIP images confirms the above findings. IMPRESSION: 1. Negative for acute PE. 2. Secretions in central airways on the right. No focal pulmonary parenchymal process. Electronically Signed   By: Corlis Leak M.D.   On: 01/06/2017 14:01   Dg Chest Port 1 View  Result Date: 01/06/2017 CLINICAL DATA:  SOB. Pt in from [facility] group home via [facility] with "AMS" per facility. Pt was 72% RA on EMS arrival, rhonchi all fields and placed on NRB, sats came up to 94%. Hx of diabetes. EXAM: PORTABLE CHEST 1 VIEW COMPARISON:  12/20/2016 FINDINGS: Cardiac silhouette is normal in size. No mediastinal or hilar masses. No evidence of adenopathy. Prominent bronchovascular markings noted in the medial lung bases similar to  prior studies consistent with chronic bronchitic change. Lungs otherwise clear. No pleural effusion or pneumothorax. Skeletal structures are demineralized but grossly intact. IMPRESSION: No active cardiopulmonary disease. Electronically Signed   By: Amie Portland M.D.   On: 01/06/2017 08:46    Procedures Procedures (including critical care time) CRITICAL CARE Performed by: Arby Barrette   Total critical care time: 30 minutes  Critical care time was exclusive of separately billable procedures and treating other patients.  Critical care was necessary to treat or prevent imminent or life-threatening deterioration.  Critical care was time spent personally by me on the following activities: development of treatment plan with patient and/or surrogate as well as nursing, discussions with consultants, evaluation of patient's response to treatment, examination of patient, obtaining history from patient or surrogate, ordering and performing treatments and interventions, ordering and review of laboratory studies, ordering and review of radiographic studies, pulse oximetry and re-evaluation of patient's condition. Medications Ordered in ED Medications  sodium chloride 0.9 % bolus 1,000 mL (not administered)  0.9 %  sodium chloride infusion (not administered)  ceFEPIme (MAXIPIME) 1 g in dextrose 5 % 50 mL IVPB (not administered)  sodium chloride 0.9 % bolus 1,470 mL (not administered)     Initial Impression / Assessment and Plan / ED Course  I have reviewed the triage vital signs and the nursing notes.  Pertinent labs & imaging results that were available during my care of the patient were reviewed by me and considered in my medical decision making (see chart for details).     Consult: Dr. Youlanda Roys for admission.  Final Clinical Impressions(s) / ED Diagnoses   Final diagnoses:  Sepsis, due to unspecified organism [facility])  Severe comorbid illness   The patient is returned to the emergency  department for declining condition. Lactic acid has elevated. Sepsis protocol initiated. Will continue treatment for HCAP per earlier today. Plan for admission New Prescriptions New Prescriptions   No medications on file     Arby Barrette, MD 01/06/17 2335

## 2017-01-06 NOTE — ED Notes (Signed)
While doing rectal temp and changing brief, this RN found Stage 2 sacral decub on patient's buttocks. MD notified, barrier cream and Mepilex foam dressing applied. Pt repositioned on side with pillow

## 2017-01-06 NOTE — Progress Notes (Signed)
Pharmacy Antibiotic Note  [name] [name] is a 66 y.o. male admitted on 01/06/2017 with sepsis w/ unclear etiology, possible PNA, recent hospitalization.  Pharmacy has been consulted for Vancocin and Zosyn dosing.  Plan: Vancomycin 1000mg  x1 then 750mg  IV every 24 hours.  Goal trough 15-20 mcg/mL. Zosyn 3.375g IV q8h (4 hour infusion).  Temp (24hrs), Avg:99.5 F (37.5 C), Min:98.8 F (37.1 C), Max:100.6 F (38.1 C)   Recent Labs Lab 01/06/17 0829 01/06/17 0850 01/06/17 1130 01/06/17 2308 01/06/17 2318  WBC 9.5  --   --  11.1*  --   CREATININE 1.64*  --   --   --   --   LATICACIDVEN  --  1.75 1.46  --  2.16*    Estimated Creatinine Clearance: 32 mL/min (A) (by C-G formula based on SCr of 1.64 mg/dL (H)).    No Known Allergies   Thank you for allowing pharmacy to be a part of this patient's care.  Vernard GamblesVeronda Jaycub Noorani, PharmD, BCPS  01/06/2017 11:57 PM

## 2017-01-06 NOTE — ED Triage Notes (Signed)
Pt in from [facility] with [facility] per facility. Pt was 72% RA on EMS arrival, rhonchi all fields and placed on NRB, sats came up to 94%. SBP in 80's, tachy in 110's, rectal temp 100.6, RR 38/min. Hx of Down's, WC bound and minimal verbalization baseline. 200 ml's NS given by EMS PTA

## 2017-01-06 NOTE — Progress Notes (Signed)
RT made 2 attempts for ABG without success. Left radial artery and left brachial artery. MD aware and is ok without sample.

## 2017-01-06 NOTE — ED Notes (Signed)
Patient transported to CT 

## 2017-01-06 NOTE — ED Triage Notes (Addendum)
Pt arrived via [facility] from [facility] group [facility] @ [address], [place]. [facility]. Pt was discharged at approx 16:00hrs with discharge instructions. Pt is non-verbal. Pt's caretaker states he is not usually slumped as he sits, which he currently is doing. Pt is continually crying out in pain but unable to effectively verbalize complaints. Per ems, pt has sacral ulcer, unable to visualize at time of triage. Pt caretaker at bedside. Ems Vitals  - 124/72, HR 113, Sp02 98% RA.

## 2017-01-06 NOTE — Progress Notes (Signed)
Pharmacy Antibiotic Note  [name] LiasJohn W [name] is a 66 y.o. male admitted from group home on 01/06/2017 with sepsis/UTI.  Pharmacy has been consulted for cefepime dosing dosing.  Patient has AKI with CrCL ~32 ml/min. Afebrile, LA 1.75, WBC WNL   Plan: - Cefepime 1gm IV Q8H - Monitor renal fxn, clinical progress - ?need broader coverage   Height: 5' (152.4 cm) Weight: 108 lb (49 kg) IBW/kg (Calculated) : 50  No data recorded.  No results for input(s): WBC, CREATININE, LATICACIDVEN, VANCOTROUGH, VANCOPEAK, VANCORANDOM, GENTTROUGH, GENTPEAK, GENTRANDOM, TOBRATROUGH, TOBRAPEAK, TOBRARND, AMIKACINPEAK, AMIKACINTROU, AMIKACIN in the last 168 hours.  Estimated Creatinine Clearance: 62.4 mL/min (by C-G formula based on SCr of 0.84 mg/dL).    No Known Allergies   Cefepime 5/20 >>   Leoma Folds D. Laney Potashang, PharmD, BCPS Pager:  [phone] 01/06/2017, 9:47 AM

## 2017-01-06 NOTE — H&P (Signed)
History and Physical    [name] LiasJohn W Alcorn ZOX:[id number] DOB: 1951-08-02 DOA: 01/06/2017  Referring MD/NP/PA:   PCP: Lucretia Fieldoyals, Hoover M   Patient coming from:  The patient is coming from Group home.  At baseline, pt is dependent for most of ADL.   Chief Complaint: worsening mental status and cough  HPI: [name] LiasJohn W [name] is a 66 y.o. male with medical history significant of hypertension, GERD, hypothyroidism, gout, Down syndrome, mental retardation, nonverbal, anemia, chronic kidney disease-stage III, who presents with worsening mental status and cough.  Patient was recently hospitalized from 05/3-5/8 due to sepsis 2/2 UTI and proctitis.  Pt had blood culture with no growth and urine culture positive for pansentitive Ecoli. Pt was discharged on Augmentin at stable condition. Per care giver, pt was noted to be more confused today. He has wet coughing. He seems to have pain, but not sure where the pain is from. No active nausea, vomiting or diarrhea noted. He moves all extremities.   Of note, pt was seen in ED this AM. His initial blood pressure was low which resolved with IVF in ED. He had lactate negative 2 and negative CTA for PE. Pt was suspected to have viral process. He was discharged back to group home at a stable condition. Since pt  continues to have cough, fever and is more confused, pt was brought back to ED for further evaluation and treatment.  ED Course: pt was found to have WBC 9.5, hemoglobin 9.5 which was 9.1 on 5/80/18, lactate 2.16, negative urinalysis, worsening renal function, sodium 147, negative chest x-ray. CT angiogram of chest was negative for PE, but showed secretions in central airways on the right. Hypotension with blood pressure 75/54, which improved to SBP at 90s with IVF. Pt is admitted to stepdown as inpatient. Pt is full code per care giver.  Review of Systems:  could not be reviewed due to nonverbal status.   Allergy: No Known Allergies  Past Medical History:  Diagnosis  Date  . Diabetes mellitus without complication (HCC)   . Down syndrome     No past surgical history on file.Could not be reviewed due to nonverbal status.   Social History:  reports that he has never smoked. He has never used smokeless tobacco. He reports that he does not drink alcohol or use drugs.  Family History: No family history on file. Could not be reviewed due to nonverbal status.   Prior to Admission medications   Medication Sig Start Date End Date Taking? Authorizing Provider  acetaminophen (TYLENOL) 325 MG tablet Take 650 mg by mouth every 4 (four) hours as needed for mild pain, moderate pain, fever or headache.    [provider]  albuterol (PROVENTIL HFA;VENTOLIN HFA) 108 (90 Base) MCG/ACT inhaler Inhale 2 puffs into the lungs every 6 (six) hours as needed for wheezing or shortness of breath.     [provider]  albuterol (PROVENTIL) (2.5 MG/3ML) 0.083% nebulizer solution Take 2.5 mg by nebulization 2 (two) times daily.     [provider]  allopurinol (ZYLOPRIM) 100 MG tablet Take 100 mg by mouth at bedtime.     [provider]  amoxicillin-clavulanate (AUGMENTIN) 875-125 MG tablet Take 1 tablet by mouth 2 (two) times daily. One po bid x 7 days 01/06/17   Mancel BaleWentz, Elliott, MD  aspirin 81 MG chewable tablet Chew 81 mg by mouth daily.    [provider]  clindamycin (CLINDAGEL) 1 % gel Apply 1 application topically daily.  [provider]  fluticasone (FLONASE) 50 MCG/ACT nasal spray Place 1 spray into both nostrils daily.     [provider]  hydrocortisone cream 1 % Apply 1 application topically 4 (four) times daily as needed for itching (and/or bug bites).    [provider]  lactulose (CHRONULAC) 10 GM/15ML solution Take by mouth daily.     [provider]  levothyroxine (SYNTHROID, LEVOTHROID) 25 MCG tablet Take 25 mcg by mouth daily before breakfast.    [provider]  Melatonin 3 MG  TABS Take 3 mg by mouth at bedtime.    [provider]  Memantine HCl-Donepezil HCl (NAMZARIC) 28-10 MG CP24 Take 1 capsule by mouth daily.     [provider]  mineral oil-hydrophilic petrolatum (AQUAPHOR) ointment Apply 1 application topically 2 (two) times daily. Pt applies to hands and affected skin.    [provider]  potassium chloride (K-DUR) 10 MEQ tablet Take 1 tablet (10 mEq total) by mouth daily. 12/25/16   Albertine Grates, MD  pravastatin (PRAVACHOL) 20 MG tablet Take 20 mg by mouth at bedtime.     [provider]  ranitidine (ZANTAC) 150 MG tablet Take 150 mg by mouth 2 (two) times daily.    [provider]  tamsulosin (FLOMAX) 0.4 MG CAPS capsule Take 0.4 mg by mouth daily.    [provider]    Physical Exam: Vitals:   01/06/17 2341 01/06/17 2344 01/07/17 0000 01/07/17 0017  BP:   (!) 63/42 92/60  Pulse:   75 85  Resp:   (!) 22 19  SpO2: 96% 99% 93%    General: Not in acute distress HEENT:       Eyes: PERRL, EOMI, no scleral icterus.       ENT: No discharge from the ears and nose, no pharynx injection, no tonsillar enlargement.        Neck: No JVD, no bruit, no mass felt. Heme: No neck lymph node enlargement. Cardiac: S1/S2, RRR, No murmurs, No gallops or rubs. Respiratory: has rhonchi bilaterally.  GI: Soft, nondistended, nontender, no organomegaly, BS present. GU: No hematuria Ext: No pitting leg edema bilaterally. 2+DP/PT pulse bilaterally. Musculoskeletal: No joint deformities, No joint redness or warmth, no limitation of ROM in spin. Skin: has sacral ulcer-stage III Neuro: nonverbal, not  oriented X3, cranial nerves II-XII grossly intact, moves all extremities normally Psych: Patient is not psychotic.  Labs on Admission: I have personally reviewed following labs and imaging studies  CBC:  Recent Labs Lab 01/06/17 0829 01/06/17 2308  WBC 9.5 11.1*  NEUTROABS 6.7 9.5*  HGB 9.5* 12.8*  HCT 30.1* 41.0  MCV 95.3  94.5  PLT 376 268   Basic Metabolic Panel:  Recent Labs Lab 01/06/17 0829  NA 147*  K 4.1  CL 114*  CO2 25  GLUCOSE 128*  BUN 27*  CREATININE 1.64*  CALCIUM 8.9   GFR: Estimated Creatinine Clearance: 32 mL/min (A) (by C-G formula based on SCr of 1.64 mg/dL (H)). Liver Function Tests:  Recent Labs Lab 01/06/17 0829  AST 27  ALT 14*  ALKPHOS 65  BILITOT 0.6  PROT 6.0*  ALBUMIN 2.0*   No results for input(s): LIPASE, AMYLASE in the last 168 hours. No results for input(s): AMMONIA in the last 168 hours. Coagulation Profile: No results for input(s): INR, PROTIME in the last 168 hours. Cardiac Enzymes: No results for input(s): CKTOTAL, CKMB, CKMBINDEX, TROPONINI in the last 168 hours. BNP (last 3 results) No results  for input(s): PROBNP in the last 8760 hours. HbA1C: No results for input(s): HGBA1C in the last 72 hours. CBG:  Recent Labs Lab 01/06/17 2220  GLUCAP 157*   Lipid Profile: No results for input(s): CHOL, HDL, LDLCALC, TRIG, CHOLHDL, LDLDIRECT in the last 72 hours. Thyroid Function Tests: No results for input(s): TSH, T4TOTAL, FREET4, T3FREE, THYROIDAB in the last 72 hours. Anemia Panel: No results for input(s): VITAMINB12, FOLATE, FERRITIN, TIBC, IRON, RETICCTPCT in the last 72 hours. Urine analysis:    Component Value Date/Time   COLORURINE YELLOW 01/06/2017 1118   APPEARANCEUR CLEAR 01/06/2017 1118   LABSPEC 1.015 01/06/2017 1118   PHURINE 5.0 01/06/2017 1118   GLUCOSEU NEGATIVE 01/06/2017 1118   GLUCOSEU NEGATIVE 05/04/2013 1112   HGBUR MODERATE (A) 01/06/2017 1118   BILIRUBINUR NEGATIVE 01/06/2017 1118   KETONESUR NEGATIVE 01/06/2017 1118   PROTEINUR NEGATIVE 01/06/2017 1118   UROBILINOGEN 0.2 01/01/2015 1315   NITRITE NEGATIVE 01/06/2017 1118   LEUKOCYTESUR NEGATIVE 01/06/2017 1118   Sepsis Labs: @LABRCNTIP (procalcitonin:4,lacticidven:4) ) Recent Results (from the past 240 hour(s))  Culture, blood (routine x 2)     Status: None  (Preliminary result)   Collection Time: 01/06/17 11:00 PM  Result Value Ref Range Status   Specimen Description BLOOD RIGHT WRIST  Final   Special Requests   Final    BOTTLES DRAWN AEROBIC AND ANAEROBIC Blood Culture adequate volume   Culture PENDING  Incomplete   Report Status PENDING  Incomplete  Culture, blood (routine x 2)     Status: None (Preliminary result)   Collection Time: 01/06/17 11:08 PM  Result Value Ref Range Status   Specimen Description BLOOD RIGHT HAND  Final   Special Requests IN PEDIATRIC BOTTLE Blood Culture adequate volume  Final   Culture PENDING  Incomplete   Report Status PENDING  Incomplete     Radiological Exams on Admission: Ct Angio Chest Pe W/cm &/or Wo Cm  Result Date: 01/06/2017 CLINICAL DATA:  Pt Hx of Down's, WC bound and minimal verbalization baseline. Today has SOB Also patients arms are contracted and cannot be straightened either above the head or to the side EXAM: CT ANGIOGRAPHY CHEST WITH CONTRAST TECHNIQUE: Multidetector CT imaging of the chest was performed using the standard protocol during bolus administration of intravenous contrast. Multiplanar CT image reconstructions and MIPs were obtained to evaluate the vascular anatomy. CONTRAST:  Isovue 370 80ml COMPARISON:  12/24/2016 FINDINGS: Cardiovascular: Satisfactory opacification of pulmonary arteries noted, and there is no evidence of pulmonary emboli. Incomplete opacification of the thoracic aorta with no suggestion of aneurysm, dissection, or stenosis. Mediastinum/Nodes: No enlarged mediastinal, hilar, or axillary lymph nodes. Thyroid gland, trachea, and esophagus demonstrate no significant findings. No pericardial effusion. Lungs/Pleura: No pleural effusion. No pneumothorax. Secretions in central right bronchial tree. Lungs are clear. Motion degrades images through the lung bases. Upper Abdomen: Cholecystectomy clips.  No acute findings. Musculoskeletal: Anterior vertebral endplate spurring at  multiple levels in the mid thoracic spine. Advanced degenerative changes in bilateral shoulders. No fracture or worrisome bone lesion. Review of the MIP images confirms the above findings. IMPRESSION: 1. Negative for acute PE. 2. Secretions in central airways on the right. No focal pulmonary parenchymal process. Electronically Signed   By: Corlis Leak M.D.   On: 01/06/2017 14:01   Dg Chest Port 1 View  Result Date: 01/06/2017 CLINICAL DATA:  SOB. Pt in from [facility] group home via [facility] with "AMS" per facility. Pt was 72% RA on EMS arrival, rhonchi all fields and  placed on NRB, sats came up to 94%. Hx of diabetes. EXAM: PORTABLE CHEST 1 VIEW COMPARISON:  12/20/2016 FINDINGS: Cardiac silhouette is normal in size. No mediastinal or hilar masses. No evidence of adenopathy. Prominent bronchovascular markings noted in the medial lung bases similar to prior studies consistent with chronic bronchitic change. Lungs otherwise clear. No pleural effusion or pneumothorax. Skeletal structures are demineralized but grossly intact. IMPRESSION: No active cardiopulmonary disease. Electronically Signed   By: Amie Portland M.D.   On: 01/06/2017 08:46     EKG: Independently reviewed. Sinus rhythm, QTC 520, old right bundle blockage  Assessment/Plan Principal Problem:   Sepsis (HCC) Active Problems:   DOWNS SYNDROME   Pure hypercholesterolemia   Anemia   Acute metabolic encephalopathy   GERD (gastroesophageal reflux disease)   Acute renal failure superimposed on stage 3 chronic kidney disease (HCC)   Aspiration pneumonia (HCC)   Sacral decubitus ulcer, stage III (HCC)   Sepsis (HCC): Etiology is not clear. Urinalysis negative. CT angiogram is negative for PE, but showed scretions in central airways on the right. He has wet coughing, indicating pt may have aspiration pneumonia versus early stage of HCAP. Pt is septic with hypotension, elevated lactic acid 2.16, fever and tachypnea.  - Will admit to SDU as inpt. - IV  Vancomycin and Zosyn  - Mucinex for cough  - prn Albuterol Nebs prn for SOB - Urine legionella and S. pneumococcal antigen - Follow up blood culture x2, sputum culture - will get Procalcitonin and trend lactic acid level per sepsis protocol - IVF: 30 cc/Kg and followed by 125 mL per hour of NS  - SLP  Sacral ulcer-stage III: -Consult to Wound Care  DOWNS SYNDROME and dementia: nonverbal. -continue home Namzaric\  Pure hypercholesterolemia: -Pravastatin  Normocytic anemia: Hemoglobin stable. Hemoglobin 9.1 on 12/25/16, 9.5 on admission -Follow-up by CBC  GERD: -protonix  Acute metabolic encephalopathy: Most likely due to sepsis -Frequent neuro check -Treat underlying sepsis as above  AoCKD-III: Baseline Cre is 1.0 at discharge, pt's Cre is  1.64 on admission. Likely due to prerenal secondary to dehydration - IVF as above - Check FeNa - Follow up renal function by BMP  DVT ppx: SQ lovenox Code Status: Full code (per care giver, pt is full code) Family Communication: Yes, patient's care giver at bed side Disposition Plan:  Anticipate discharge back to previous group home environment Consults called:  none Admission status:  SDU/inpation       Date of Service 01/07/2017    Lorretta Harp [facility] Pager [phone]  If 7PM-7AM, please contact night-coverage [email or web address] Password [facility] 01/07/2017, 12:53 AM

## 2017-01-06 NOTE — Discharge Instructions (Signed)
You have a low-grade fever, and it is not clear what it is caused by.  You may have a viral illness causing some congestion, and making it hard to breathe.  It is important to drink a lot of water to improve your respiratory function.  You have a sacral decubitus, which may be causing some problems as well.  We are going to prescribe an antibiotic to see if that helps heal it.  The wound will need to be addressed, by nursing and have dressing changes every 2 or 3 days.  Take your temperature, twice a day to keep an eye on things.  Try to eat 3 meals each day, to improve your ability to heal, and get better quicker.  Return here, if needed, for problems.

## 2017-01-07 ENCOUNTER — Encounter (HOSPITAL_COMMUNITY): Payer: Self-pay | Admitting: *Deleted

## 2017-01-07 DIAGNOSIS — L89153 Pressure ulcer of sacral region, stage 3: Secondary | ICD-10-CM

## 2017-01-07 DIAGNOSIS — G9341 Metabolic encephalopathy: Secondary | ICD-10-CM

## 2017-01-07 LAB — URINE CULTURE: CULTURE: NO GROWTH

## 2017-01-07 LAB — SODIUM, URINE, RANDOM: SODIUM UR: 117 mmol/L

## 2017-01-07 LAB — STREP PNEUMONIAE URINARY ANTIGEN: Strep Pneumo Urinary Antigen: NEGATIVE

## 2017-01-07 LAB — LACTIC ACID, PLASMA
LACTIC ACID, VENOUS: 1.6 mmol/L (ref 0.5–1.9)
Lactic Acid, Venous: 1.9 mmol/L (ref 0.5–1.9)

## 2017-01-07 LAB — PROCALCITONIN: Procalcitonin: 0.49 ng/mL

## 2017-01-07 LAB — PROTIME-INR
INR: 1.45
Prothrombin Time: 17.8 seconds — ABNORMAL HIGH (ref 11.4–15.2)

## 2017-01-07 LAB — MRSA PCR SCREENING: MRSA BY PCR: NEGATIVE

## 2017-01-07 LAB — CREATININE, URINE, RANDOM: CREATININE, URINE: 80.99 mg/dL

## 2017-01-07 MED ORDER — ALBUTEROL SULFATE (2.5 MG/3ML) 0.083% IN NEBU: 2.5000 mg | INHALATION_SOLUTION | RESPIRATORY_TRACT | Status: DC | PRN

## 2017-01-07 MED ORDER — SODIUM CHLORIDE 0.9 % IV SOLN
INTRAVENOUS | Status: DC
  Administered 2017-01-07 – 2017-01-09 (×5): via INTRAVENOUS

## 2017-01-07 MED ORDER — VANCOMYCIN HCL IN DEXTROSE 1-5 GM/200ML-% IV SOLN
1000.0000 mg | Freq: Once | INTRAVENOUS | Status: AC
  Administered 2017-01-07: 1000 mg via INTRAVENOUS
  Filled 2017-01-07: qty 200

## 2017-01-07 MED ORDER — VANCOMYCIN HCL IN DEXTROSE 750-5 MG/150ML-% IV SOLN
750.0000 mg | INTRAVENOUS | Status: DC
  Filled 2017-01-07: qty 150

## 2017-01-07 MED ORDER — RESOURCE THICKENUP CLEAR PO POWD
ORAL | Status: DC | PRN
  Filled 2017-01-07: qty 125

## 2017-01-07 MED ORDER — PIPERACILLIN-TAZOBACTAM 3.375 G IVPB 30 MIN
3.3750 g | Freq: Once | INTRAVENOUS | Status: AC
  Administered 2017-01-07: 3.375 g via INTRAVENOUS
  Filled 2017-01-07: qty 50

## 2017-01-07 MED ORDER — PIPERACILLIN-TAZOBACTAM 3.375 G IVPB
3.3750 g | Freq: Three times a day (TID) | INTRAVENOUS | Status: DC
  Administered 2017-01-07 – 2017-01-16 (×29): 3.375 g via INTRAVENOUS
  Filled 2017-01-07 (×31): qty 50

## 2017-01-07 MED ORDER — MEMANTINE HCL ER 28 MG PO CP24
28.0000 mg | ORAL_CAPSULE | Freq: Every day | ORAL | Status: DC
  Administered 2017-01-07 – 2017-01-15 (×9): 28 mg via ORAL
  Filled 2017-01-07 (×10): qty 1

## 2017-01-07 MED ORDER — CLINDAMYCIN PHOSPHATE 1 % EX GEL
1.0000 "application " | Freq: Every day | CUTANEOUS | Status: DC
  Administered 2017-01-07 – 2017-01-16 (×10): 1 via TOPICAL
  Filled 2017-01-07: qty 30

## 2017-01-07 MED ORDER — PANTOPRAZOLE SODIUM 40 MG PO TBEC
40.0000 mg | DELAYED_RELEASE_TABLET | Freq: Every day | ORAL | Status: DC
  Administered 2017-01-07 – 2017-01-16 (×10): 40 mg via ORAL
  Filled 2017-01-07 (×10): qty 1

## 2017-01-07 MED ORDER — SODIUM CHLORIDE 0.9 % IV BOLUS (SEPSIS)
500.0000 mL | Freq: Once | INTRAVENOUS | Status: AC
  Administered 2017-01-07: 500 mL via INTRAVENOUS

## 2017-01-07 MED ORDER — DONEPEZIL HCL 10 MG PO TABS
10.0000 mg | ORAL_TABLET | Freq: Every day | ORAL | Status: DC
  Administered 2017-01-07 – 2017-01-15 (×9): 10 mg via ORAL
  Filled 2017-01-07 (×9): qty 1

## 2017-01-07 NOTE — Consult Note (Signed)
WOC Nurse wound consult note Reason for Consult: Consult requested for sacrum and buttocks.  Pt is very thin and immobile Wound type: Dark purple deep tissue injuries  Pressure Injury POA: Yes Measurement: Sacrum 4X4cm; Left buttock 4X2cm Drainage (amount, consistency, odor)  No odor or drainage Periwound: Intact skin surrounding Dressing procedure/placement/frequency: No family present to discuss plan of care.  Deep tissue injuries are high risk to evolve into unstageable wound or full thickness tissue loss within 7-10 days.  Foam dressing in place to protect from further injury.  Air mattress to reduce pressure. Please re-consult if further assistance is needed.  Thank-you,  Cammie Mcgeeawn Hang Ammon MSN, RN, CWOCN, [place]onderosa[place]CN-AP, CNS [phone]

## 2017-01-07 NOTE — Progress Notes (Addendum)
Admitting Dr. Clyde LundborgNiu paged about patients low blood pressures and possible need for pressors which would require a higher level of care than stepdown.  MD stated he would call the ED and place a hold on the transfer til blood pressures are more stable.  Vital signs were validated so MD could see result (with a note of not validated placed on the comments).  He was told to call  and discuss with the ED RN and MD since his current and previous visit to ED department earlier today as far as stability to be placed on a stepdown unit.

## 2017-01-07 NOTE — Progress Notes (Signed)
TEAM 1 - Stepdown/ICU TEAM  Roque LiasJohn W Morello  ZOX:[id number] DOB: [birth year] DOA: 01/06/2017 PCP: Lucretia Fieldoyals, Hoover M    Brief Narrative:  66 y.o. male with history of hypertension, GERD, hypothyroidism, gout, Down syndrome, nonverbal, anemia, CKD stage III, who presented with worsening mental status and cough.  Patient was hospitalized 05/3 > 5/8 due to sepsis 2/2 UTI and proctitis.  Pt had blood culture with no growth and urine culture positive for pansentitive Ecoli. Pt was discharged on Augmentin.   Subjective: The patient is awake but noncommunicative as appears to be his baseline.  He does not appear to be in any acute distress.  He is laying in the left lateral decubitus position and nearly in a fetal position.  Assessment & Plan:  Sepsis likely due to aspiration pneumonia/pneumonitis Continue empiric antibiotic for planned 3 days of treatment and monitor clinically - SLP evaluation to determine if there is a consistency that is safer but would not feel that a PEG tube would be appropriate in this gentleman who would not be able to comprehend why he was not allowed to eat - wean O2 support as able   Stage III sacral ulcer Care as per WOC RN   Chronic hypotension Baseline SBP appears to be in the 90s per review of old records   Downs syndrome / Dementia   Acute renal injury on chronic kidney disease stage III Baseline creatinine 1.0  Recent Labs Lab 01/06/17 0829  CREATININE 1.64*   Chronic normocytic anemia No evidence of acute blood loss - baseline hemoglobin is approximately 10  Moderate malnutrition in context of chronic illness  DVT prophylaxis: Lovenox  Code Status: FULL CODE Family Communication: no family present at time of exam  Disposition Plan: SDU - eventual return to group home   Consultants:  none  Procedures: none  Antimicrobials:  Zosyn 5/20 > Vancomycin 5/20 Cefepime 5/20  Objective: Blood pressure (!) 86/39, pulse 72, temperature 99.1 F  (37.3 C), temperature source Oral, resp. rate 14, height 5' (1.524 m), weight 49.2 kg (108 lb 7.5 oz), SpO2 (!) 89 %.  Intake/Output Summary (Last 24 hours) at 01/07/17 1601 Last data filed at 01/07/17 0400  Gross per 24 hour  Intake          2113.75 ml  Output                0 ml  Net          2113.75 ml   Filed Weights   01/07/17 0246  Weight: 49.2 kg (108 lb 7.5 oz)    Examination: General: No acute respiratory distress Lungs: Coarse upper airway sounds transmitted throughout all fields with no wheezing  Cardiovascular: Regular rate and rhythm without murmur gallop or rub normal S1 and S2 Abdomen: Nontender, nondistended, soft, bowel sounds positive, no rebound, no ascites, no appreciable mass Extremities: No significant cyanosis, clubbing, or edema bilateral lower extremities  CBC:  Recent Labs Lab 01/06/17 0829 01/06/17 2308  WBC 9.5 11.1*  NEUTROABS 6.7 9.5*  HGB 9.5* 12.8*  HCT 30.1* 41.0  MCV 95.3 94.5  PLT 376 268   Basic Metabolic Panel:  Recent Labs Lab 01/06/17 0829  NA 147*  K 4.1  CL 114*  CO2 25  GLUCOSE 128*  BUN 27*  CREATININE 1.64*  CALCIUM 8.9   GFR: Estimated Creatinine Clearance: 32.1 mL/min (A) (by C-G formula based on SCr of 1.64 mg/dL (H)).  Liver Function Tests:  Recent Labs Lab 01/06/17 [id number]  AST 27  ALT 14*  ALKPHOS 65  BILITOT 0.6  PROT 6.0*  ALBUMIN 2.0*    Coagulation Profile:  Recent Labs Lab 01/07/17 0023  INR 1.45   HbA1C: Hemoglobin A1C  Date/Time Value Ref Range Status  09/13/2016 5.5  Final   Hgb A1c MFr Bld  Date/Time Value Ref Range Status  12/21/2016 04:09 AM 6.1 (H) 4.8 - 5.6 % Final    Comment:    (NOTE)         Pre-diabetes: 5.7 - 6.4         Diabetes: >6.4         Glycemic control for adults with diabetes: <7.0   04/04/2016 11:18 AM 5.7 4.6 - 6.5 % Final    Comment:    Glycemic Control Guidelines for People with Diabetes:Non Diabetic:  <6%Goal of Therapy: <7%Additional Action Suggested:   >8%     CBG:  Recent Labs Lab 01/06/17 2220  GLUCAP 157*    Recent Results (from the past 240 hour(s))  Blood Culture (routine x 2)     Status: None (Preliminary result)   Collection Time: 01/06/17  8:34 AM  Result Value Ref Range Status   Specimen Description BLOOD RIGHT ANTECUBITAL  Final   Special Requests   Final    BOTTLES DRAWN AEROBIC AND ANAEROBIC Blood Culture adequate volume   Culture NO GROWTH < 24 HOURS  Final   Report Status PENDING  Incomplete  Blood Culture (routine x 2)     Status: None (Preliminary result)   Collection Time: 01/06/17  8:44 AM  Result Value Ref Range Status   Specimen Description BLOOD LEFT HAND  Final   Special Requests   Final    BOTTLES DRAWN AEROBIC ONLY Blood Culture adequate volume   Culture NO GROWTH < 24 HOURS  Final   Report Status PENDING  Incomplete  Urine culture     Status: None   Collection Time: 01/06/17 11:18 AM  Result Value Ref Range Status   Specimen Description URINE, RANDOM  Final   Special Requests NONE  Final   Culture NO GROWTH  Final   Report Status 01/07/2017 FINAL  Final  Culture, blood (routine x 2)     Status: None (Preliminary result)   Collection Time: 01/06/17 11:00 PM  Result Value Ref Range Status   Specimen Description BLOOD RIGHT WRIST  Final   Special Requests   Final    BOTTLES DRAWN AEROBIC AND ANAEROBIC Blood Culture adequate volume   Culture PENDING  Incomplete   Report Status PENDING  Incomplete  Culture, blood (routine x 2)     Status: None (Preliminary result)   Collection Time: 01/06/17 11:08 PM  Result Value Ref Range Status   Specimen Description BLOOD RIGHT HAND  Final   Special Requests IN PEDIATRIC BOTTLE Blood Culture adequate volume  Final   Culture PENDING  Incomplete   Report Status PENDING  Incomplete  MRSA PCR Screening     Status: None   Collection Time: 01/07/17  2:45 AM  Result Value Ref Range Status   MRSA by PCR NEGATIVE NEGATIVE Final    Comment:        The GeneXpert  MRSA Assay (FDA approved for NASAL specimens only), is one component of a comprehensive MRSA colonization surveillance program. It is not intended to diagnose MRSA infection nor to guide or monitor treatment for MRSA infections.      Scheduled Meds: . allopurinol  100 mg Oral QHS  . aspirin  81 mg Oral Daily  . clindamycin  1 application Topical Daily  . memantine  28 mg Oral QHS   And  . donepezil  10 mg Oral QHS  . enoxaparin (LOVENOX) injection  30 mg Subcutaneous Q24H  . fluticasone  1 spray Each Nare Daily  . levothyroxine  25 mcg Oral QAC breakfast  . Melatonin  3 mg Oral QHS  . mineral oil-hydrophilic petrolatum  1 application Topical BID  . pantoprazole  40 mg Oral Q1200  . pravastatin  20 mg Oral QHS  . tamsulosin  0.4 mg Oral Daily     LOS: 1 day   Lonia Blood, MD [facility] Office  [phone] Pager - Text Page per Amion as per below:  On-Call/Text Page:      [email or web address]      password [email or web address]  If 7PM-7AM, please contact night-coverage [email or web address] Password [email or web address] 01/07/2017, 4:01 PM

## 2017-01-07 NOTE — Plan of Care (Signed)
Problem: Fluid Volume: Goal: Hemodynamic stability will improve Outcome: Progressing Waiting for IV team to get a new IV for fluid resesuctation and informed patient with no teach back displayed at this time.

## 2017-01-07 NOTE — Plan of Care (Signed)
Problem: Fluid Volume: Goal: Hemodynamic stability will improve Outcome: Progressing Discussed with patient as to him receiving ivf with no teach back displayed at this time.  Comments: Paged MD about low blood pressures

## 2017-01-07 NOTE — Progress Notes (Signed)
Initial Nutrition Assessment  DOCUMENTATION CODES:   Non-severe (moderate) malnutrition in context of chronic illness  INTERVENTION:    Mighty Shake II TID with meals, each supplement provides 480-500 kcals and 20-23 grams of protein  NUTRITION DIAGNOSIS:   Malnutrition (moderate) related to chronic illness (Down's syndrome, dementia) as evidenced by moderate depletion of body fat, moderate depletions of muscle mass  GOAL:   Patient will meet greater than or equal to 90% of their needs  MONITOR:   PO intake, Supplement acceptance, Labs, Weight trends, Skin, I & O's  REASON FOR ASSESSMENT:   Low Braden  ASSESSMENT:    66 y.o. Male with medical history significant of hypertension, GERD, hypothyroidism, gout, Down syndrome, mental retardation, nonverbal, anemia, chronic kidney disease-stage III, who presents with worsening mental status and cough.  RD unable to obtain nutrition hx.  Pt is non-verbal. S/p bedside swallow evaluation today.  Advanced to Dys 1-nectar thick liquid diet. CWOCN note reviewed.  Low braden score places patient at risk for further skin breakdown. Labs and medications reviewed. CBG 157.  Nutrition-Focused physical exam completed. Findings are moderate fat depletion, moderate muscle depletion, and no edema.   Diet Order:  DIET - DYS 1 Room service appropriate? Yes; Fluid consistency: Nectar Thick  Skin:  Wound (see comment) (dark purple deep tissue injuries to sacrum & buttock)  Last BM:  5/21  Height:   Ht Readings from Last 1 Encounters:  01/07/17 5' (1.524 m)   Weight:   Wt Readings from Last 1 Encounters:  01/07/17 108 lb 7.5 oz (49.2 kg)   Ideal Body Weight:  45 kg  BMI:  Body mass index is 21.18 kg/m.  Estimated Nutritional Needs:   Kcal:  1400-1600  Protein:  65-75 gm  Fluid:  >/= 1.5 L  EDUCATION NEEDS:   No education needs identified at this time  Maureen ChattersKatie Tawan Degroote, RD, LDN Pager #: [phone] After-Hours Pager #:  [phone]

## 2017-01-07 NOTE — Evaluation (Signed)
Clinical/Bedside Swallow Evaluation Patient Details  Name: [name] [name] MRN: [id number] Date of Birth: [birth year]  Today's Date: 01/07/2017 Time: SLP Start Time (ACUTE ONLY): 1140 SLP Stop Time (ACUTE ONLY): 1156 SLP Time Calculation (min) (ACUTE ONLY): 16 min  Past Medical History:  Past Medical History:  Diagnosis Date  . Diabetes mellitus without complication (HCC)   . Down syndrome    Past Surgical History: History reviewed. No pertinent surgical history. HPI:  [name] [name] a 66 y.o.malewith a primary medical h/o mental retardation/dementia from a group home, baseline wheelchair bound, total care, is brought to [facility] ED due to more lethargic than normal. Diet at group home puree, thin liquids. ST to evalaute swallow function.   Assessment / Plan / Recommendation Clinical Impression  Pt demonstrates oral dysphagia and concern for probable oropharyngeal dysphagia and possible aspiration of liquids. Pts oral mucosa is severely dru at baseline, improved with oral care and water trials. Pt struggled with labial seal on cup with anterior spillage, did no understand use of straw and so spoon sips were trials and most successful. Oral transit impaired with excessive lingual trhusting, with noticably deviated tongue to the left. Delayed congested coughing observed after thin liquids, Pt tolerated nectar and puree well. Will intiaite dys 1 (puree) and nectar thick liquids and observe for tolerance. Pt would benefit from MBS, but participation would be unreliable.  Will follow for readiness.  SLP Visit Diagnosis: Dysphagia, oropharyngeal phase (R13.12)    Aspiration Risk  Severe aspiration risk;Moderate aspiration risk    Diet Recommendation Dysphagia 1 (Puree);Nectar-thick liquid   Liquid Administration via: Spoon Medication Administration: Crushed with puree Supervision: Full supervision/cueing for compensatory strategies Compensations: Slow rate;Small sips/bites Postural Changes: Seated  upright at 90 degrees    Other  Recommendations Oral Care Recommendations: Oral care BID Other Recommendations: Order thickener from pharmacy   Follow up Recommendations Skilled Nursing facility      Frequency and Duration min 2x/week  2 weeks       Prognosis Prognosis for Safe Diet Advancement: Fair      Swallow Study   General HPI: [name] [name] a 66 y.o.malewith a primary medical h/o mental retardation/dementia from a group home, baseline wheelchair bound, total care, is brought to [facility] ED due to more lethargic than normal. Diet at group home puree, thin liquids. ST to evalaute swallow function. Type of Study: Bedside Swallow Evaluation Previous Swallow Assessment: see HPI Diet Prior to this Study: NPO Temperature Spikes Noted: No Respiratory Status: Room air History of Recent Intubation: No Behavior/Cognition: Alert;Distractible;Doesn't follow directions Oral Cavity Assessment: Dry;Dried secretions Oral Care Completed by SLP: Yes Oral Cavity - Dentition: Poor condition Vision: Impaired for self-feeding Self-Feeding Abilities: Total assist Patient Positioning: Upright in bed Baseline Vocal Quality: Normal Volitional Cough: Cognitively unable to elicit (involuntary cough very congested) Volitional Swallow: Unable to elicit    Oral/Motor/Sensory Function Overall Oral Motor/Sensory Function: Moderate impairment Lingual Symmetry: Abnormal symmetry left   Ice Chips Ice chips: Not tested   Thin Liquid Thin Liquid: Impaired Presentation: Cup Oral Phase Impairments: Reduced lingual movement/coordination Pharyngeal  Phase Impairments: Cough - Delayed    Nectar Thick Nectar Thick Liquid: Impaired Presentation: Spoon Oral Phase Impairments: Reduced lingual movement/coordination   Honey Thick Honey Thick Liquid: Not tested   Puree Puree: Impaired Presentation: Spoon Oral Phase Impairments: Reduced lingual movement/coordination   Solid   GO   Solid: Not tested        [name] Ditty, MA CCC-SLP [phone]  [name] [name], [name] [name]  [name] [name] 01/07/2017,1:27 PM

## 2017-01-08 DIAGNOSIS — I9589 Other hypotension: Secondary | ICD-10-CM

## 2017-01-08 DIAGNOSIS — D649 Anemia, unspecified: Secondary | ICD-10-CM

## 2017-01-08 DIAGNOSIS — E440 Moderate protein-calorie malnutrition: Secondary | ICD-10-CM

## 2017-01-08 DIAGNOSIS — F0391 Unspecified dementia with behavioral disturbance: Secondary | ICD-10-CM

## 2017-01-08 LAB — COMPREHENSIVE METABOLIC PANEL
ALK PHOS: 70 U/L (ref 38–126)
ALT: 13 U/L — ABNORMAL LOW (ref 17–63)
ALT: 12 U/L — ABNORMAL LOW (ref 17–63)
AST: 24 U/L (ref 15–41)
AST: 21 U/L (ref 15–41)
Albumin: 1.6 g/dL — ABNORMAL LOW (ref 3.5–5.0)
Albumin: 1.4 g/dL — ABNORMAL LOW (ref 3.5–5.0)
Alkaline Phosphatase: 74 U/L (ref 38–126)
Anion gap: 7 (ref 5–15)
Anion gap: 6 (ref 5–15)
BILIRUBIN TOTAL: 0.9 mg/dL (ref 0.3–1.2)
BUN: 17 mg/dL (ref 6–20)
BUN: 16 mg/dL (ref 6–20)
CALCIUM: 7.6 mg/dL — AB (ref 8.9–10.3)
CHLORIDE: 120 mmol/L — AB (ref 101–111)
CO2: 21 mmol/L — AB (ref 22–32)
CO2: 24 mmol/L (ref 22–32)
CREATININE: 1.23 mg/dL (ref 0.61–1.24)
CREATININE: 1.26 mg/dL — AB (ref 0.61–1.24)
Calcium: 7.6 mg/dL — ABNORMAL LOW (ref 8.9–10.3)
Chloride: 121 mmol/L — ABNORMAL HIGH (ref 101–111)
GFR calc Af Amer: >60 mL/min (ref >60)
GFR calc non Af Amer: >60 mL/min (ref >60)
GFR, EST NON AFRICAN AMERICAN: 59 mL/min — AB (ref >60)
Glucose, Bld: 117 mg/dL — ABNORMAL HIGH (ref 65–99)
Glucose, Bld: 122 mg/dL — ABNORMAL HIGH (ref 65–99)
POTASSIUM: 3.6 mmol/L (ref 3.5–5.1)
Potassium: 3.4 mmol/L — ABNORMAL LOW (ref 3.5–5.1)
Sodium: 148 mmol/L — ABNORMAL HIGH (ref 135–145)
Sodium: 151 mmol/L — ABNORMAL HIGH (ref 135–145)
TOTAL PROTEIN: 4.8 g/dL — AB (ref 6.5–8.1)
Total Bilirubin: 0.9 mg/dL (ref 0.3–1.2)
Total Protein: 5 g/dL — ABNORMAL LOW (ref 6.5–8.1)

## 2017-01-08 LAB — CBC
HEMATOCRIT: 23.3 % — AB (ref 39.0–52.0)
Hemoglobin: 7.3 g/dL — ABNORMAL LOW (ref 13.0–17.0)
MCH: 29.7 pg (ref 26.0–34.0)
MCHC: 31.3 g/dL (ref 30.0–36.0)
MCV: 94.7 fL (ref 78.0–100.0)
Platelets: 273 10*3/uL (ref 150–400)
RBC: 2.46 MIL/uL — ABNORMAL LOW (ref 4.22–5.81)
RDW: 16.3 % — AB (ref 11.5–15.5)
WBC: 16.5 10*3/uL — ABNORMAL HIGH (ref 4.0–10.5)

## 2017-01-08 LAB — TSH: TSH: 3.824 u[IU]/mL (ref 0.350–4.500)

## 2017-01-08 MED ORDER — MIDODRINE HCL 5 MG PO TABS
2.5000 mg | ORAL_TABLET | Freq: Two times a day (BID) | ORAL | Status: DC
  Administered 2017-01-08 – 2017-01-16 (×16): 2.5 mg via ORAL
  Filled 2017-01-08 (×16): qty 1

## 2017-01-08 MED ORDER — ALBUMIN HUMAN 25 % IV SOLN
25.0000 g | Freq: Four times a day (QID) | INTRAVENOUS | Status: AC
  Administered 2017-01-08 – 2017-01-09 (×6): 25 g via INTRAVENOUS
  Filled 2017-01-08: qty 100
  Filled 2017-01-08: qty 50
  Filled 2017-01-08: qty 100
  Filled 2017-01-08 (×4): qty 50
  Filled 2017-01-08 (×2): qty 100

## 2017-01-08 NOTE — Progress Notes (Signed)
  Speech Language Pathology Treatment: Dysphagia  Patient Details Name: [name] [name] MRN: [id number] DOB: July 21, 1951 Today's Date: 01/08/2017 Time: [id number] SLP Time Calculation (min) (ACUTE ONLY): 8 min  Assessment / Plan / Recommendation Clinical Impression  After oral care, with max verbal and tactile cues, pt did not respond to teaspoon sips of nectar thick liquids; too lethargic, liquids suctioned from mouth. RN reports he tolerated am meal well. [facility] assessment of swallowing.   HPI HPI: Loralee PacasJohn W Iveyis a 66 y.o.malewith a primary medical h/o mental retardation/dementia from a group home, baseline wheelchair bound, total care, [facility] to more lethargic than normal. Diet at group home puree, thin liquids. ST to evalaute swallow function.      SLP Plan  Continue with current plan of care;MBS       Recommendations  Diet recommendations: Dysphagia 1 (puree);Nectar-thick liquid Liquids provided via: Cup;Straw Medication Administration: Crushed with puree Supervision: Full supervision/cueing for compensatory strategies Compensations: Slow rate;Small sips/bites                Plan: Continue with current plan of care;MBS       GO               Harlon DittyBonnie Juleen Sorrels, MA CCC-SLP [phone]  Claudine MoutonDeBlois, [name] [name] 01/08/2017, 1:53 PM

## 2017-01-08 NOTE — Clinical Social Work Note (Signed)
Clinical Social Work Assessment  Patient Details  Name: [name] [name] MRN: [id number] Date of Birth: 1950/11/29  Date of referral:  01/08/17               Reason for consult:  Facility Placement                Permission sought to share information with:  Facility Medical sales representativeContact Representative, Guardian Permission granted to share information::  No  Name::     [name] [name]  Agency::  [facility]  Relationship::  legal guardian  Contact Information:     Housing/Transportation Living arrangements for the past 2 months:  Group Home Source of Information:  Facility Patient Interpreter Needed:  None Criminal Activity/Legal Involvement Pertinent to Current Situation/Hospitalization:  No - Comment as needed Significant Relationships:  Other(Comment) (facility caregivers, guardian) Lives with:  Facility Resident Do you feel safe going back to the place where you live?  Yes Need for family participation in patient care:  No (Coment)  Care giving concerns:  Pt is LTC resident at group home- no concerns expressed for his care there.   Social Worker assessment / plan:  CSW spoke with pt facility concerned return to [facility] when stable- they are able to accept patient as long as medically stable.  Employment status:  Disabled (Comment on whether or not currently receiving Disability) Insurance information:  Medicare PT Recommendations:  Skilled Nursing Facility Information / Referral to community resources:  Skilled Nursing Facility  Patient/Family's Response to care:  Unsure at this time- awaiting response of legal guardian.  Patient/Family's Understanding of and Emotional Response to Diagnosis, Current Treatment, and Prognosis:  No questions or concerns at this time.  Emotional Assessment Appearance:  Appears stated age Attitude/Demeanor/Rapport:    Affect (typically observed):    Orientation:  Oriented to Self Alcohol / Substance use:  Not Applicable Psych involvement (Current and /or in the  community):     Discharge Needs  Concerns to be addressed:  Care Coordination Readmission within the last 30 days:  Yes Current discharge risk:  None Barriers to Discharge:  Continued Medical Work up   [name] [name], [name] Lupien H, LCSW 01/08/2017, 3:40 PM

## 2017-01-08 NOTE — Progress Notes (Addendum)
PROGRESS NOTE    [name] [name]  ZOX:[id number] DOB: [birth year] DOA: 01/06/2017 PCP: Lucretia Field   Brief Narrative:   66 y.o.malewith history of hypertension, GERD, hypothyroidism, gout, Down syndrome, nonverbal, anemia, CKD stage III, who presented with worsening mental status and cough.  Patient was hospitalized 05/3 > 5/8 due to sepsis 2/2UTI and proctitis. Pt had blood culture with no growth andurine culture positive forpansentitive Ecoli. Pt was discharged on Augmentin.   Assessment & Plan:   Principal Problem:   Sepsis (HCC) Active Problems:   DOWNS SYNDROME   Pure hypercholesterolemia   Anemia   Acute metabolic encephalopathy   GERD (gastroesophageal reflux disease)   Acute renal failure superimposed on stage 3 chronic kidney disease (HCC)   Aspiration pneumonia (HCC)   Sacral decubitus ulcer, stage III (HCC)  Acute metabolic encephalopathy likely secondary to underlying infection, aspiration pneumonia, improved -Cultures have been negative at this time -Continue IV antibiotics-vancomycin/Zosyn -Aspiration precautions -Seen by speech and swallow was cleared him for dysphagia 1 diet. Nurses assisting this morning to have patient eat while I was at bedside. Patient did well  Anemia chronic disease -Hemoglobin appears to be baseline around 9. It has dropped to 7.3 this morning but there are no signs of active bleeding -We will repeat hemoglobin and closely monitor. No need for transfusion at this time.   Stage III sacral ulcer -Wound care per nursing staff  Hypernatremia -Likely due to dehydration. Will provide him with gentle hydration and monitor urine output  Hypoalbuminemia/moderate protein calorie malnutrition -Encourage by mouth diet  Leukocytosis? -He's currently already on IV antibiotics. Continue at this time and monitor his WBC  Down Syndrome with Mod Dementia  -apparently state Rep is his guardian   Chronic Hypotension -cont to monitor at this  time, if continues to remain too low - can consider checking tsh and cortisol. Possibly start him on Midodrine or try thigh high stockings.   Addendum 2pm Due to persistent low BP and minimal improvement with IVF, I will order cortisol am. TSH is 3.8.  Also order Albumin 25% 25g q6hrs for 6 doses and Midodrine 2.5mg  po bid (can be uptritrated as needed)  DVT prophylaxis: Lovenox  Code Status: Full  Family Communication:  No family  Disposition Plan: SDU  Antimicrobials:   Vacn 5/20> 5/21  Cefepime 5/20 >5/21   Zosyn 5/21   Subjective: Patient is awake and alert, appears to be at baseline to me. Difficult to understand him. Denies any complaints. He is eating well this morning assistance.  Objective: Vitals:   01/08/17 0500 01/08/17 0530 01/08/17 0600 01/08/17 0832  BP: (!) 86/52 (!) 83/49 (!) 85/49   Pulse: 64 69 64   Resp: 18 13 13    Temp:    98.6 F (37 C)  TempSrc:    Oral  SpO2: 100% 100% 100%   Weight:      Height:        Intake/Output Summary (Last 24 hours) at 01/08/17 1036 Last data filed at 01/08/17 0910  Gross per 24 hour  Intake             3405 ml  Output              650 ml  Net             2755 ml   Filed Weights   01/07/17 0246  Weight: 49.2 kg (108 lb 7.5 oz)    Examination:  General exam: Appears calm and comfortable.  Difficult to understand. Cachectic, frail  Respiratory system: Clear to auscultation. Respiratory effort normal. Cardiovascular system: S1 & S2 heard, RRR. No JVD, murmurs, rubs, gallops or clicks. No pedal edema. Gastrointestinal system: Abdomen is nondistended, soft and nontender. No organomegaly or masses felt. Normal bowel sounds heard. Central nervous system: Alert and oriented. No focal neurological deficits. Extremities: Symmetric 5 x 5 power. Skin: No rashes, lesions or ulcers Psychiatry: Judgement and insight appear normal. Mood & affect appropriate.     Data Reviewed:   CBC:  Recent Labs Lab 01/06/17 0829  01/06/17 2308 01/08/17 0633  WBC 9.5 11.1* 16.5*  NEUTROABS 6.7 9.5*  --   HGB 9.5* 12.8* 7.3*  HCT 30.1* 41.0 23.3*  MCV 95.3 94.5 94.7  PLT 376 268 273   Basic Metabolic Panel:  Recent Labs Lab 01/06/17 0829 01/08/17 0300 01/08/17 0633  NA 147* 148* 151*  K 4.1 3.4* 3.6  CL 114* 120* 121*  CO2 25 21* 24  GLUCOSE 128* 117* 122*  BUN 27* 17 16  CREATININE 1.64* 1.23 1.26*  CALCIUM 8.9 7.6* 7.6*   GFR: Estimated Creatinine Clearance: 41.8 mL/min (A) (by C-G formula based on SCr of 1.26 mg/dL (H)). Liver Function Tests:  Recent Labs Lab 01/06/17 0829 01/08/17 0300 01/08/17 0633  AST 27 24 21   ALT 14* 13* 12*  ALKPHOS 65 74 70  BILITOT 0.6 0.9 0.9  PROT 6.0* 5.0* 4.8*  ALBUMIN 2.0* 1.6* 1.4*   No results for input(s): LIPASE, AMYLASE in the last 168 hours. No results for input(s): AMMONIA in the last 168 hours. Coagulation Profile:  Recent Labs Lab 01/07/17 0023  INR 1.45   Cardiac Enzymes: No results for input(s): CKTOTAL, CKMB, CKMBINDEX, TROPONINI in the last 168 hours. BNP (last 3 results) No results for input(s): PROBNP in the last 8760 hours. HbA1C: No results for input(s): HGBA1C in the last 72 hours. CBG:  Recent Labs Lab 01/06/17 2220  GLUCAP 157*   Lipid Profile: No results for input(s): CHOL, HDL, LDLCALC, TRIG, CHOLHDL, LDLDIRECT in the last 72 hours. Thyroid Function Tests:  Recent Labs  01/08/17 0633  TSH 3.824   Anemia Panel: No results for input(s): VITAMINB12, FOLATE, FERRITIN, TIBC, IRON, RETICCTPCT in the last 72 hours. Sepsis Labs:  Recent Labs Lab 01/06/17 1130 01/06/17 2318 01/07/17 0023 01/07/17 0301  PROCALCITON  --   --  0.49  --   LATICACIDVEN 1.46 2.16* 1.9 1.6    Recent Results (from the past 240 hour(s))  Blood Culture (routine x 2)     Status: None (Preliminary result)   Collection Time: 01/06/17  8:34 AM  Result Value Ref Range Status   Specimen Description BLOOD RIGHT ANTECUBITAL  Final   Special  Requests   Final    BOTTLES DRAWN AEROBIC AND ANAEROBIC Blood Culture adequate volume   Culture NO GROWTH < 24 HOURS  Final   Report Status PENDING  Incomplete  Blood Culture (routine x 2)     Status: None (Preliminary result)   Collection Time: 01/06/17  8:44 AM  Result Value Ref Range Status   Specimen Description BLOOD LEFT HAND  Final   Special Requests   Final    BOTTLES DRAWN AEROBIC ONLY Blood Culture adequate volume   Culture NO GROWTH < 24 HOURS  Final   Report Status PENDING  Incomplete  Urine culture     Status: None   Collection Time: 01/06/17 11:18 AM  Result Value Ref Range Status   Specimen Description URINE,  RANDOM  Final   Special Requests NONE  Final   Culture NO GROWTH  Final   Report Status 01/07/2017 FINAL  Final  Culture, blood (routine x 2)     Status: None (Preliminary result)   Collection Time: 01/06/17 11:00 PM  Result Value Ref Range Status   Specimen Description BLOOD RIGHT WRIST  Final   Special Requests   Final    BOTTLES DRAWN AEROBIC AND ANAEROBIC Blood Culture adequate volume   Culture PENDING  Incomplete   Report Status PENDING  Incomplete  Culture, blood (routine x 2)     Status: None (Preliminary result)   Collection Time: 01/06/17 11:08 PM  Result Value Ref Range Status   Specimen Description BLOOD RIGHT HAND  Final   Special Requests IN PEDIATRIC BOTTLE Blood Culture adequate volume  Final   Culture PENDING  Incomplete   Report Status PENDING  Incomplete  MRSA PCR Screening     Status: None   Collection Time: 01/07/17  2:45 AM  Result Value Ref Range Status   MRSA by PCR NEGATIVE NEGATIVE Final    Comment:        The GeneXpert MRSA Assay (FDA approved for NASAL specimens only), is one component of a comprehensive MRSA colonization surveillance program. It is not intended to diagnose MRSA infection nor to guide or monitor treatment for MRSA infections.          Radiology Studies: Ct Angio Chest Pe W/cm &/or Wo Cm  Result  Date: 01/06/2017 CLINICAL DATA:  Pt Hx of Down's, WC bound and minimal verbalization baseline. Today has SOB Also patients arms are contracted and cannot be straightened either above the head or to the side EXAM: CT ANGIOGRAPHY CHEST WITH CONTRAST TECHNIQUE: Multidetector CT imaging of the chest was performed using the standard protocol during bolus administration of intravenous contrast. Multiplanar CT image reconstructions and MIPs were obtained to evaluate the vascular anatomy. CONTRAST:  Isovue 370 80ml COMPARISON:  12/24/2016 FINDINGS: Cardiovascular: Satisfactory opacification of pulmonary arteries noted, and there is no evidence of pulmonary emboli. Incomplete opacification of the thoracic aorta with no suggestion of aneurysm, dissection, or stenosis. Mediastinum/Nodes: No enlarged mediastinal, hilar, or axillary lymph nodes. Thyroid gland, trachea, and esophagus demonstrate no significant findings. No pericardial effusion. Lungs/Pleura: No pleural effusion. No pneumothorax. Secretions in central right bronchial tree. Lungs are clear. Motion degrades images through the lung bases. Upper Abdomen: Cholecystectomy clips.  No acute findings. Musculoskeletal: Anterior vertebral endplate spurring at multiple levels in the mid thoracic spine. Advanced degenerative changes in bilateral shoulders. No fracture or worrisome bone lesion. Review of the MIP images confirms the above findings. IMPRESSION: 1. Negative for acute PE. 2. Secretions in central airways on the right. No focal pulmonary parenchymal process. Electronically Signed   By: Corlis Leak M.D.   On: 01/06/2017 14:01        Scheduled Meds: . allopurinol  100 mg Oral QHS  . aspirin  81 mg Oral Daily  . clindamycin  1 application Topical Daily  . memantine  28 mg Oral QHS   And  . donepezil  10 mg Oral QHS  . enoxaparin (LOVENOX) injection  30 mg Subcutaneous Q24H  . fluticasone  1 spray Each Nare Daily  . levothyroxine  25 mcg Oral QAC  breakfast  . Melatonin  3 mg Oral QHS  . mineral oil-hydrophilic petrolatum  1 application Topical BID  . pantoprazole  40 mg Oral Q1200  . pravastatin  20 mg Oral QHS  .  tamsulosin  0.4 mg Oral Daily   Continuous Infusions: . sodium chloride 100 mL/hr at 01/08/17 0413  . piperacillin-tazobactam (ZOSYN)  IV 3.375 g (01/08/17 0507)     LOS: 2 days    Time spent: 35 mins     Ankit Joline Maxcyhirag Amin, MD [facility] Pager [phone]   If 7PM-7AM, please contact night-coverage [email or web address] Password [email or web address] 01/08/2017, 10:36 AM

## 2017-01-08 NOTE — Care Management Note (Signed)
Case Management Note  Patient Details  Name: [name] [name] MRN: [id number] Date of Birth: 1951-05-20  Subjective/Objective:   From [place], presents with sepsis, ARF, Asp PNA, Sacras decubitus ulcer stage 3, met enceph,  has hx of down syndrome.  He has medicare/medicaid insurance.  PCP listed is Dr. Koren Shiver                  Action/Plan: NCM will follow along with CSW for dc needs.   Expected Discharge Date:  01/11/17               Expected Discharge Plan:  Group Home  In-House Referral:  Clinical Social Work  Discharge planning Services  CM Consult  Post Acute Care Choice:    Choice offered to:     DME Arranged:    DME Agency:     HH Arranged:    HH Agency:     Status of Service:  Completed, signed off  If discussed at [employer], dates discussed:    Additional Comments:  Zenon Mayo, RN 01/08/2017, 5:10 PM

## 2017-01-09 ENCOUNTER — Encounter (HOSPITAL_COMMUNITY): Payer: Self-pay | Admitting: Gastroenterology

## 2017-01-09 ENCOUNTER — Inpatient Hospital Stay (HOSPITAL_COMMUNITY): Payer: Medicare Other

## 2017-01-09 DIAGNOSIS — E440 Moderate protein-calorie malnutrition: Secondary | ICD-10-CM | POA: Insufficient documentation

## 2017-01-09 LAB — FERRITIN: Ferritin: 636 ng/mL — ABNORMAL HIGH (ref 24–336)

## 2017-01-09 LAB — CBC
HCT: 19.3 % — ABNORMAL LOW (ref 39.0–52.0)
Hemoglobin: 6 g/dL — CL (ref 13.0–17.0)
MCH: 29.3 pg (ref 26.0–34.0)
MCHC: 31.1 g/dL (ref 30.0–36.0)
MCV: 94.1 fL (ref 78.0–100.0)
PLATELETS: 229 10*3/uL (ref 150–400)
RBC: 2.05 MIL/uL — ABNORMAL LOW (ref 4.22–5.81)
RDW: 16.6 % — ABNORMAL HIGH (ref 11.5–15.5)
WBC: 8.6 10*3/uL (ref 4.0–10.5)

## 2017-01-09 LAB — BASIC METABOLIC PANEL
Anion gap: 7 (ref 5–15)
BUN: 15 mg/dL (ref 6–20)
CO2: 23 mmol/L (ref 22–32)
CREATININE: 1.15 mg/dL (ref 0.61–1.24)
Calcium: 7.6 mg/dL — ABNORMAL LOW (ref 8.9–10.3)
Chloride: 120 mmol/L — ABNORMAL HIGH (ref 101–111)
GFR calc Af Amer: >60 mL/min (ref >60)
GLUCOSE: 112 mg/dL — AB (ref 65–99)
Potassium: 3 mmol/L — ABNORMAL LOW (ref 3.5–5.1)
SODIUM: 150 mmol/L — AB (ref 135–145)

## 2017-01-09 LAB — VITAMIN B12: Vitamin B-12: 528 pg/mL (ref 180–914)

## 2017-01-09 LAB — RETICULOCYTES
RBC.: 3.5 MIL/uL — ABNORMAL LOW (ref 4.22–5.81)
RETIC COUNT ABSOLUTE: 24.5 10*3/uL (ref 19.0–186.0)
Retic Ct Pct: 0.7 % (ref 0.4–3.1)

## 2017-01-09 LAB — CORTISOL-AM, BLOOD: Cortisol - AM: 12.8 ug/dL (ref 6.7–22.6)

## 2017-01-09 LAB — ABO/RH: ABO/RH(D): A POS

## 2017-01-09 LAB — PREPARE RBC (CROSSMATCH)

## 2017-01-09 LAB — OCCULT BLOOD X 1 CARD TO LAB, STOOL: FECAL OCCULT BLD: NEGATIVE

## 2017-01-09 LAB — MAGNESIUM: Magnesium: 1.6 mg/dL — ABNORMAL LOW (ref 1.7–2.4)

## 2017-01-09 LAB — IRON AND TIBC
IRON: 32 ug/dL — AB (ref 45–182)
Saturation Ratios: 30 % (ref 17.9–39.5)
TIBC: 105 ug/dL — ABNORMAL LOW (ref 250–450)
UIBC: 73 ug/dL

## 2017-01-09 LAB — FOLATE: Folate: 10.5 ng/mL (ref >5.9)

## 2017-01-09 MED ORDER — SODIUM CHLORIDE 0.9 % IV SOLN: Freq: Once | INTRAVENOUS | Status: DC

## 2017-01-09 MED ORDER — CHLORHEXIDINE GLUCONATE 0.12 % MT SOLN
15.0000 mL | Freq: Two times a day (BID) | OROMUCOSAL | Status: DC
  Administered 2017-01-09 – 2017-01-16 (×14): 15 mL via OROMUCOSAL
  Filled 2017-01-09 (×9): qty 15

## 2017-01-09 MED ORDER — FUROSEMIDE 10 MG/ML IJ SOLN
40.0000 mg | Freq: Once | INTRAMUSCULAR | Status: AC
  Administered 2017-01-09: 40 mg via INTRAVENOUS
  Filled 2017-01-09: qty 4

## 2017-01-09 MED ORDER — DEXTROSE 5 % IV SOLN
INTRAVENOUS | Status: DC
  Administered 2017-01-09 – 2017-01-10 (×2): via INTRAVENOUS

## 2017-01-09 MED ORDER — ORAL CARE MOUTH RINSE
15.0000 mL | Freq: Two times a day (BID) | OROMUCOSAL | Status: DC
  Administered 2017-01-10 – 2017-01-16 (×13): 15 mL via OROMUCOSAL

## 2017-01-09 NOTE — Progress Notes (Signed)
01/09/2017 central monitor called patient HR was 44. RN went to assess patient heart rate 44 to 55. Dr Arthor CaptainElmahi was made aware through Amion at 1500. [facility] LLC[facility]adine Lashina Milles RN.

## 2017-01-09 NOTE — Progress Notes (Addendum)
PROGRESS NOTE    [name] [name]  BJY:[id number] DOB: 06/20/1951 DOA: 01/06/2017 PCP: Lucretia Field   Subjective: Awake and alert, mumbles, I think he was trying to answer when I asked him.  Brief Narrative:   65 y.o.malewith history of hypertension, GERD, hypothyroidism, gout, Down syndrome, nonverbal, anemia, CKD stage III, who presented with worsening mental status and cough.  Patient was hospitalized 05/3 > 5/8 due to sepsis 2/2UTI and proctitis. Pt had blood culture with no growth andurine culture positive forpansentitive Ecoli. Pt was discharged on Augmentin.   Assessment & Plan:   Principal Problem:   Sepsis (HCC) Active Problems:   DOWNS SYNDROME   Pure hypercholesterolemia   Anemia   Acute metabolic encephalopathy   GERD (gastroesophageal reflux disease)   Acute renal failure superimposed on stage 3 chronic kidney disease (HCC)   Aspiration pneumonia (HCC)   Sacral decubitus ulcer, stage III (HCC)   Malnutrition of moderate degree   Acute metabolic encephalopathy likely secondary to underlying infection, aspiration pneumonia, improved -Cultures have been negative at this time -Continue IV antibiotics-vancomycin/Zosyn -Aspiration precautions  Dysphagia -Per SLP on dysphagia 1 diet, he will need assistance.  Anemia, acute on chronic  -Hemoglobin was 12.8, but likely this was lab error, hemoglobin around 9.5 -Hemoglobin dropped further to 6.0 this morning. Check stools for occult blood. -CT scan done on 12/20/16 showed proctitis. GI to see  Stage III sacral ulcer -Wound care per nursing staff  Hypernatremia -Likely due to dehydration, change IV fluid to D5W.  Hypoalbuminemia/moderate protein calorie malnutrition -Encourage by mouth diet  Leukocytosis? -He's currently already on IV antibiotics. Continue at this time and monitor his WBC  Down Syndrome with Mod Dementia  -apparently state Rep is his guardian. -Continue to treat for now, if not improving  he will need roadmap for re-admission, likely to involve palliative.  Chronic Hypotension -Started on cortisol and compression stockings. Continue.   DVT prophylaxis: Lovenox  Code Status: Full  Family Communication:  No family  Disposition Plan: SDU  Antimicrobials:   Vacn 5/20> 5/21  Cefepime 5/20 >5/21   Zosyn 5/21    Objective: Vitals:   01/09/17 0730 01/09/17 0830 01/09/17 0856 01/09/17 0915  BP: 123/72 (!) 86/66  (!) 89/60  Pulse: 64 (!) 54 (!) 53 61  Resp: 15 18 17 18   Temp: 98.5 F (36.9 C) 98.6 F (37 C)  98.6 F (37 C)  TempSrc: Axillary Axillary  Axillary  SpO2: 100% 97% 100% 99%  Weight:      Height:        Intake/Output Summary (Last 24 hours) at 01/09/17 1032 Last data filed at 01/09/17 0930  Gross per 24 hour  Intake             3674 ml  Output             1250 ml  Net             2424 ml   Filed Weights   01/07/17 0246  Weight: 49.2 kg (108 lb 7.5 oz)    Examination:  General exam: Appears calm and comfortable. Difficult to understand. Cachectic, frail  Respiratory system: Clear to auscultation. Respiratory effort normal. Cardiovascular system: S1 & S2 heard, RRR. No JVD, murmurs, rubs, gallops or clicks. No pedal edema. Gastrointestinal system: Abdomen is nondistended, soft and nontender. No organomegaly or masses felt. Normal bowel sounds heard. Central nervous system: Alert and oriented. No focal neurological deficits. Extremities: Symmetric 5 x 5 power. Skin:  No rashes, lesions or ulcers Psychiatry: Judgement and insight appear normal. Mood & affect appropriate.     Data Reviewed:   CBC:  Recent Labs Lab 01/06/17 0829 01/06/17 2308 01/08/17 0633 01/09/17 0347  WBC 9.5 11.1* 16.5* 8.6  NEUTROABS 6.7 9.5*  --   --   HGB 9.5* 12.8* 7.3* 6.0*  HCT 30.1* 41.0 23.3* 19.3*  MCV 95.3 94.5 94.7 94.1  PLT 376 268 273 229   Basic Metabolic Panel:  Recent Labs Lab 01/06/17 0829 01/08/17 0300 01/08/17 0633 01/09/17 0347  NA  147* 148* 151* 150*  K 4.1 3.4* 3.6 3.0*  CL 114* 120* 121* 120*  CO2 25 21* 24 23  GLUCOSE 128* 117* 122* 112*  BUN 27* 17 16 15   CREATININE 1.64* 1.23 1.26* 1.15  CALCIUM 8.9 7.6* 7.6* 7.6*  MG  --   --   --  1.6*   GFR: Estimated Creatinine Clearance: 45.8 mL/min (by C-G formula based on SCr of 1.15 mg/dL). Liver Function Tests:  Recent Labs Lab 01/06/17 0829 01/08/17 0300 01/08/17 0633  AST 27 24 21   ALT 14* 13* 12*  ALKPHOS 65 74 70  BILITOT 0.6 0.9 0.9  PROT 6.0* 5.0* 4.8*  ALBUMIN 2.0* 1.6* 1.4*   No results for input(s): LIPASE, AMYLASE in the last 168 hours. No results for input(s): AMMONIA in the last 168 hours. Coagulation Profile:  Recent Labs Lab 01/07/17 0023  INR 1.45   Cardiac Enzymes: No results for input(s): CKTOTAL, CKMB, CKMBINDEX, TROPONINI in the last 168 hours. BNP (last 3 results) No results for input(s): PROBNP in the last 8760 hours. HbA1C: No results for input(s): HGBA1C in the last 72 hours. CBG:  Recent Labs Lab 01/06/17 2220  GLUCAP 157*   Lipid Profile: No results for input(s): CHOL, HDL, LDLCALC, TRIG, CHOLHDL, LDLDIRECT in the last 72 hours. Thyroid Function Tests:  Recent Labs  01/08/17 0633  TSH 3.824   Anemia Panel: No results for input(s): VITAMINB12, FOLATE, FERRITIN, TIBC, IRON, RETICCTPCT in the last 72 hours. Sepsis Labs:  Recent Labs Lab 01/06/17 1130 01/06/17 2318 01/07/17 0023 01/07/17 0301  PROCALCITON  --   --  0.49  --   LATICACIDVEN 1.46 2.16* 1.9 1.6    Recent Results (from the past 240 hour(s))  Blood Culture (routine x 2)     Status: None (Preliminary result)   Collection Time: 01/06/17  8:34 AM  Result Value Ref Range Status   Specimen Description BLOOD RIGHT ANTECUBITAL  Final   Special Requests   Final    BOTTLES DRAWN AEROBIC AND ANAEROBIC Blood Culture adequate volume   Culture NO GROWTH 2 DAYS  Final   Report Status PENDING  Incomplete  Blood Culture (routine x 2)     Status: None  (Preliminary result)   Collection Time: 01/06/17  8:44 AM  Result Value Ref Range Status   Specimen Description BLOOD LEFT HAND  Final   Special Requests   Final    BOTTLES DRAWN AEROBIC ONLY Blood Culture adequate volume   Culture NO GROWTH 2 DAYS  Final   Report Status PENDING  Incomplete  Urine culture     Status: None   Collection Time: 01/06/17 11:18 AM  Result Value Ref Range Status   Specimen Description URINE, RANDOM  Final   Special Requests NONE  Final   Culture NO GROWTH  Final   Report Status 01/07/2017 FINAL  Final  Culture, blood (routine x 2)     Status: None (  Preliminary result)   Collection Time: 01/06/17 11:00 PM  Result Value Ref Range Status   Specimen Description BLOOD RIGHT WRIST  Final   Special Requests   Final    BOTTLES DRAWN AEROBIC AND ANAEROBIC Blood Culture adequate volume   Culture NO GROWTH 1 DAY  Final   Report Status PENDING  Incomplete  Culture, blood (routine x 2)     Status: None (Preliminary result)   Collection Time: 01/06/17 11:08 PM  Result Value Ref Range Status   Specimen Description BLOOD RIGHT HAND  Final   Special Requests IN PEDIATRIC BOTTLE Blood Culture adequate volume  Final   Culture NO GROWTH 1 DAY  Final   Report Status PENDING  Incomplete  MRSA PCR Screening     Status: None   Collection Time: 01/07/17  2:45 AM  Result Value Ref Range Status   MRSA by PCR NEGATIVE NEGATIVE Final    Comment:        The GeneXpert MRSA Assay (FDA approved for NASAL specimens only), is one component of a comprehensive MRSA colonization surveillance program. It is not intended to diagnose MRSA infection nor to guide or monitor treatment for MRSA infections.          Radiology Studies: No results found.      Scheduled Meds: . allopurinol  100 mg Oral QHS  . aspirin  81 mg Oral Daily  . clindamycin  1 application Topical Daily  . memantine  28 mg Oral QHS   And  . donepezil  10 mg Oral QHS  . enoxaparin (LOVENOX) injection   30 mg Subcutaneous Q24H  . fluticasone  1 spray Each Nare Daily  . levothyroxine  25 mcg Oral QAC breakfast  . Melatonin  3 mg Oral QHS  . midodrine  2.5 mg Oral BID WC  . mineral oil-hydrophilic petrolatum  1 application Topical BID  . pantoprazole  40 mg Oral Q1200  . pravastatin  20 mg Oral QHS  . tamsulosin  0.4 mg Oral Daily   Continuous Infusions: . sodium chloride 100 mL/hr at 01/09/17 0023  . sodium chloride    . albumin human    . piperacillin-tazobactam (ZOSYN)  IV Stopped (01/09/17 0855)     LOS: 3 days    Time spent: 35 mins     Yossef Gilkison A, MD [facility] Pager [phone]  If 7PM-7AM, please contact night-coverage [email or web address] Password [facility] 01/09/2017, 10:32 AM

## 2017-01-09 NOTE — Progress Notes (Signed)
Legal guardian Earlie RavelingMichele Juchatz contacted via phone for consent to administer blood products. Telephone consent given by guardian to two RNs. Placed into patients chart.

## 2017-01-09 NOTE — Consult Note (Signed)
Reason for Consult: Anemia Referring Physician: Hospital team  [name] [name] is an 66 y.o. male.  HPI: Patient seen and examined and case discussed with the hospital team his nurse and his hospital computer chart reviewed and he has had anemia for some time which is slowly decreased his recurrent value without signs of obvious bleeding and has had multiple admissions this year and his albumin is very low and he is currently getting a transfusion and he is a ward of the state and he did eat a normal breakfast and a recent CT was reviewed and he has no specific complaints today and a previous colonoscopy almost 10 years ago was normal and no other obvious GI workup was seen and the patient is on an aspirin a day and periodically he has been on some nonsteroidals in the chart and is on H2 blockers but not pump inhibitors  Past Medical History:  Diagnosis Date  . Diabetes mellitus without complication ([place])   . Down syndrome     History reviewed. No pertinent surgical history.  History reviewed. No pertinent family history.  Social History:  reports that he has never smoked. He has never used smokeless tobacco. He reports that he does not drink alcohol or use drugs.  Allergies: No Known Allergies  Medications: I have reviewed the patient's current medications.  Results for orders placed or performed during the hospital encounter of 01/06/17 (from the past 48 hour(s))  Comprehensive metabolic panel     Status: Abnormal   Collection Time: 01/08/17  3:00 AM  Result Value Ref Range   Sodium 148 (H) 135 - 145 mmol/L    Comment: QUESTIONABLE RESULTS, RECOMMEND RECOLLECT TO VERIFY   Potassium 3.4 (L) 3.5 - 5.1 mmol/L    Comment: DELTA CHECK NOTED QUESTIONABLE RESULTS, RECOMMEND RECOLLECT TO VERIFY    Chloride 120 (H) 101 - 111 mmol/L    Comment: QUESTIONABLE RESULTS, RECOMMEND RECOLLECT TO VERIFY   CO2 21 (L) 22 - 32 mmol/L    Comment: QUESTIONABLE RESULTS, RECOMMEND RECOLLECT TO VERIFY    Glucose, Bld 117 (H) 65 - 99 mg/dL    Comment: QUESTIONABLE RESULTS, RECOMMEND RECOLLECT TO VERIFY   BUN 17 6 - 20 mg/dL    Comment: QUESTIONABLE RESULTS, RECOMMEND RECOLLECT TO VERIFY   Creatinine, Ser 1.23 0.61 - 1.24 mg/dL    Comment: QUESTIONABLE RESULTS, RECOMMEND RECOLLECT TO VERIFY   Calcium 7.6 (L) 8.9 - 10.3 mg/dL    Comment: QUESTIONABLE RESULTS, RECOMMEND RECOLLECT TO VERIFY   Total Protein 5.0 (L) 6.5 - 8.1 g/dL    Comment: QUESTIONABLE RESULTS, RECOMMEND RECOLLECT TO VERIFY   Albumin 1.6 (L) 3.5 - 5.0 g/dL    Comment: QUESTIONABLE RESULTS, RECOMMEND RECOLLECT TO VERIFY   AST 24 15 - 41 U/L    Comment: QUESTIONABLE RESULTS, RECOMMEND RECOLLECT TO VERIFY   ALT 13 (L) 17 - 63 U/L    Comment: QUESTIONABLE RESULTS, RECOMMEND RECOLLECT TO VERIFY   Alkaline Phosphatase 74 38 - 126 U/L    Comment: QUESTIONABLE RESULTS, RECOMMEND RECOLLECT TO VERIFY   Total Bilirubin 0.9 0.3 - 1.2 mg/dL    Comment: QUESTIONABLE RESULTS, RECOMMEND RECOLLECT TO VERIFY   GFR calc non Af Amer >60 >60 mL/min   GFR calc Af Amer >60 >60 mL/min    Comment: (NOTE) The eGFR has been calculated using the CKD EPI equation. This calculation has not been validated in all clinical situations. eGFR's persistently <60 mL/min signify possible Chronic Kidney Disease. CORRECTED ON 05/22 AT 0348:  PREVIOUSLY REPORTED AS >60    Anion gap 7 5 - 15  TSH     Status: None   Collection Time: 01/08/17  6:33 AM  Result Value Ref Range   TSH 3.824 0.350 - 4.500 uIU/mL    Comment: Performed by a 3rd Generation assay with a functional sensitivity of <=0.01 uIU/mL.  CBC     Status: Abnormal   Collection Time: 01/08/17  6:33 AM  Result Value Ref Range   WBC 16.5 (H) 4.0 - 10.5 K/uL    Comment: REPEATED TO VERIFY   RBC 2.46 (L) 4.22 - 5.81 MIL/uL   Hemoglobin 7.3 (L) 13.0 - 17.0 g/dL    Comment: SPECIMEN CHECKED FOR CLOTS REPEATED TO VERIFY    HCT 23.3 (L) 39.0 - 52.0 %   MCV 94.7 78.0 - 100.0 fL   MCH 29.7 26.0 -  34.0 pg   MCHC 31.3 30.0 - 36.0 g/dL   RDW 16.3 (H) 11.5 - 15.5 %   Platelets 273 150 - 400 K/uL    Comment: REPEATED TO VERIFY  Comprehensive metabolic panel     Status: Abnormal   Collection Time: 01/08/17  6:33 AM  Result Value Ref Range   Sodium 151 (H) 135 - 145 mmol/L   Potassium 3.6 3.5 - 5.1 mmol/L   Chloride 121 (H) 101 - 111 mmol/L   CO2 24 22 - 32 mmol/L   Glucose, Bld 122 (H) 65 - 99 mg/dL   BUN 16 6 - 20 mg/dL   Creatinine, Ser 1.26 (H) 0.61 - 1.24 mg/dL   Calcium 7.6 (L) 8.9 - 10.3 mg/dL   Total Protein 4.8 (L) 6.5 - 8.1 g/dL   Albumin 1.4 (L) 3.5 - 5.0 g/dL   AST 21 15 - 41 U/L   ALT 12 (L) 17 - 63 U/L   Alkaline Phosphatase 70 38 - 126 U/L   Total Bilirubin 0.9 0.3 - 1.2 mg/dL   GFR calc non Af Amer 59 (L) >60 mL/min   GFR calc Af Amer >60 >60 mL/min    Comment: (NOTE) The eGFR has been calculated using the CKD EPI equation. This calculation has not been validated in all clinical situations. eGFR's persistently <60 mL/min signify possible Chronic Kidney Disease.    Anion gap 6 5 - 15  CBC     Status: Abnormal   Collection Time: 01/09/17  3:47 AM  Result Value Ref Range   WBC 8.6 4.0 - 10.5 K/uL   RBC 2.05 (L) 4.22 - 5.81 MIL/uL   Hemoglobin 6.0 (LL) 13.0 - 17.0 g/dL    Comment: REPEATED TO VERIFY CRITICAL RESULT CALLED TO, READ BACK BY AND VERIFIED WITH: A.PETTIFORD,RN 3790 01/09/17 G.MCADOO    HCT 19.3 (L) 39.0 - 52.0 %   MCV 94.1 78.0 - 100.0 fL   MCH 29.3 26.0 - 34.0 pg   MCHC 31.1 30.0 - 36.0 g/dL   RDW 16.6 (H) 11.5 - 15.5 %   Platelets 229 150 - 400 K/uL  Basic metabolic panel     Status: Abnormal   Collection Time: 01/09/17  3:47 AM  Result Value Ref Range   Sodium 150 (H) 135 - 145 mmol/L   Potassium 3.0 (L) 3.5 - 5.1 mmol/L   Chloride 120 (H) 101 - 111 mmol/L   CO2 23 22 - 32 mmol/L   Glucose, Bld 112 (H) 65 - 99 mg/dL   BUN 15 6 - 20 mg/dL   Creatinine, Ser 1.15 0.61 - 1.24 mg/dL  Calcium 7.6 (L) 8.9 - 10.3 mg/dL   GFR calc non Af  Amer >60 >60 mL/min   GFR calc Af Amer >60 >60 mL/min    Comment: (NOTE) The eGFR has been calculated using the CKD EPI equation. This calculation has not been validated in all clinical situations. eGFR's persistently <60 mL/min signify possible Chronic Kidney Disease.    Anion gap 7 5 - 15  Magnesium     Status: Abnormal   Collection Time: 01/09/17  3:47 AM  Result Value Ref Range   Magnesium 1.6 (L) 1.7 - 2.4 mg/dL  Cortisol-am, blood     Status: None   Collection Time: 01/09/17  3:47 AM  Result Value Ref Range   Cortisol - AM 12.8 6.7 - 22.6 ug/dL  Type and screen [place]     Status: None (Preliminary result)   Collection Time: 01/09/17  5:43 AM  Result Value Ref Range   ABO/RH(D) A POS    Antibody Screen NEG    Sample Expiration 01/12/2017    Unit Number [id number]    Blood Component Type RED CELLS,LR    Unit division 00    Status of Unit ISSUED    Transfusion Status OK TO TRANSFUSE    Crossmatch Result Compatible    Unit Number [id number]    Blood Component Type RED CELLS,LR    Unit division 00    Status of Unit ALLOCATED    Transfusion Status OK TO TRANSFUSE    Crossmatch Result Compatible   Prepare RBC     Status: None   Collection Time: 01/09/17  5:43 AM  Result Value Ref Range   Order Confirmation ORDER PROCESSED BY BLOOD BANK   ABO/Rh     Status: None   Collection Time: 01/09/17  5:43 AM  Result Value Ref Range   ABO/RH(D) A POS   Prepare RBC     Status: None   Collection Time: 01/09/17 10:41 AM  Result Value Ref Range   Order Confirmation ORDER PROCESSED BY BLOOD BANK     Dg Swallowing Func-speech Pathology  Result Date: 01/09/2017 Objective Swallowing Evaluation: Type of Study: MBS-Modified Barium Swallow Study Patient Details Name: ARLANDER GILLEN MRN: [id number] Date of Birth: 1951-08-02 Today's Date: 01/09/2017 Time: SLP Start Time (ACUTE ONLY): 1023-SLP Stop Time (ACUTE ONLY): 1050 SLP Time Calculation (min) (ACUTE ONLY): 27 min  Past Medical History: Past Medical History: Diagnosis Date . Diabetes mellitus without complication ([place])  . Down syndrome  Past Surgical History: No past surgical history on file. HPI: CASMER YEPIZ a 66 y.o.malewith a primary medical h/o mental retardation/dementia from a group home, baseline wheelchair bound, total care, is [facility] ED due to more lethargic than normal. Diet at group home puree, thin liquids. ST to evalaute swallow function. Subjective: pt awake in bed Assessment / Plan / Recommendation CHL IP CLINICAL IMPRESSIONS 01/09/2017 Clinical Impression Pt demonstrates a primary oral dysphagia that impacts pharyngeal swallow. Oral function characterized by thrusting lingual movement with gradual transit of bolus with moderate oral residuals. Pt uanble to use straw and cup sips increased spillage and residuals; spoon feeding best method. Nectar and thin liquids spill to the pyriforms with eventual strong swallow trigger. 1-2 instances of aspiration occured with thin with sensation, but no expectoration of aspirate. Thins also spill to pharynx from oral cavity post swallow and threaten aspiration.  Nectar and purees tolerated very well. Recommend pt continue pureed diet and nectar thick liquids as he continues to  recover from pneumonia. Pt may be able to upgrade to thins under careful supervision from SLP in  home health venue. Will monitor tolerance in acute setting.  SLP Visit Diagnosis Dysphagia, oropharyngeal phase (R13.12) Attention and concentration deficit following -- Frontal lobe and executive function deficit following -- Impact on safety and function Moderate aspiration risk   CHL IP TREATMENT RECOMMENDATION 01/09/2017 Treatment Recommendations Therapy as outlined in treatment plan below   Prognosis 01/07/2017 Prognosis for Safe Diet Advancement Fair Barriers to Reach Goals -- Barriers/Prognosis Comment -- CHL IP DIET RECOMMENDATION 01/09/2017 SLP Diet Recommendations Nectar thick  liquid;Dysphagia 1 (Puree) solids Liquid Administration via -- Medication Administration Crushed with puree Compensations -- Postural Changes --   CHL IP OTHER RECOMMENDATIONS 01/09/2017 Recommended Consults -- Oral Care Recommendations Oral care BID Other Recommendations --   CHL IP FOLLOW UP RECOMMENDATIONS 01/09/2017 Follow up Recommendations Skilled Nursing facility;Home health SLP   CHL IP FREQUENCY AND DURATION 01/09/2017 Speech Therapy Frequency (ACUTE ONLY) min 2x/week Treatment Duration 2 weeks      CHL IP ORAL PHASE 01/09/2017 Oral Phase Impaired Oral - Pudding Teaspoon -- Oral - Pudding Cup -- Oral - Honey Teaspoon -- Oral - Honey Cup -- Oral - Nectar Teaspoon Lingual pumping;Reduced posterior propulsion;Lingual/palatal residue;Delayed oral transit;Decreased bolus cohesion;Premature spillage Oral - Nectar Cup -- Oral - Nectar Straw -- Oral - Thin Teaspoon Lingual pumping;Reduced posterior propulsion;Lingual/palatal residue;Delayed oral transit;Decreased bolus cohesion;Premature spillage Oral - Thin Cup Lingual pumping;Reduced posterior propulsion;Lingual/palatal residue;Delayed oral transit;Decreased bolus cohesion;Premature spillage Oral - Thin Straw Lingual pumping;Reduced posterior propulsion;Lingual/palatal residue;Delayed oral transit;Decreased bolus cohesion;Premature spillage Oral - Puree Lingual pumping;Reduced posterior propulsion;Lingual/palatal residue;Delayed oral transit;Decreased bolus cohesion;Premature spillage Oral - Mech Soft Other (Comment) Oral - Regular -- Oral - Multi-Consistency -- Oral - Pill -- Oral Phase - Comment --  No flowsheet data found. No flowsheet data found. No flowsheet data found. Herbie [place], [place] CCC-SLP [phone] [place], Katherene Ponto 01/09/2017, 11:46 AM               ROS negative except above Blood pressure (!) 89/60, pulse 61, temperature 98.6 F (37 C), temperature source Axillary, resp. rate 18, height 5' (1.524 m), weight 49.2 kg (108 lb 7.5 oz), SpO2 99  %. Physical Exam patient very lethargic and pale lying comfortably in the bed no acute distress answers yes and no questions lungs are clear heart regular rate and rhythm abdomen is soft nontender labs and CT reviewed  Assessment/Plan: Anemia in patient with multiple medical problems currently does not seem to be in a state where we should proceed with endoscopic studies Plan: Await guaiacs and iron studies and the patient would probably be best served with his power of attorney accompanying the patient to our office as an outpatient and decide in person with a recent proceed with endoscopic studies just to be sure that with nondiagnostic CT doubt anything big or bad playing a role and he probably has anemia of chronic disease but with a elevated INR and being on an aspirin guaiac positivity will not surprisingly and will check on tomorrow and please call if signs of obvious active bleeding and consider up grading his H2 blocker to a pump inhibitor and also consider discussing his case with his primary care physician to get an idea of how we should proceed and let us know if we could be of any further assistance with this hospital stay [facility] E 01/09/2017, 12:28 PM

## 2017-01-09 NOTE — Progress Notes (Signed)
CRITICAL VALUE ALERT  Critical Value:  hgb 6  Date & Time Notied:  01/09/2017 4:35 AM   Provider Notified: k schorr  Orders Received/Actions taken: [id number]

## 2017-01-09 NOTE — Progress Notes (Signed)
Modified Barium Swallow Progress Note  Patient Details  Name: [name] [name] MRN: [id number] Date of Birth: 12/02/1950  Today's Date: 01/09/2017  Modified Barium Swallow completed.  Full report located under Chart Review in the Imaging Section.  Brief recommendations include the following:  Clinical Impression  Pt demonstrates a primary oral dysphagia that impacts pharyngeal swallow. Oral function characterized by thrusting lingual movement with gradual transit of bolus with moderate oral residuals. Pt unable to use straw and cup sips increased spillage and residuals; spoon feeding best method. Nectar and thin liquids spill to the pyriforms with eventual swallow trigger with adequate strength. 1-2 instances of aspiration occured with thin with sensation, but no expectoration of aspirate. Thinl iquids spill form oral cavity post swallow as well and threaten aspiration. Nectar and purees tolerated very well. Recommend pt continue pureed diet and nectar thick liquids as he continues to recover from pneumonia. Pt may be able to upgrade to thins under careful supervision from SLP in  home health venue. Will monitor tolerance in acute setting.    Swallow Evaluation Recommendations       SLP Diet Recommendations: Nectar thick liquid;Dysphagia 1 (Puree) solids       Medication Administration: Crushed with puree   Supervision: Full assist for feeding           Oral Care Recommendations: Oral care BID       Harlon DittyBonnie Katya Rolston, MA CCC-SLP [phone]  Claudine MoutonDeBlois, Hira Trent [name] 01/09/2017,11:44 AM

## 2017-01-10 DIAGNOSIS — R69 Illness, unspecified: Secondary | ICD-10-CM

## 2017-01-10 DIAGNOSIS — E7800 Pure hypercholesterolemia, unspecified: Secondary | ICD-10-CM

## 2017-01-10 DIAGNOSIS — Z7189 Other specified counseling: Secondary | ICD-10-CM

## 2017-01-10 DIAGNOSIS — Z515 Encounter for palliative care: Secondary | ICD-10-CM

## 2017-01-10 LAB — TYPE AND SCREEN
ABO/RH(D): A POS
Antibody Screen: NEGATIVE
Unit division: 0
Unit division: 0

## 2017-01-10 LAB — BPAM RBC
Blood Product Expiration Date: 201806042359
Blood Product Expiration Date: 201806042359
ISSUE DATE / TIME: 201805230843
ISSUE DATE / TIME: 201805231338
Unit Type and Rh: 6200
Unit Type and Rh: 6200

## 2017-01-10 LAB — BASIC METABOLIC PANEL
Anion gap: 8 (ref 5–15)
BUN: 13 mg/dL (ref 6–20)
CALCIUM: 8 mg/dL — AB (ref 8.9–10.3)
CO2: 26 mmol/L (ref 22–32)
Chloride: 113 mmol/L — ABNORMAL HIGH (ref 101–111)
Creatinine, Ser: 1.2 mg/dL (ref 0.61–1.24)
GFR calc Af Amer: >60 mL/min (ref >60)
GLUCOSE: 197 mg/dL — AB (ref 65–99)
POTASSIUM: 3.2 mmol/L — AB (ref 3.5–5.1)
Sodium: 147 mmol/L — ABNORMAL HIGH (ref 135–145)

## 2017-01-10 LAB — CBC
HCT: 29.2 % — ABNORMAL LOW (ref 39.0–52.0)
HEMOGLOBIN: 9.3 g/dL — AB (ref 13.0–17.0)
MCH: 28.7 pg (ref 26.0–34.0)
MCHC: 31.8 g/dL (ref 30.0–36.0)
MCV: 90.1 fL (ref 78.0–100.0)
PLATELETS: 243 10*3/uL (ref 150–400)
RBC: 3.24 MIL/uL — ABNORMAL LOW (ref 4.22–5.81)
RDW: 16 % — ABNORMAL HIGH (ref 11.5–15.5)
WBC: 9.6 10*3/uL (ref 4.0–10.5)

## 2017-01-10 LAB — MAGNESIUM: Magnesium: 1.6 mg/dL — ABNORMAL LOW (ref 1.7–2.4)

## 2017-01-10 MED ORDER — POTASSIUM CHLORIDE CRYS ER 20 MEQ PO TBCR
40.0000 meq | EXTENDED_RELEASE_TABLET | Freq: Once | ORAL | Status: AC
  Administered 2017-01-10: 40 meq via ORAL
  Filled 2017-01-10: qty 2

## 2017-01-10 MED ORDER — GUAIFENESIN 100 MG/5ML PO SOLN
5.0000 mL | ORAL | Status: DC | PRN
  Administered 2017-01-10 – 2017-01-11 (×2): 100 mg via ORAL
  Filled 2017-01-10 (×2): qty 5

## 2017-01-10 MED ORDER — SODIUM CHLORIDE 0.9 % IV SOLN
INTRAVENOUS | Status: DC
  Administered 2017-01-10: 15:00:00 via INTRAVENOUS
  Filled 2017-01-10 (×2): qty 1000

## 2017-01-10 MED ORDER — MORPHINE SULFATE (CONCENTRATE) 10 MG/0.5ML PO SOLN: 5.0000 mg | ORAL | Status: DC | PRN

## 2017-01-10 MED ORDER — MAGNESIUM SULFATE 2 GM/50ML IV SOLN
2.0000 g | Freq: Once | INTRAVENOUS | Status: AC
  Administered 2017-01-10: 2 g via INTRAVENOUS
  Filled 2017-01-10: qty 50

## 2017-01-10 NOTE — Care Management Important Message (Signed)
Important Message  Patient Details  Name: [name] LiasJohn W [name] MRN: [id number] Date of Birth: 1951-02-27   Medicare Important Message Given:  Yes    Kyla BalzarineShealy, Gabe Glace Abena 01/10/2017, 10:28 AM

## 2017-01-10 NOTE — Progress Notes (Signed)
PROGRESS NOTE    [name] [name]  ZOX:[id number] DOB: Aug 25, 1950 DOA: 01/06/2017 PCP: Lucretia Field   Subjective: Awake and alert, no acute distress.  Brief Narrative:   66 y.o.malewith history of hypertension, GERD, hypothyroidism, gout, Down syndrome, nonverbal, anemia, CKD stage III, who presented with worsening mental status and cough.  Patient was hospitalized 05/3 > 5/8 due to sepsis 2/2UTI and proctitis. Pt had blood culture with no growth andurine culture positive forpansentitive Ecoli. Pt was discharged on Augmentin.   Assessment & Plan:   Principal Problem:   Sepsis (HCC) Active Problems:   DOWNS SYNDROME   Pure hypercholesterolemia   Anemia   Acute metabolic encephalopathy   GERD (gastroesophageal reflux disease)   Acute renal failure superimposed on stage 3 chronic kidney disease (HCC)   Aspiration pneumonia (HCC)   Sacral decubitus ulcer, stage III (HCC)   Malnutrition of moderate degree   Acute metabolic encephalopathy likely secondary to underlying infection, aspiration pneumonia, improved -Cultures have been negative at this time -Continue IV antibiotics-vancomycin/Zosyn -Aspiration precautions.  Failure to thrive -Patient albumin is 1.4, now he is bleeding and developed dysphagia. -Not sure if he will be able to consume the expected nutrients. -Dysphagia, dementia and Down syndrome, will ask palliative to evaluate.  Dysphagia -Per SLP on dysphagia 1 diet with nectar thick liquids, he will need assistance.  Anemia, acute on chronic  -Hemoglobin was 12.8, but likely this was lab error, hemoglobin around 9.5 -Hemoglobin dropped further to 6.0 this morning. Check stools for occult blood. -CT scan done on 12/20/16 showed proctitis. GI to see  Stage III sacral ulcer -Wound care per nursing staff, pressure injury -Per WC nurse documentation: Dark purple deep tissue injuries, Sacrum 4X4cm; Left buttock 4X2cm   Hypernatremia -Likely due to dehydration,  change IV fluid to D5W.  Hypoalbuminemia/moderate protein calorie malnutrition -Encourage by mouth diet  Leukocytosis? -He's currently already on IV antibiotics. Continue at this time and monitor his WBC  Down Syndrome with Mod Dementia  -apparently state Rep is his guardian. -Continue to treat for now, if not improving he will need roadmap for re-admission, likely to involve palliative.  Chronic Hypotension -Started on cortisol and compression stockings. Continue.   DVT prophylaxis: Lovenox  Code Status: Full  Family Communication:  No family  Disposition Plan: SDU  Antimicrobials:   Vacn 5/20> 5/21  Cefepime 5/20 >5/21   Zosyn 5/21    Objective: Vitals:   01/10/17 0830 01/10/17 1000 01/10/17 1100 01/10/17 1237  BP: (!) 91/43 (!) 90/59 (!) 103/57 95/80  Pulse: (!) 59 (!) 46 (!) 50 (!) 50  Resp: 20 17 (!) 21 17  Temp:    99 F (37.2 C)  TempSrc:    Axillary  SpO2: 91% 91% 99% 96%  Weight:      Height:        Intake/Output Summary (Last 24 hours) at 01/10/17 1258 Last data filed at 01/10/17 1100  Gross per 24 hour  Intake             1345 ml  Output             2300 ml  Net             -955 ml   Filed Weights   01/07/17 0246  Weight: 49.2 kg (108 lb 7.5 oz)    Examination:  General exam: Appears calm and comfortable. Difficult to understand. Cachectic, frail  Respiratory system: Clear to auscultation. Respiratory effort normal. Cardiovascular system: S1 &  S2 heard, RRR. No JVD, murmurs, rubs, gallops or clicks. No pedal edema. Gastrointestinal system: Abdomen is nondistended, soft and nontender. No organomegaly or masses felt. Normal bowel sounds heard. Central nervous system: Alert and oriented. No focal neurological deficits. Extremities: Symmetric 5 x 5 power. Skin: No rashes, lesions or ulcers Psychiatry: Judgement and insight appear normal. Mood & affect appropriate.     Data Reviewed:   CBC:  Recent Labs Lab 01/06/17 0829 01/06/17 2308  01/08/17 [id number] 01/09/17 0347 01/10/17 0229  WBC 9.5 11.1* 16.5* 8.6 9.6  NEUTROABS 6.7 9.5*  --   --   --   HGB 9.5* 12.8* 7.3* 6.0* 9.3*  HCT 30.1* 41.0 23.3* 19.3* 29.2*  MCV 95.3 94.5 94.7 94.1 90.1  PLT 376 268 273 229 243   Basic Metabolic Panel:  Recent Labs Lab 01/06/17 0829 01/08/17 0300 01/08/17 0633 01/09/17 0347 01/10/17 0229  NA 147* 148* 151* 150* 147*  K 4.1 3.4* 3.6 3.0* 3.2*  CL 114* 120* 121* 120* 113*  CO2 25 21* 24 23 26   GLUCOSE 128* 117* 122* 112* 197*  BUN 27* 17 16 15 13   CREATININE 1.64* 1.23 1.26* 1.15 1.20  CALCIUM 8.9 7.6* 7.6* 7.6* 8.0*  MG  --   --   --  1.6* 1.6*   GFR: Estimated Creatinine Clearance: 43.8 mL/min (by C-G formula based on SCr of 1.2 mg/dL). Liver Function Tests:  Recent Labs Lab 01/06/17 0829 01/08/17 0300 01/08/17 0633  AST 27 24 21   ALT 14* 13* 12*  ALKPHOS 65 74 70  BILITOT 0.6 0.9 0.9  PROT 6.0* 5.0* 4.8*  ALBUMIN 2.0* 1.6* 1.4*   No results for input(s): LIPASE, AMYLASE in the last 168 hours. No results for input(s): AMMONIA in the last 168 hours. Coagulation Profile:  Recent Labs Lab 01/07/17 0023  INR 1.45   Cardiac Enzymes: No results for input(s): CKTOTAL, CKMB, CKMBINDEX, TROPONINI in the last 168 hours. BNP (last 3 results) No results for input(s): PROBNP in the last 8760 hours. HbA1C: No results for input(s): HGBA1C in the last 72 hours. CBG:  Recent Labs Lab 01/06/17 2220  GLUCAP 157*   Lipid Profile: No results for input(s): CHOL, HDL, LDLCALC, TRIG, CHOLHDL, LDLDIRECT in the last 72 hours. Thyroid Function Tests:  Recent Labs  01/08/17 0633  TSH 3.824   Anemia Panel:  Recent Labs  01/09/17 1845  VITAMINB12 528  FOLATE 10.5  FERRITIN 636*  TIBC 105*  IRON 32*  RETICCTPCT 0.7   Sepsis Labs:  Recent Labs Lab 01/06/17 1130 01/06/17 2318 01/07/17 0023 01/07/17 0301  PROCALCITON  --   --  0.49  --   LATICACIDVEN 1.46 2.16* 1.9 1.6    Recent Results (from the past  240 hour(s))  Blood Culture (routine x 2)     Status: None (Preliminary result)   Collection Time: 01/06/17  8:34 AM  Result Value Ref Range Status   Specimen Description BLOOD RIGHT ANTECUBITAL  Final   Special Requests   Final    BOTTLES DRAWN AEROBIC AND ANAEROBIC Blood Culture adequate volume   Culture NO GROWTH 3 DAYS  Final   Report Status PENDING  Incomplete  Blood Culture (routine x 2)     Status: None (Preliminary result)   Collection Time: 01/06/17  8:44 AM  Result Value Ref Range Status   Specimen Description BLOOD LEFT HAND  Final   Special Requests   Final    BOTTLES DRAWN AEROBIC ONLY Blood Culture adequate volume  Culture NO GROWTH 3 DAYS  Final   Report Status PENDING  Incomplete  Urine culture     Status: None   Collection Time: 01/06/17 11:18 AM  Result Value Ref Range Status   Specimen Description URINE, RANDOM  Final   Special Requests NONE  Final   Culture NO GROWTH  Final   Report Status 01/07/2017 FINAL  Final  Culture, blood (routine x 2)     Status: None (Preliminary result)   Collection Time: 01/06/17 11:00 PM  Result Value Ref Range Status   Specimen Description BLOOD RIGHT WRIST  Final   Special Requests   Final    BOTTLES DRAWN AEROBIC AND ANAEROBIC Blood Culture adequate volume   Culture NO GROWTH 2 DAYS  Final   Report Status PENDING  Incomplete  Culture, blood (routine x 2)     Status: None (Preliminary result)   Collection Time: 01/06/17 11:08 PM  Result Value Ref Range Status   Specimen Description BLOOD RIGHT HAND  Final   Special Requests IN PEDIATRIC BOTTLE Blood Culture adequate volume  Final   Culture NO GROWTH 2 DAYS  Final   Report Status PENDING  Incomplete  MRSA PCR Screening     Status: None   Collection Time: 01/07/17  2:45 AM  Result Value Ref Range Status   MRSA by PCR NEGATIVE NEGATIVE Final    Comment:        The GeneXpert MRSA Assay (FDA approved for NASAL specimens only), is one component of a comprehensive MRSA  colonization surveillance program. It is not intended to diagnose MRSA infection nor to guide or monitor treatment for MRSA infections.          Radiology Studies: Dg Swallowing Func-speech Pathology  Result Date: 01/09/2017 Objective Swallowing Evaluation: Type of Study: MBS-Modified Barium Swallow Study Patient Details Name: EMETT STAPEL MRN: [id number] Date of Birth: February 04, 1951 Today's Date: 01/09/2017 Time: SLP Start Time (ACUTE ONLY): 1023-SLP Stop Time (ACUTE ONLY): 1050 SLP Time Calculation (min) (ACUTE ONLY): 27 min Past Medical History: Past Medical History: Diagnosis Date . Diabetes mellitus without complication (HCC)  . Down syndrome  Past Surgical History: No past surgical history on file. HPI: BRANCE DARTT a 66 y.o.malewith a primary medical h/o mental retardation/dementia from a group home, baseline wheelchair bound, total care, is brought to [facility] ED due to more lethargic than normal. Diet at group home puree, thin liquids. ST to evalaute swallow function. Subjective: pt awake in bed Assessment / Plan / Recommendation CHL IP CLINICAL IMPRESSIONS 01/09/2017 Clinical Impression Pt demonstrates a primary oral dysphagia that impacts pharyngeal swallow. Oral function characterized by thrusting lingual movement with gradual transit of bolus with moderate oral residuals. Pt uanble to use straw and cup sips increased spillage and residuals; spoon feeding best method. Nectar and thin liquids spill to the pyriforms with eventual strong swallow trigger. 1-2 instances of aspiration occured with thin with sensation, but no expectoration of aspirate. Thins also spill to pharynx from oral cavity post swallow and threaten aspiration.  Nectar and purees tolerated very well. Recommend pt continue pureed diet and nectar thick liquids as he continues to recover from pneumonia. Pt may be able to upgrade to thins under careful supervision from SLP in  home health venue. Will monitor tolerance in acute setting.   SLP Visit Diagnosis Dysphagia, oropharyngeal phase (R13.12) Attention and concentration deficit following -- Frontal lobe and executive function deficit following -- Impact on safety and function Moderate aspiration risk   CHL  IP TREATMENT RECOMMENDATION 01/09/2017 Treatment Recommendations Therapy as outlined in treatment plan below   Prognosis 01/07/2017 Prognosis for Safe Diet Advancement Fair Barriers to Reach Goals -- Barriers/Prognosis Comment -- CHL IP DIET RECOMMENDATION 01/09/2017 SLP Diet Recommendations Nectar thick liquid;Dysphagia 1 (Puree) solids Liquid Administration via -- Medication Administration Crushed with puree Compensations -- Postural Changes --   CHL IP OTHER RECOMMENDATIONS 01/09/2017 Recommended Consults -- Oral Care Recommendations Oral care BID Other Recommendations --   CHL IP FOLLOW UP RECOMMENDATIONS 01/09/2017 Follow up Recommendations Skilled Nursing facility;Home health SLP   CHL IP FREQUENCY AND DURATION 01/09/2017 Speech Therapy Frequency (ACUTE ONLY) min 2x/week Treatment Duration 2 weeks      CHL IP ORAL PHASE 01/09/2017 Oral Phase Impaired Oral - Pudding Teaspoon -- Oral - Pudding Cup -- Oral - Honey Teaspoon -- Oral - Honey Cup -- Oral - Nectar Teaspoon Lingual pumping;Reduced posterior propulsion;Lingual/palatal residue;Delayed oral transit;Decreased bolus cohesion;Premature spillage Oral - Nectar Cup -- Oral - Nectar Straw -- Oral - Thin Teaspoon Lingual pumping;Reduced posterior propulsion;Lingual/palatal residue;Delayed oral transit;Decreased bolus cohesion;Premature spillage Oral - Thin Cup Lingual pumping;Reduced posterior propulsion;Lingual/palatal residue;Delayed oral transit;Decreased bolus cohesion;Premature spillage Oral - Thin Straw Lingual pumping;Reduced posterior propulsion;Lingual/palatal residue;Delayed oral transit;Decreased bolus cohesion;Premature spillage Oral - Puree Lingual pumping;Reduced posterior propulsion;Lingual/palatal residue;Delayed oral  transit;Decreased bolus cohesion;Premature spillage Oral - Mech Soft Other (Comment) Oral - Regular -- Oral - Multi-Consistency -- Oral - Pill -- Oral Phase - Comment --  No flowsheet data found. No flowsheet data found. No flowsheet data found. Harlon Ditty, [place] CCC-SLP [phone] Claudine Mouton 01/09/2017, 11:46 AM                   Scheduled Meds: . allopurinol  100 mg Oral QHS  . aspirin  81 mg Oral Daily  . chlorhexidine  15 mL Mouth Rinse BID  . clindamycin  1 application Topical Daily  . memantine  28 mg Oral QHS   And  . donepezil  10 mg Oral QHS  . enoxaparin (LOVENOX) injection  30 mg Subcutaneous Q24H  . fluticasone  1 spray Each Nare Daily  . levothyroxine  25 mcg Oral QAC breakfast  . mouth rinse  15 mL Mouth Rinse q12n4p  . Melatonin  3 mg Oral QHS  . midodrine  2.5 mg Oral BID WC  . mineral oil-hydrophilic petrolatum  1 application Topical BID  . pantoprazole  40 mg Oral Q1200  . pravastatin  20 mg Oral QHS  . tamsulosin  0.4 mg Oral Daily   Continuous Infusions: . dextrose 75 mL/hr at 01/10/17 0437  . piperacillin-tazobactam (ZOSYN)  IV Stopped (01/10/17 0930)     LOS: 4 days    Time spent: 35 mins     Brad Lieurance A, MD [facility] Pager [phone]  If 7PM-7AM, please contact night-coverage [email or web address] Password [facility] 01/10/2017, 12:58 PM

## 2017-01-10 NOTE — Progress Notes (Signed)
[name] [name] 12:52 PM  Subjective: Patient doing about the same without much improvement after transfusion and his case discussed with his nurse and no signs of bleeding but only ate minimal breakfast  Objective: Vital signs stable afebrile no acute distress lying comfortably in the bed abdomen is soft nontender hemoglobin increased not iron deficient guaiac-negative  Assessment: Anemia probably of chronic disease CT nonrevealing  Plan: Please see yesterday's notes for my thoughts and please call us this weekend if we could be of any further assistance with this hospital stay  [facility] HospitalMAGOD,[name] [name] E  Pager [phone] After 5PM or if no answer call [phone]

## 2017-01-10 NOTE — Consult Note (Signed)
Consultation Note Date: 01/10/2017   Patient Name: [name] [name]  DOB: 1951-05-01  MRN: [id number]  Age / Sex: 66 y.o., male  PCP: [name], [name] Began Referring Physician: Clydia Llano, MD  Reason for Consultation: Establishing goals of care  HPI/Patient Profile: 66 y.o. male  with past medical history of Down Syndrome (nonverbal), HTN, GERD, hypothyroidism, anemia of chronic disease, and CKD stage 3. He presented to the ED with worsening mental status, fever, and a cough. He was admitted on 01/06/2017. He was found to have aspiration pneumonia. Speech evaluated and MBS showed primary oral dysphagia with moderate aspiration risk. In chart review he has clear evidence of failure to thrive with Albumin 1.4 and significant weight loss (150lb 2 years ago, 130lb 10/2016, and now 108lb this admission). In light of his failure to thrive, dysphagia, and risk for recurrent aspiration, Palliative was consulted to assist in clarifying goals of care and codes status. Of note, he has a state appointed legal guardian: [name] [name] [phone]).  Clinical Assessment and Goals of Care: [name] [name] is nonverbal and unable to meaningfully participate in a goals of care conversation. I called his legal guardian, [name] [name], and spoke with her over the phone. I reviewed his medical issues being addressed here, and detailed the swallowing study results. I also shared my concerns about his failure to thrive with obvious physical deterioration.   In talking though these issues, [name] [name] was able to verbalize that care for [name] [name] should be focused around maintaining quality of life through comfort and dignity. In aligning with that goal, I recommended considering DNR status and transition to hospice at discharge. I specifically explained that Hospice would enable ongoing symptom support at his group home, and I believe that he remains at high risk for recurrent infections and acute  deterioration given his nutritional status and dysphagia. Recurrent hospitalizations and aggressive care approaches would likely prolong his life and suffering, and would not enable comfort and dignity. Finally, I also strongly recommended against a feeding tube. I explained the risks and burdens of a feeding tube, and shared those would greatly outweigh the benefit it would provide.   [name] [name] was receptive to this information. She will need documentation of the DNR recommendation, and will fax the required paperwork to my office. She will also take the recommendations to the patient's brother for input, and to her supervisor. We will plan to speak again tomorrow.  Primary Decision Maker LEGAL GUARDIAN   SUMMARY OF RECOMMENDATIONS    Full code and full scope care  I have recommended DNR and transition to Hospice after discharge; legal guardian to review recommendation with pt's brother and her supervisor. Waiting on DNR paperwork to be faxed to me; will require Palliative MD and primary team MD to fill out  Code Status/Advance Care Planning:  Full code  Symptom Management:   Pt does not appear to be in any discomfort. Per his care nurse, he does cry out when moved or repositioned. I will start Roxanol 5mg  SL q3H PRN.   Palliative Prophylaxis:   Aspiration, Bowel Regimen, Frequent Pain Assessment, Oral Care, Palliative Wound Care and Turn Reposition  Additional Recommendations (Limitations, Scope, Preferences):  Full Scope Treatment  Psycho-social/Spiritual:   Desire for further Chaplaincy support:no  Additional Recommendations: Education on Hospice  Prognosis:   < 6 months  Discharge Planning: To Be Determined      Primary Diagnoses: Present on Admission: . Pure hypercholesterolemia . Sepsis (HCC) . GERD (gastroesophageal reflux disease) . Anemia .  Acute renal failure superimposed on stage 3 chronic kidney disease (HCC) . Aspiration pneumonia (HCC) . Acute metabolic  encephalopathy . Sacral decubitus ulcer, stage III (HCC)   I have reviewed the medical record, interviewed the patient and family, and examined the patient. The following aspects are pertinent.  Past Medical History:  Diagnosis Date  . Diabetes mellitus without complication (HCC)   . Down syndrome    Social History   Social History  . Marital status: Single    Spouse name: N/A  . Number of children: N/A  . Years of education: N/A   Social History Main Topics  . Smoking status: Never Smoker  . Smokeless tobacco: Never Used  . Alcohol use No  . Drug use: No  . Sexual activity: No   Other Topics Concern  . None   Social History Narrative  . None   History reviewed. No pertinent family history. Scheduled Meds: . allopurinol  100 mg Oral QHS  . aspirin  81 mg Oral Daily  . chlorhexidine  15 mL Mouth Rinse BID  . clindamycin  1 application Topical Daily  . memantine  28 mg Oral QHS   And  . donepezil  10 mg Oral QHS  . enoxaparin (LOVENOX) injection  30 mg Subcutaneous Q24H  . fluticasone  1 spray Each Nare Daily  . levothyroxine  25 mcg Oral QAC breakfast  . mouth rinse  15 mL Mouth Rinse q12n4p  . Melatonin  3 mg Oral QHS  . midodrine  2.5 mg Oral BID WC  . mineral oil-hydrophilic petrolatum  1 application Topical BID  . pantoprazole  40 mg Oral Q1200  . potassium chloride  40 mEq Oral Once  . pravastatin  20 mg Oral QHS  . tamsulosin  0.4 mg Oral Daily   Continuous Infusions: . magnesium sulfate 1 - 4 g bolus IVPB    . piperacillin-tazobactam (ZOSYN)  IV Stopped (01/10/17 0930)  . sodium chloride 0.9 % 1,000 mL with potassium chloride 20 mEq infusion     PRN Meds:.acetaminophen, albuterol, guaiFENesin, hydrocortisone cream, lactulose, RESOURCE THICKENUP CLEAR No Known Allergies   Review of Systems  -Unable to obtain, pt non-verbal.   Physical Exam  Constitutional: He appears cachectic. He has a sickly appearance.  Frail man appears older than age  HENT:   Head: Normocephalic and atraumatic.  Mouth/Throat: Oropharynx is clear and moist. Abnormal dentition. No oropharyngeal exudate.  Eyes: EOM are normal.  Neck: Normal range of motion. Neck supple.  Cardiovascular: Normal rate and regular rhythm.   Pulmonary/Chest: No accessory muscle usage. Tachypnea noted. He has rhonchi.  Abdominal: Soft. Normal appearance and bowel sounds are normal.  Musculoskeletal: He exhibits edema (BUE).  Contracted and cries out when moved  Neurological: He is alert.  Non verbal. Does track me in the room.   Skin: Skin is warm and dry. There is pallor.  Psychiatric:  Calm in bed.     Vital Signs: BP 95/80   Pulse (!) 50   Temp 99 F (37.2 C) (Axillary)   Resp 17   Ht 5' (1.524 m)   Wt 49.2 kg (108 lb 7.5 oz)   SpO2 96%   BMI 21.18 kg/m  Pain Assessment: PAINAD POSS *See Group Information*: 2-Acceptable,Slightly drowsy, easily aroused Pain Score: 0-No pain   SpO2: SpO2: 96 % O2 Device:SpO2: 96 % O2 Flow Rate: .O2 Flow Rate (L/min): 2 L/min  IO: Intake/output summary:  Intake/Output Summary (Last 24 hours) at 01/10/17 1434 Last data  filed at 01/10/17 1100  Gross per 24 hour  Intake             1220 ml  Output             2300 ml  Net            -1080 ml    LBM: Last BM Date: 01/09/17 Baseline Weight: Weight: 49.2 kg (108 lb 7.5 oz) Most recent weight: Weight: 49.2 kg (108 lb 7.5 oz)     Palliative Assessment/Data: PPS 30%    Time Total: 50 minutes Greater than 50%  of this time was spent counseling and coordinating care related to the above assessment and plan.  Signed by: Murrell Converse, NP Palliative Medicine Team Pager # [phone] (M-F 7a-5p) Team Phone # [phone] (Nights/Weekends)

## 2017-01-11 DIAGNOSIS — R532 Functional quadriplegia: Secondary | ICD-10-CM

## 2017-01-11 LAB — CBC
HEMATOCRIT: 31.6 % — AB (ref 39.0–52.0)
Hemoglobin: 10.2 g/dL — ABNORMAL LOW (ref 13.0–17.0)
MCH: 29.1 pg (ref 26.0–34.0)
MCHC: 32.3 g/dL (ref 30.0–36.0)
MCV: 90.3 fL (ref 78.0–100.0)
Platelets: 262 10*3/uL (ref 150–400)
RBC: 3.5 MIL/uL — ABNORMAL LOW (ref 4.22–5.81)
RDW: 15.6 % — AB (ref 11.5–15.5)
WBC: 9.7 10*3/uL (ref 4.0–10.5)

## 2017-01-11 LAB — CULTURE, BLOOD (ROUTINE X 2)
Culture: NO GROWTH
Culture: NO GROWTH
Special Requests: ADEQUATE
Special Requests: ADEQUATE

## 2017-01-11 LAB — BASIC METABOLIC PANEL
Anion gap: 8 (ref 5–15)
BUN: 11 mg/dL (ref 6–20)
CALCIUM: 8 mg/dL — AB (ref 8.9–10.3)
CO2: 26 mmol/L (ref 22–32)
CREATININE: 1.05 mg/dL (ref 0.61–1.24)
Chloride: 110 mmol/L (ref 101–111)
GFR calc Af Amer: >60 mL/min (ref >60)
GFR calc non Af Amer: >60 mL/min (ref >60)
Glucose, Bld: 135 mg/dL — ABNORMAL HIGH (ref 65–99)
Potassium: 3.6 mmol/L (ref 3.5–5.1)
Sodium: 144 mmol/L (ref 135–145)

## 2017-01-11 LAB — MAGNESIUM: Magnesium: 2 mg/dL (ref 1.7–2.4)

## 2017-01-11 MED ORDER — POTASSIUM CHLORIDE IN NACL 20-0.9 MEQ/L-% IV SOLN
INTRAVENOUS | Status: DC
  Administered 2017-01-11: 05:00:00 via INTRAVENOUS
  Filled 2017-01-11: qty 1000

## 2017-01-11 MED ORDER — POTASSIUM CHLORIDE CRYS ER 20 MEQ PO TBCR
60.0000 meq | EXTENDED_RELEASE_TABLET | Freq: Once | ORAL | Status: AC
  Administered 2017-01-11: 60 meq via ORAL
  Filled 2017-01-11: qty 3

## 2017-01-11 MED ORDER — FUROSEMIDE 10 MG/ML IJ SOLN
40.0000 mg | Freq: Once | INTRAMUSCULAR | Status: AC
  Administered 2017-01-11: 40 mg via INTRAVENOUS
  Filled 2017-01-11: qty 4

## 2017-01-11 NOTE — Progress Notes (Addendum)
PROGRESS NOTE    [name] [name] W [name]  VWU:[id number] DOB: [birth year] DOA: 01/06/2017 PCP: Lucretia Fieldoyals, [place] M   Subjective: Awake and alert, appeared more somnolent today. Has more crackles bilaterally. Ordered nasal suction, will hold IV fluids.  Brief Narrative:   66 y.o.malewith history of hypertension, GERD, hypothyroidism, gout, Down syndrome, nonverbal, anemia, CKD stage III, who presented with worsening mental status and cough.  Patient was hospitalized 05/3 > 5/8 due to sepsis 2/2UTI and proctitis. Pt had blood culture with no growth andurine culture positive forpansentitive Ecoli. Pt was discharged on Augmentin.   Assessment & Plan:   Principal Problem:   Sepsis (HCC) Active Problems:   DOWNS SYNDROME   Pure hypercholesterolemia   Anemia   Acute metabolic encephalopathy   GERD (gastroesophageal reflux disease)   Acute renal failure superimposed on stage 3 chronic kidney disease (HCC)   Aspiration pneumonia (HCC)   Sacral decubitus ulcer, stage III (HCC)   Malnutrition of moderate degree   Severe comorbid illness   Goals of care, counseling/discussion   Palliative care by specialist   Acute metabolic encephalopathy likely secondary to underlying infection, aspiration pneumonia, improved -Cultures have been negative at this time -Continue IV antibiotics-vancomycin/Zosyn -Aspiration precautions.  Failure to thrive/malnutrition of moderate degree -Patient albumin is 1.4, now he is bleeding and developed dysphagia. -Not sure if he will be able to consume the expected nutrients. -Dysphagia, dementia and Down syndrome and other comorbid conditions. --Appreciate palliative help, currently discussing with the thecal guardian to transition to hospice  Dysphagia -Per SLP on dysphagia 1 diet with nectar thick liquids, needs assistance with meals  Anemia, acute on chronic  -Hemoglobin was 12.8, but likely this was lab error, hemoglobin around 9.5 -Hemoglobin dropped further to  6.0. Check stools for occult blood. -CT scan done on 12/20/16 showed proctitis. I appreciate GI evaluation. -We might want to hold on aggressive interventions as patient probably will be transitioned to hospice and comfort care.  Stage III sacral ulcer -Wound care per nursing staff, pressure injury -Per WC nurse documentation: Dark purple deep tissue injuries, Sacrum 4X4cm; Left buttock 4X2cm   Hypernatremia -Likely due to dehydration, change IV fluid to D5W.  Hypoalbuminemia/moderate protein calorie malnutrition -Encourage by mouth diet  Leukocytosis? -He's currently already on IV antibiotics. Continue at this time and monitor his WBC  Down Syndrome with Mod Dementia  -apparently state Rep is his guardian. -Continue to treat for now, if not improving he will need roadmap for re-admission, likely to involve palliative.  Chronic Hypotension -Started on cortisol and compression stockings. Continue.  Hypokalemia - placed wth oral and IV supplements   DVT prophylaxis: Lovenox  Code Status: Full  Family Communication:  No family  Disposition Plan: SDU  Antimicrobials:   Vacn 5/20> 5/21  Cefepime 5/20 >5/21   Zosyn 5/21    Objective: Vitals:   01/11/17 0830 01/11/17 0900 01/11/17 1232 01/11/17 1300  BP:   (!) 85/59 100/63  Pulse:  67 68   Resp:  (!) 25    Temp: 98.5 F (36.9 C)  98.1 F (36.7 C)   TempSrc: Axillary  Axillary   SpO2:  99% 100%   Weight:      Height:        Intake/Output Summary (Last 24 hours) at 01/11/17 1334 Last data filed at 01/11/17 0912  Gross per 24 hour  Intake             1120 ml  Output  1875 ml  Net             -755 ml   Filed Weights   01/07/17 0246  Weight: 49.2 kg (108 lb 7.5 oz)    Examination:  General exam: Appears calm and comfortable. Difficult to understand. Cachectic, frail  Respiratory system: Clear to auscultation. Respiratory effort normal. Cardiovascular system: S1 & S2 heard, RRR. No JVD, murmurs,  rubs, gallops or clicks. No pedal edema. Gastrointestinal system: Abdomen is nondistended, soft and nontender. No organomegaly or masses felt. Normal bowel sounds heard. Central nervous system: Alert and oriented. No focal neurological deficits. Extremities: Symmetric 5 x 5 power. Skin: No rashes, lesions or ulcers Psychiatry: Judgement and insight appear normal. Mood & affect appropriate.     Data Reviewed:   CBC:  Recent Labs Lab 01/06/17 0829 01/06/17 2308 01/08/17 1610 01/09/17 0347 01/10/17 0229 01/11/17 0403  WBC 9.5 11.1* 16.5* 8.6 9.6 9.7  NEUTROABS 6.7 9.5*  --   --   --   --   HGB 9.5* 12.8* 7.3* 6.0* 9.3* 10.2*  HCT 30.1* 41.0 23.3* 19.3* 29.2* 31.6*  MCV 95.3 94.5 94.7 94.1 90.1 90.3  PLT 376 268 273 229 243 262   Basic Metabolic Panel:  Recent Labs Lab 01/08/17 0300 01/08/17 0633 01/09/17 0347 01/10/17 0229 01/11/17 0403  NA 148* 151* 150* 147* 144  K 3.4* 3.6 3.0* 3.2* 3.6  CL 120* 121* 120* 113* 110  CO2 21* 24 23 26 26   GLUCOSE 117* 122* 112* 197* 135*  BUN 17 16 15 13 11   CREATININE 1.23 1.26* 1.15 1.20 1.05  CALCIUM 7.6* 7.6* 7.6* 8.0* 8.0*  MG  --   --  1.6* 1.6* 2.0   GFR: Estimated Creatinine Clearance: 50.1 mL/min (by C-G formula based on SCr of 1.05 mg/dL). Liver Function Tests:  Recent Labs Lab 01/06/17 0829 01/08/17 0300 01/08/17 0633  AST 27 24 21   ALT 14* 13* 12*  ALKPHOS 65 74 70  BILITOT 0.6 0.9 0.9  PROT 6.0* 5.0* 4.8*  ALBUMIN 2.0* 1.6* 1.4*   No results for input(s): LIPASE, AMYLASE in the last 168 hours. No results for input(s): AMMONIA in the last 168 hours. Coagulation Profile:  Recent Labs Lab 01/07/17 0023  INR 1.45   Cardiac Enzymes: No results for input(s): CKTOTAL, CKMB, CKMBINDEX, TROPONINI in the last 168 hours. BNP (last 3 results) No results for input(s): PROBNP in the last 8760 hours. HbA1C: No results for input(s): HGBA1C in the last 72 hours. CBG:  Recent Labs Lab 01/06/17 2220  GLUCAP  157*   Lipid Profile: No results for input(s): CHOL, HDL, LDLCALC, TRIG, CHOLHDL, LDLDIRECT in the last 72 hours. Thyroid Function Tests: No results for input(s): TSH, T4TOTAL, FREET4, T3FREE, THYROIDAB in the last 72 hours. Anemia Panel:  Recent Labs  01/09/17 1845  VITAMINB12 528  FOLATE 10.5  FERRITIN 636*  TIBC 105*  IRON 32*  RETICCTPCT 0.7   Sepsis Labs:  Recent Labs Lab 01/06/17 1130 01/06/17 2318 01/07/17 0023 01/07/17 0301  PROCALCITON  --   --  0.49  --   LATICACIDVEN 1.46 2.16* 1.9 1.6    Recent Results (from the past 240 hour(s))  Blood Culture (routine x 2)     Status: None (Preliminary result)   Collection Time: 01/06/17  8:34 AM  Result Value Ref Range Status   Specimen Description BLOOD RIGHT ANTECUBITAL  Final   Special Requests   Final    BOTTLES DRAWN AEROBIC AND ANAEROBIC Blood Culture  adequate volume   Culture NO GROWTH 4 DAYS  Final   Report Status PENDING  Incomplete  Blood Culture (routine x 2)     Status: None (Preliminary result)   Collection Time: 01/06/17  8:44 AM  Result Value Ref Range Status   Specimen Description BLOOD LEFT HAND  Final   Special Requests   Final    BOTTLES DRAWN AEROBIC ONLY Blood Culture adequate volume   Culture NO GROWTH 4 DAYS  Final   Report Status PENDING  Incomplete  Urine culture     Status: None   Collection Time: 01/06/17 11:18 AM  Result Value Ref Range Status   Specimen Description URINE, RANDOM  Final   Special Requests NONE  Final   Culture NO GROWTH  Final   Report Status 01/07/2017 FINAL  Final  Culture, blood (routine x 2)     Status: None (Preliminary result)   Collection Time: 01/06/17 11:00 PM  Result Value Ref Range Status   Specimen Description BLOOD RIGHT WRIST  Final   Special Requests   Final    BOTTLES DRAWN AEROBIC AND ANAEROBIC Blood Culture adequate volume   Culture NO GROWTH 3 DAYS  Final   Report Status PENDING  Incomplete  Culture, blood (routine x 2)     Status: None  (Preliminary result)   Collection Time: 01/06/17 11:08 PM  Result Value Ref Range Status   Specimen Description BLOOD RIGHT HAND  Final   Special Requests IN PEDIATRIC BOTTLE Blood Culture adequate volume  Final   Culture NO GROWTH 3 DAYS  Final   Report Status PENDING  Incomplete  MRSA PCR Screening     Status: None   Collection Time: 01/07/17  2:45 AM  Result Value Ref Range Status   MRSA by PCR NEGATIVE NEGATIVE Final    Comment:        The GeneXpert MRSA Assay (FDA approved for NASAL specimens only), is one component of a comprehensive MRSA colonization surveillance program. It is not intended to diagnose MRSA infection nor to guide or monitor treatment for MRSA infections.          Radiology Studies: No results found.      Scheduled Meds: . allopurinol  100 mg Oral QHS  . aspirin  81 mg Oral Daily  . chlorhexidine  15 mL Mouth Rinse BID  . clindamycin  1 application Topical Daily  . memantine  28 mg Oral QHS   And  . donepezil  10 mg Oral QHS  . enoxaparin (LOVENOX) injection  30 mg Subcutaneous Q24H  . fluticasone  1 spray Each Nare Daily  . levothyroxine  25 mcg Oral QAC breakfast  . mouth rinse  15 mL Mouth Rinse q12n4p  . Melatonin  3 mg Oral QHS  . midodrine  2.5 mg Oral BID WC  . mineral oil-hydrophilic petrolatum  1 application Topical BID  . pantoprazole  40 mg Oral Q1200  . pravastatin  20 mg Oral QHS  . tamsulosin  0.4 mg Oral Daily   Continuous Infusions: . 0.9 % NaCl with KCl 20 mEq / L 75 mL/hr at 01/11/17 0522  . piperacillin-tazobactam (ZOSYN)  IV 3.375 g (01/11/17 1319)     LOS: 5 days    Time spent: 35 mins     Misael Mcgaha A, MD [facility] Pager [phone]  If 7PM-7AM, please contact night-coverage [email or web address] Password [email or web address] 01/11/2017, 1:34 PM

## 2017-01-11 NOTE — Progress Notes (Signed)
   Notary services provided.

## 2017-01-11 NOTE — Progress Notes (Signed)
Palliative Care  Assessment:  [name] [name] appears similar to yesterday. He remains minimally interactive and is unable to communicate his needs. I spoke with his state appointed legal guardian yesterday, [name] [name]. In that conversation I shared my concerns surrounding his failure to thrive with significant weight loss and signs of nutritional deficiency (Albumin 1.4). In light of his recent aspiration pneumonia with identified dysphagia, I also shared that I believe he remains at high risk for recurrent infection and acute deterioration. [name] expressed the overall goal for [name] [name] is to ensure comfort and dignity. In alignment with that goal, I recommended DNR status, no feeding tube, and utilization of Hospice services in his home environment.   Today, in accordance with state policy, recommendations for care were formally made by Palliative MD and Primary team MD and [facility] I then called [name] to let her know this paperwork had been faxed back to her. We talked again about the DNR recommendation and she had no questions or need for clarification. She had not yet been able to speak with [name], but was hopeful to speak with him today. She has my team's contact information and indicated she would call with any questions, concerns, or need for clarification. Given the holiday weekend, I will plan to follow-up with her on Tuesday regarding the decision on code status and disposition.  Plan -[facility] reviewing paperwork and recommendation, I will plan to follow-up with her on Tuesday  **Please call Palliative team at [phone] for any weekend needs.    Time in: 1240 Time out: 1310 Total time: 70 minutes Greater than 50%  of this time was spent counseling and coordinating care related to the above assessment and plan.  Murrell ConverseSarah Magdeline Prange [facility] CenterGNP-C Palliative Care [phone]

## 2017-01-11 NOTE — Progress Notes (Signed)
Nutrition Follow Up  DOCUMENTATION CODES:   Non-severe (moderate) malnutrition in context of chronic illness  INTERVENTION:    Continue Mighty Shake II TID with meals, each supplement provides 480-500 kcals and 20-23 grams of protein  NUTRITION DIAGNOSIS:   Malnutrition (moderate) related to chronic illness (Down's syndrome, dementia) as evidenced by moderate depletion of body fat, moderate depletions of muscle mass, ongoing  GOAL:   Patient will meet greater than or equal to 90% of their needs, met  MONITOR:   PO intake, Supplement acceptance, Labs, Weight trends, Skin, I & O's  ASSESSMENT:    66 y.o. Male with medical history significant of hypertension, GERD, hypothyroidism, gout, Down syndrome, mental retardation, nonverbal, anemia, chronic kidney disease-stage III, who presents with worsening mental status and cough.  Pt remains on a Dys 1-Nectar thick liquid diet. Speech Path working with pt this AM.  PO intake 100% at breakfast. Palliative Care Team following.  Rec Hospice at discharge. Receiving Mighty Shake II supplement on meal trays. Labs and medications reviewed.  Diet Order:  DIET - DYS 1 Room service appropriate? Yes; Fluid consistency: Nectar Thick  Skin:  Wound (see comment) (dark purple deep tissue injuries to sacrum & buttock)  Last BM:  5/24  Height:   Ht Readings from Last 1 Encounters:  01/07/17 5' (1.524 m)   Weight:   Wt Readings from Last 1 Encounters:  01/07/17 108 lb 7.5 oz (49.2 kg)   Ideal Body Weight:  45 kg  BMI:  Body mass index is 21.18 kg/m.  Estimated Nutritional Needs:   Kcal:  1400-1600  Protein:  65-75 gm  Fluid:  >/= 1.5 L  EDUCATION NEEDS:   No education needs identified at this time  Arthur Holms, RD, LDN Pager #: [phone] After-Hours Pager #: [phone]

## 2017-01-11 NOTE — Progress Notes (Signed)
  Speech Language Pathology Treatment: Dysphagia  Patient Details Name: [name] [name] MRN: [id number] DOB: 10-06-1950 Today's Date: 01/11/2017 Time: 0910-0940 SLP Time Calculation (min) (ACUTE ONLY): 30 min  Assessment / Plan / Recommendation Clinical Impression  Pt fully alert and attentive to PO today. Consumed 100% of puree/nectar breakfast tray with total assist and was also able to take cup sips with visual and tactile cues. Though he had rhonchorous breathing prior to session and was deep suctioned by RT, pts breath sounds did not appear to change during session, nor did he cough at all. Pt vocalized "yes" mid meal and vocal quality was clear. Discussed liberalization of feeding methods with RN, though pt will need to continue nectar thick liquids unless palliative care team and family/guardian decide to fully transition to comfort. Pt does not seem to mind thickened liquids, so feel it is appropriate to continue through d/c to reduce risk of aspiration given delay in initiation. No further SLP interventions needed acutely.   HPI        SLP Plan  All goals met       Recommendations  Diet recommendations: Dysphagia 1 (puree);Nectar-thick liquid Liquids provided via: Cup;Teaspoon Medication Administration: Crushed with puree Supervision: Trained caregiver to feed patient;Full supervision/cueing for compensatory strategies Compensations: Slow rate;Small sips/bites Postural Changes and/or Swallow Maneuvers: Seated upright 90 degrees;Upright 30-60 min after meal                Oral Care Recommendations: Oral care BID Follow up Recommendations: Skilled Nursing facility;Home health SLP SLP Visit Diagnosis: Dysphagia, oropharyngeal phase (R13.12) Plan: All goals met       GO               Herbie Baltimore, MA CCC-SLP [phone]  Lynann Beaver 01/11/2017, 9:57 AM

## 2017-01-12 DIAGNOSIS — R532 Functional quadriplegia: Secondary | ICD-10-CM

## 2017-01-12 LAB — CULTURE, BLOOD (ROUTINE X 2)
CULTURE: NO GROWTH
Culture: NO GROWTH
SPECIAL REQUESTS: ADEQUATE
Special Requests: ADEQUATE

## 2017-01-12 LAB — BASIC METABOLIC PANEL
ANION GAP: 7 (ref 5–15)
BUN: 12 mg/dL (ref 6–20)
CO2: 27 mmol/L (ref 22–32)
Calcium: 8.1 mg/dL — ABNORMAL LOW (ref 8.9–10.3)
Chloride: 110 mmol/L (ref 101–111)
Creatinine, Ser: 1.07 mg/dL (ref 0.61–1.24)
Glucose, Bld: 166 mg/dL — ABNORMAL HIGH (ref 65–99)
Potassium: 4.2 mmol/L (ref 3.5–5.1)
Sodium: 144 mmol/L (ref 135–145)

## 2017-01-12 LAB — CBC
HCT: 33.8 % — ABNORMAL LOW (ref 39.0–52.0)
HEMOGLOBIN: 11 g/dL — AB (ref 13.0–17.0)
MCH: 29.7 pg (ref 26.0–34.0)
MCHC: 32.5 g/dL (ref 30.0–36.0)
MCV: 91.4 fL (ref 78.0–100.0)
Platelets: 251 10*3/uL (ref 150–400)
RBC: 3.7 MIL/uL — AB (ref 4.22–5.81)
RDW: 15.7 % — ABNORMAL HIGH (ref 11.5–15.5)
WBC: 8.3 10*3/uL (ref 4.0–10.5)

## 2017-01-12 LAB — GLUCOSE, CAPILLARY: GLUCOSE-CAPILLARY: 175 mg/dL — AB (ref 65–99)

## 2017-01-12 NOTE — Progress Notes (Signed)
PROGRESS NOTE    [name] [name]  NWG:[id number] DOB: [birth year] DOA: 01/06/2017 PCP: Lucretia Field   Subjective: I appreciate palliative medicine team help, patient is likely will be transitioned to hospice. For now continue current care,. Crackles better than yesterday but his throat is full of secretions, continue nasal suction.  Brief Narrative:   66 y.o.malewith history of hypertension, GERD, hypothyroidism, gout, Down syndrome, nonverbal, anemia, CKD stage III, who presented with worsening mental status and cough.  Patient was hospitalized 05/3 > 5/8 due to sepsis 2/2UTI and proctitis. Pt had blood culture with no growth andurine culture positive forpansentitive Ecoli. Pt was discharged on Augmentin.   Assessment & Plan:   Principal Problem:   Sepsis (HCC) Active Problems:   DOWNS SYNDROME   Pure hypercholesterolemia   Anemia   Acute metabolic encephalopathy   GERD (gastroesophageal reflux disease)   Acute renal failure superimposed on stage 3 chronic kidney disease (HCC)   Aspiration pneumonia (HCC)   Sacral decubitus ulcer, stage III (HCC)   Malnutrition of moderate degree   Severe comorbid illness   Goals of care, counseling/discussion   Palliative care by specialist   Functional quadriplegia (HCC)   Acute metabolic encephalopathy likely secondary to underlying infection, aspiration pneumonia, improved -Cultures have been negative at this time -Continue IV antibiotics-vancomycin/Zosyn -Aspiration precautions. -Awake and alert but non-conversant, mumbles.  Failure to thrive/malnutrition of moderate degree -Patient albumin is 1.4, now he is bleeding and developed dysphagia. -Not sure if he will be able to consume the expected amount nutrients that should support him. -Dysphagia, dementia and Down syndrome and other comorbid conditions. -Appreciate palliative help, currently discussing with the patient's legal guardian to transition to  hospice  Dysphagia -Per SLP on dysphagia 1 diet with nectar thick liquids, needs assistance with meals  Anemia, acute on chronic  -Hemoglobin was 12.8, but likely this was lab error, hemoglobin around 9.5 -Hemoglobin dropped further to 6.0. Check stools for occult blood. -CT scan done on 12/20/16 showed proctitis. I appreciate GI evaluation. -We might want to hold on aggressive interventions as patient probably will be transitioned to hospice and comfort care.  Stage III sacral ulcer -Wound care per nursing staff, pressure injury -Per WC nurse documentation: Dark purple deep tissue injuries, Sacrum 4X4cm; Left buttock 4X2cm   Hypernatremia -Likely due to dehydration, change IV fluid to D5W.  Hypoalbuminemia/moderate protein calorie malnutrition -Encourage by mouth diet  Leukocytosis? -He's currently already on IV antibiotics. Continue at this time and monitor his WBC  Down Syndrome with Mod Dementia  -apparently state Rep is his guardian. -Continue to treat for now, if not improving he will need roadmap for re-admission, likely to involve palliative.  Chronic Hypotension -Started on cortisol and compression stockings. Continue.  Hypokalemia - placed wth oral and IV supplements   DVT prophylaxis: Lovenox  Code Status: Full  Family Communication:  No family  Disposition Plan: SDU  Antimicrobials:   Vacn 5/20> 5/21  Cefepime 5/20 >5/21   Zosyn 5/21    Objective: Vitals:   01/12/17 0401 01/12/17 0422 01/12/17 0609 01/12/17 0800  BP: 93/66 93/66 (!) 110/57 111/65  Pulse: 65 65 72 62  Resp: 20 19 19 18   Temp:  98.3 F (36.8 C)  98.7 F (37.1 C)  TempSrc:  Axillary  Axillary  SpO2: (!) 86% 93% 91% 95%  Weight:      Height:        Intake/Output Summary (Last 24 hours) at 01/12/17 1035 Last data filed at  01/12/17 0800  Gross per 24 hour  Intake              150 ml  Output             2575 ml  Net            -2425 ml   Filed Weights   01/07/17 0246   Weight: 49.2 kg (108 lb 7.5 oz)    Examination:  General exam: Appears calm and comfortable. Difficult to understand. Cachectic, frail  Respiratory system: Clear to auscultation. Respiratory effort normal. Cardiovascular system: S1 & S2 heard, RRR. No JVD, murmurs, rubs, gallops or clicks. No pedal edema. Gastrointestinal system: Abdomen is nondistended, soft and nontender. No organomegaly or masses felt. Normal bowel sounds heard. Central nervous system: Alert and oriented. No focal neurological deficits. Extremities: Symmetric 5 x 5 power. Skin: No rashes, lesions or ulcers Psychiatry: Judgement and insight appear normal. Mood & affect appropriate.     Data Reviewed:   CBC:  Recent Labs Lab 01/06/17 0829 01/06/17 2308 01/08/17 1610 01/09/17 0347 01/10/17 0229 01/11/17 0403 01/12/17 0227  WBC 9.5 11.1* 16.5* 8.6 9.6 9.7 8.3  NEUTROABS 6.7 9.5*  --   --   --   --   --   HGB 9.5* 12.8* 7.3* 6.0* 9.3* 10.2* 11.0*  HCT 30.1* 41.0 23.3* 19.3* 29.2* 31.6* 33.8*  MCV 95.3 94.5 94.7 94.1 90.1 90.3 91.4  PLT 376 268 273 229 243 262 251   Basic Metabolic Panel:  Recent Labs Lab 01/08/17 0633 01/09/17 0347 01/10/17 0229 01/11/17 0403 01/12/17 0227  NA 151* 150* 147* 144 144  K 3.6 3.0* 3.2* 3.6 4.2  CL 121* 120* 113* 110 110  CO2 24 23 26 26 27   GLUCOSE 122* 112* 197* 135* 166*  BUN 16 15 13 11 12   CREATININE 1.26* 1.15 1.20 1.05 1.07  CALCIUM 7.6* 7.6* 8.0* 8.0* 8.1*  MG  --  1.6* 1.6* 2.0  --    GFR: Estimated Creatinine Clearance: 49.2 mL/min (by C-G formula based on SCr of 1.07 mg/dL). Liver Function Tests:  Recent Labs Lab 01/06/17 0829 01/08/17 0300 01/08/17 0633  AST 27 24 21   ALT 14* 13* 12*  ALKPHOS 65 74 70  BILITOT 0.6 0.9 0.9  PROT 6.0* 5.0* 4.8*  ALBUMIN 2.0* 1.6* 1.4*   No results for input(s): LIPASE, AMYLASE in the last 168 hours. No results for input(s): AMMONIA in the last 168 hours. Coagulation Profile:  Recent Labs Lab  01/07/17 0023  INR 1.45   Cardiac Enzymes: No results for input(s): CKTOTAL, CKMB, CKMBINDEX, TROPONINI in the last 168 hours. BNP (last 3 results) No results for input(s): PROBNP in the last 8760 hours. HbA1C: No results for input(s): HGBA1C in the last 72 hours. CBG:  Recent Labs Lab 01/06/17 2220  GLUCAP 157*   Lipid Profile: No results for input(s): CHOL, HDL, LDLCALC, TRIG, CHOLHDL, LDLDIRECT in the last 72 hours. Thyroid Function Tests: No results for input(s): TSH, T4TOTAL, FREET4, T3FREE, THYROIDAB in the last 72 hours. Anemia Panel:  Recent Labs  01/09/17 1845  VITAMINB12 528  FOLATE 10.5  FERRITIN 636*  TIBC 105*  IRON 32*  RETICCTPCT 0.7   Sepsis Labs:  Recent Labs Lab 01/06/17 1130 01/06/17 2318 01/07/17 0023 01/07/17 0301  PROCALCITON  --   --  0.49  --   LATICACIDVEN 1.46 2.16* 1.9 1.6    Recent Results (from the past 240 hour(s))  Blood Culture (routine x 2)  Status: None   Collection Time: 01/06/17  8:34 AM  Result Value Ref Range Status   Specimen Description BLOOD RIGHT ANTECUBITAL  Final   Special Requests   Final    BOTTLES DRAWN AEROBIC AND ANAEROBIC Blood Culture adequate volume   Culture NO GROWTH 5 DAYS  Final   Report Status 01/11/2017 FINAL  Final  Blood Culture (routine x 2)     Status: None   Collection Time: 01/06/17  8:44 AM  Result Value Ref Range Status   Specimen Description BLOOD LEFT HAND  Final   Special Requests   Final    BOTTLES DRAWN AEROBIC ONLY Blood Culture adequate volume   Culture NO GROWTH 5 DAYS  Final   Report Status 01/11/2017 FINAL  Final  Urine culture     Status: None   Collection Time: 01/06/17 11:18 AM  Result Value Ref Range Status   Specimen Description URINE, RANDOM  Final   Special Requests NONE  Final   Culture NO GROWTH  Final   Report Status 01/07/2017 FINAL  Final  Culture, blood (routine x 2)     Status: None (Preliminary result)   Collection Time: 01/06/17 11:00 PM  Result Value  Ref Range Status   Specimen Description BLOOD RIGHT WRIST  Final   Special Requests   Final    BOTTLES DRAWN AEROBIC AND ANAEROBIC Blood Culture adequate volume   Culture NO GROWTH 4 DAYS  Final   Report Status PENDING  Incomplete  Culture, blood (routine x 2)     Status: None (Preliminary result)   Collection Time: 01/06/17 11:08 PM  Result Value Ref Range Status   Specimen Description BLOOD RIGHT HAND  Final   Special Requests IN PEDIATRIC BOTTLE Blood Culture adequate volume  Final   Culture NO GROWTH 4 DAYS  Final   Report Status PENDING  Incomplete  MRSA PCR Screening     Status: None   Collection Time: 01/07/17  2:45 AM  Result Value Ref Range Status   MRSA by PCR NEGATIVE NEGATIVE Final    Comment:        The GeneXpert MRSA Assay (FDA approved for NASAL specimens only), is one component of a comprehensive MRSA colonization surveillance program. It is not intended to diagnose MRSA infection nor to guide or monitor treatment for MRSA infections.          Radiology Studies: No results found.      Scheduled Meds: . allopurinol  100 mg Oral QHS  . aspirin  81 mg Oral Daily  . chlorhexidine  15 mL Mouth Rinse BID  . clindamycin  1 application Topical Daily  . memantine  28 mg Oral QHS   And  . donepezil  10 mg Oral QHS  . enoxaparin (LOVENOX) injection  30 mg Subcutaneous Q24H  . fluticasone  1 spray Each Nare Daily  . levothyroxine  25 mcg Oral QAC breakfast  . mouth rinse  15 mL Mouth Rinse q12n4p  . Melatonin  3 mg Oral QHS  . midodrine  2.5 mg Oral BID WC  . mineral oil-hydrophilic petrolatum  1 application Topical BID  . pantoprazole  40 mg Oral Q1200  . pravastatin  20 mg Oral QHS  . tamsulosin  0.4 mg Oral Daily   Continuous Infusions: . piperacillin-tazobactam (ZOSYN)  IV 3.375 g (01/12/17 0525)     LOS: 6 days    Time spent: 35 mins     Lofton Leon A, MD [facility] Pager [phone]  If 7PM-7AM, please contact  night-coverage [email or web address] Password [email or web address] 01/12/2017, 10:35 AM

## 2017-01-12 NOTE — Clinical Social Work Note (Signed)
Clinical Social Worker continuing to follow patient and family for support and discharge planning needs.  Per MD note, patient will likely transition to Hospice during hospitalization.  Patient with copious secretions and inability to manage.  Plan was originally for patient to return to [facility] group home, however discharge plans will likely change if patient transitioned to Hospice and stable for discharge.  CSW remains available for support and to facilitate patient discharge needs.  Macario GoldsJesse Annetta Deiss, LCSW (Weekend Coverage) [phone]

## 2017-01-13 NOTE — Progress Notes (Signed)
PROGRESS NOTE    [name] [name]  UEA:[id number] DOB: [birth year] DOA: 01/06/2017 PCP: Lucretia Field   Subjective: Continue nasal deep suction, continue current antibiotics. Discussing with the legal guardian to transition to hospice, likely going to happen on Tuesday.  Brief Narrative:   66 y.o.malewith history of hypertension, GERD, hypothyroidism, gout, Down syndrome, nonverbal, anemia, CKD stage III, who presented with worsening mental status and cough.  Patient was hospitalized 05/3 > 5/8 due to sepsis 2/2UTI and proctitis. Pt had blood culture with no growth andurine culture positive forpansentitive Ecoli. Pt was discharged on Augmentin.   Assessment & Plan:   Principal Problem:   Sepsis (HCC) Active Problems:   DOWNS SYNDROME   Pure hypercholesterolemia   Anemia   Acute metabolic encephalopathy   GERD (gastroesophageal reflux disease)   Acute renal failure superimposed on stage 3 chronic kidney disease (HCC)   Aspiration pneumonia (HCC)   Sacral decubitus ulcer, stage III (HCC)   Malnutrition of moderate degree   Severe comorbid illness   Goals of care, counseling/discussion   Palliative care by specialist   Functional quadriplegia (HCC)   Acute metabolic encephalopathy likely secondary to underlying infection, aspiration pneumonia, improved -Cultures have been negative at this time -Continue IV antibiotics-vancomycin/Zosyn -Aspiration precautions. -Continue current antibiotics.  Failure to thrive/malnutrition of moderate degree -Patient albumin is 1.4, now he is bleeding and developed dysphagia. -Not sure if he will be able to consume the expected amount nutrients that should support him. -Dysphagia, dementia and Down syndrome and other comorbid conditions. -Appreciate palliative help, currently discussing with the patient's legal guardian to transition to hospice  Dysphagia -Per SLP on dysphagia 1 diet with nectar thick liquids, needs assistance with  meals  Anemia, acute on chronic  -Hemoglobin was 12.8, but likely this was lab error, hemoglobin around 9.5 -Hemoglobin dropped further to 6.0. Check stools for occult blood. -CT scan done on 12/20/16 showed proctitis. I appreciate GI evaluation. -We might want to hold on aggressive interventions as patient probably will be transitioned to hospice and comfort care.  Stage III sacral ulcer -Wound care per nursing staff, pressure injury -Per WC nurse documentation: Dark purple deep tissue injuries, Sacrum 4X4cm; Left buttock 4X2cm   Hypernatremia -Likely due to dehydration, change IV fluid to D5W.  Hypoalbuminemia/moderate protein calorie malnutrition -Encourage by mouth diet  Leukocytosis? -He's currently already on IV antibiotics. Continue at this time and monitor his WBC  Down Syndrome with Mod Dementia  -apparently state Rep is his guardian. -Continue to treat for now, if not improving he will need roadmap for re-admission, likely to involve palliative.  Chronic Hypotension -Started on cortisol and compression stockings. Continue.  Hypokalemia - placed wth oral and IV supplements   DVT prophylaxis: Lovenox  Code Status: Full  Family Communication:  No family  Disposition Plan: SDU  Antimicrobials:   Vacn 5/20> 5/21  Cefepime 5/20 >5/21   Zosyn 5/21    Objective: Vitals:   01/13/17 0308 01/13/17 0405 01/13/17 0732 01/13/17 1200  BP:  95/68 (!) 90/50 (!) 84/60  Pulse:  68  66  Resp:  (!) 28  19  Temp: 99 F (37.2 C)  99.2 F (37.3 C) 98.2 F (36.8 C)  TempSrc: Axillary  Axillary Axillary  SpO2:  92%  93%  Weight:      Height:        Intake/Output Summary (Last 24 hours) at 01/13/17 1310 Last data filed at 01/13/17 1200  Gross per 24 hour  Intake  300 ml  Output              445 ml  Net             -145 ml   Filed Weights   01/07/17 0246  Weight: 49.2 kg (108 lb 7.5 oz)    Examination:  General exam: Appears calm and comfortable.  Difficult to understand. Cachectic, frail  Respiratory system: Clear to auscultation. Respiratory effort normal. Cardiovascular system: S1 & S2 heard, RRR. No JVD, murmurs, rubs, gallops or clicks. No pedal edema. Gastrointestinal system: Abdomen is nondistended, soft and nontender. No organomegaly or masses felt. Normal bowel sounds heard. Central nervous system: Alert and oriented. No focal neurological deficits. Extremities: Symmetric 5 x 5 power. Skin: No rashes, lesions or ulcers Psychiatry: Judgement and insight appear normal. Mood & affect appropriate.     Data Reviewed:   CBC:  Recent Labs Lab 01/06/17 2308 01/08/17 4098 01/09/17 0347 01/10/17 0229 01/11/17 0403 01/12/17 0227  WBC 11.1* 16.5* 8.6 9.6 9.7 8.3  NEUTROABS 9.5*  --   --   --   --   --   HGB 12.8* 7.3* 6.0* 9.3* 10.2* 11.0*  HCT 41.0 23.3* 19.3* 29.2* 31.6* 33.8*  MCV 94.5 94.7 94.1 90.1 90.3 91.4  PLT 268 273 229 243 262 251   Basic Metabolic Panel:  Recent Labs Lab 01/08/17 0633 01/09/17 0347 01/10/17 0229 01/11/17 0403 01/12/17 0227  NA 151* 150* 147* 144 144  K 3.6 3.0* 3.2* 3.6 4.2  CL 121* 120* 113* 110 110  CO2 24 23 26 26 27   GLUCOSE 122* 112* 197* 135* 166*  BUN 16 15 13 11 12   CREATININE 1.26* 1.15 1.20 1.05 1.07  CALCIUM 7.6* 7.6* 8.0* 8.0* 8.1*  MG  --  1.6* 1.6* 2.0  --    GFR: Estimated Creatinine Clearance: 49.2 mL/min (by C-G formula based on SCr of 1.07 mg/dL). Liver Function Tests:  Recent Labs Lab 01/08/17 0300 01/08/17 0633  AST 24 21  ALT 13* 12*  ALKPHOS 74 70  BILITOT 0.9 0.9  PROT 5.0* 4.8*  ALBUMIN 1.6* 1.4*   No results for input(s): LIPASE, AMYLASE in the last 168 hours. No results for input(s): AMMONIA in the last 168 hours. Coagulation Profile:  Recent Labs Lab 01/07/17 0023  INR 1.45   Cardiac Enzymes: No results for input(s): CKTOTAL, CKMB, CKMBINDEX, TROPONINI in the last 168 hours. BNP (last 3 results) No results for input(s): PROBNP in  the last 8760 hours. HbA1C: No results for input(s): HGBA1C in the last 72 hours. CBG:  Recent Labs Lab 01/06/17 2220 01/12/17 1551  GLUCAP 157* 175*   Lipid Profile: No results for input(s): CHOL, HDL, LDLCALC, TRIG, CHOLHDL, LDLDIRECT in the last 72 hours. Thyroid Function Tests: No results for input(s): TSH, T4TOTAL, FREET4, T3FREE, THYROIDAB in the last 72 hours. Anemia Panel: No results for input(s): VITAMINB12, FOLATE, FERRITIN, TIBC, IRON, RETICCTPCT in the last 72 hours. Sepsis Labs:  Recent Labs Lab 01/06/17 2318 01/07/17 0023 01/07/17 0301  PROCALCITON  --  0.49  --   LATICACIDVEN 2.16* 1.9 1.6    Recent Results (from the past 240 hour(s))  Blood Culture (routine x 2)     Status: None   Collection Time: 01/06/17  8:34 AM  Result Value Ref Range Status   Specimen Description BLOOD RIGHT ANTECUBITAL  Final   Special Requests   Final    BOTTLES DRAWN AEROBIC AND ANAEROBIC Blood Culture adequate volume   Culture  NO GROWTH 5 DAYS  Final   Report Status 01/11/2017 FINAL  Final  Blood Culture (routine x 2)     Status: None   Collection Time: 01/06/17  8:44 AM  Result Value Ref Range Status   Specimen Description BLOOD LEFT HAND  Final   Special Requests   Final    BOTTLES DRAWN AEROBIC ONLY Blood Culture adequate volume   Culture NO GROWTH 5 DAYS  Final   Report Status 01/11/2017 FINAL  Final  Urine culture     Status: None   Collection Time: 01/06/17 11:18 AM  Result Value Ref Range Status   Specimen Description URINE, RANDOM  Final   Special Requests NONE  Final   Culture NO GROWTH  Final   Report Status 01/07/2017 FINAL  Final  Culture, blood (routine x 2)     Status: None   Collection Time: 01/06/17 11:00 PM  Result Value Ref Range Status   Specimen Description BLOOD RIGHT WRIST  Final   Special Requests   Final    BOTTLES DRAWN AEROBIC AND ANAEROBIC Blood Culture adequate volume   Culture NO GROWTH 5 DAYS  Final   Report Status 01/12/2017 FINAL   Final  Culture, blood (routine x 2)     Status: None   Collection Time: 01/06/17 11:08 PM  Result Value Ref Range Status   Specimen Description BLOOD RIGHT HAND  Final   Special Requests IN PEDIATRIC BOTTLE Blood Culture adequate volume  Final   Culture NO GROWTH 5 DAYS  Final   Report Status 01/12/2017 FINAL  Final  MRSA PCR Screening     Status: None   Collection Time: 01/07/17  2:45 AM  Result Value Ref Range Status   MRSA by PCR NEGATIVE NEGATIVE Final    Comment:        The GeneXpert MRSA Assay (FDA approved for NASAL specimens only), is one component of a comprehensive MRSA colonization surveillance program. It is not intended to diagnose MRSA infection nor to guide or monitor treatment for MRSA infections.          Radiology Studies: No results found.      Scheduled Meds: . allopurinol  100 mg Oral QHS  . aspirin  81 mg Oral Daily  . chlorhexidine  15 mL Mouth Rinse BID  . clindamycin  1 application Topical Daily  . memantine  28 mg Oral QHS   And  . donepezil  10 mg Oral QHS  . enoxaparin (LOVENOX) injection  30 mg Subcutaneous Q24H  . fluticasone  1 spray Each Nare Daily  . levothyroxine  25 mcg Oral QAC breakfast  . mouth rinse  15 mL Mouth Rinse q12n4p  . Melatonin  3 mg Oral QHS  . midodrine  2.5 mg Oral BID WC  . mineral oil-hydrophilic petrolatum  1 application Topical BID  . pantoprazole  40 mg Oral Q1200  . pravastatin  20 mg Oral QHS  . tamsulosin  0.4 mg Oral Daily   Continuous Infusions: . piperacillin-tazobactam (ZOSYN)  IV Stopped (01/13/17 0947)     LOS: 7 days    Time spent: 35 mins     Jeniel Slauson A, MD [facility] Pager [phone]  If 7PM-7AM, please contact night-coverage [email or web address] Password [email or web address] 01/13/2017, 1:10 PM

## 2017-01-14 LAB — BASIC METABOLIC PANEL
Anion gap: 11 (ref 5–15)
BUN: 17 mg/dL (ref 6–20)
CO2: 26 mmol/L (ref 22–32)
CREATININE: 1.07 mg/dL (ref 0.61–1.24)
Calcium: 8.7 mg/dL — ABNORMAL LOW (ref 8.9–10.3)
Chloride: 106 mmol/L (ref 101–111)
Glucose, Bld: 124 mg/dL — ABNORMAL HIGH (ref 65–99)
Potassium: 4.6 mmol/L (ref 3.5–5.1)
SODIUM: 143 mmol/L (ref 135–145)

## 2017-01-14 NOTE — Progress Notes (Signed)
PROGRESS NOTE    [name] [name]  AVW:[id number] DOB: 25-Jan-1951 DOA: 01/06/2017 PCP: Lucretia Fieldoyals, [place] M   Subjective: No changes clinically, he is awake and alert but mumbles nonconversant. Await final recommendation from palliative medicine team in a.m. If lesion guardian agreeable patient will be appropriate for hospice home.  Brief Narrative:   66 y.o.malewith history of hypertension, GERD, hypothyroidism, gout, Down syndrome, nonverbal, anemia, CKD stage III, who presented with worsening mental status and cough.  Patient was hospitalized 05/3 > 5/8 due to sepsis 2/2UTI and proctitis. Pt had blood culture with no growth andurine culture positive forpansentitive Ecoli. Pt was discharged on Augmentin.   Assessment & Plan:   Principal Problem:   Sepsis (HCC) Active Problems:   DOWNS SYNDROME   Pure hypercholesterolemia   Anemia   Acute metabolic encephalopathy   GERD (gastroesophageal reflux disease)   Acute renal failure superimposed on stage 3 chronic kidney disease (HCC)   Aspiration pneumonia (HCC)   Sacral decubitus ulcer, stage III (HCC)   Malnutrition of moderate degree   Severe comorbid illness   Goals of care, counseling/discussion   Palliative care by specialist   Functional quadriplegia (HCC)   Acute metabolic encephalopathy likely secondary to underlying infection, aspiration pneumonia, improved -Cultures have been negative at this time -Continue IV antibiotics-vancomycin/Zosyn -Aspiration precautions. -Continue current antibiotics.  Failure to thrive/malnutrition of moderate degree -Patient albumin is 1.4, now he is bleeding and developed dysphagia. -Not sure if he will be able to consume the expected amount nutrients that should support him. -Dysphagia, dementia and Down syndrome and other comorbid conditions. -Appreciate palliative help, currently discussing with the patient's legal guardian to transition to hospice  Dysphagia -Per SLP on dysphagia 1  diet with nectar thick liquids, needs assistance with meals  Anemia, acute on chronic  -Hemoglobin was 12.8, but likely this was lab error, hemoglobin around 9.5 -Hemoglobin dropped further to 6.0. Check stools for occult blood. -CT scan done on 12/20/16 showed proctitis. I appreciate GI evaluation. -We might want to hold on aggressive interventions as patient probably will be transitioned to hospice and comfort care.  Stage III sacral ulcer -Wound care per nursing staff, pressure injury -Per WC nurse documentation: Dark purple deep tissue injuries, Sacrum 4X4cm; Left buttock 4X2cm   Hypernatremia -Likely due to dehydration, change IV fluid to D5W.  Hypoalbuminemia/moderate protein calorie malnutrition -Encourage by mouth diet  Leukocytosis? -He's currently already on IV antibiotics. Continue at this time and monitor his WBC  Down Syndrome with Mod Dementia  -apparently state Rep is his guardian. -Continue to treat for now, if not improving he will need roadmap for re-admission, likely to involve palliative.  Chronic Hypotension -Started on cortisol and compression stockings. Continue.  Hypokalemia - placed wth oral and IV supplements   DVT prophylaxis: Lovenox  Code Status: Full  Family Communication:  No family  Disposition Plan: SDU  Antimicrobials:   Vacn 5/20> 5/21  Cefepime 5/20 >5/21   Zosyn 5/21    Objective: Vitals:   01/14/17 0033 01/14/17 0428 01/14/17 0807 01/14/17 1000  BP: 116/61 97/64 100/75 106/74  Pulse:  66 66 60  Resp: (!) 28 15  (!) 23  Temp:  98.4 F (36.9 C) 98.7 F (37.1 C)   TempSrc:  Oral Oral   SpO2: 91%  93% 98%  Weight:      Height:        Intake/Output Summary (Last 24 hours) at 01/14/17 1219 Last data filed at 01/14/17 1100  Gross per  24 hour  Intake              820 ml  Output              980 ml  Net             -160 ml   Filed Weights   01/07/17 0246  Weight: 49.2 kg (108 lb 7.5 oz)    Examination:  General  exam: Appears calm and comfortable. Difficult to understand. Cachectic, frail  Respiratory system: Clear to auscultation. Respiratory effort normal. Cardiovascular system: S1 & S2 heard, RRR. No JVD, murmurs, rubs, gallops or clicks. No pedal edema. Gastrointestinal system: Abdomen is nondistended, soft and nontender. No organomegaly or masses felt. Normal bowel sounds heard. Central nervous system: Alert and oriented. No focal neurological deficits. Extremities: Symmetric 5 x 5 power. Skin: No rashes, lesions or ulcers Psychiatry: Judgement and insight appear normal. Mood & affect appropriate.     Data Reviewed:   CBC:  Recent Labs Lab 01/08/17 0633 01/09/17 0347 01/10/17 0229 01/11/17 0403 01/12/17 0227  WBC 16.5* 8.6 9.6 9.7 8.3  HGB 7.3* 6.0* 9.3* 10.2* 11.0*  HCT 23.3* 19.3* 29.2* 31.6* 33.8*  MCV 94.7 94.1 90.1 90.3 91.4  PLT 273 229 243 262 251   Basic Metabolic Panel:  Recent Labs Lab 01/09/17 0347 01/10/17 0229 01/11/17 0403 01/12/17 0227 01/14/17 0809  NA 150* 147* 144 144 143  K 3.0* 3.2* 3.6 4.2 4.6  CL 120* 113* 110 110 106  CO2 23 26 26 27 26   GLUCOSE 112* 197* 135* 166* 124*  BUN 15 13 11 12 17   CREATININE 1.15 1.20 1.05 1.07 1.07  CALCIUM 7.6* 8.0* 8.0* 8.1* 8.7*  MG 1.6* 1.6* 2.0  --   --    GFR: Estimated Creatinine Clearance: 49.2 mL/min (by C-G formula based on SCr of 1.07 mg/dL). Liver Function Tests:  Recent Labs Lab 01/08/17 0300 01/08/17 0633  AST 24 21  ALT 13* 12*  ALKPHOS 74 70  BILITOT 0.9 0.9  PROT 5.0* 4.8*  ALBUMIN 1.6* 1.4*   No results for input(s): LIPASE, AMYLASE in the last 168 hours. No results for input(s): AMMONIA in the last 168 hours. Coagulation Profile: No results for input(s): INR, PROTIME in the last 168 hours. Cardiac Enzymes: No results for input(s): CKTOTAL, CKMB, CKMBINDEX, TROPONINI in the last 168 hours. BNP (last 3 results) No results for input(s): PROBNP in the last 8760 hours. HbA1C: No  results for input(s): HGBA1C in the last 72 hours. CBG:  Recent Labs Lab 01/12/17 1551  GLUCAP 175*   Lipid Profile: No results for input(s): CHOL, HDL, LDLCALC, TRIG, CHOLHDL, LDLDIRECT in the last 72 hours. Thyroid Function Tests: No results for input(s): TSH, T4TOTAL, FREET4, T3FREE, THYROIDAB in the last 72 hours. Anemia Panel: No results for input(s): VITAMINB12, FOLATE, FERRITIN, TIBC, IRON, RETICCTPCT in the last 72 hours. Sepsis Labs: No results for input(s): PROCALCITON, LATICACIDVEN in the last 168 hours.  Recent Results (from the past 240 hour(s))  Blood Culture (routine x 2)     Status: None   Collection Time: 01/06/17  8:34 AM  Result Value Ref Range Status   Specimen Description BLOOD RIGHT ANTECUBITAL  Final   Special Requests   Final    BOTTLES DRAWN AEROBIC AND ANAEROBIC Blood Culture adequate volume   Culture NO GROWTH 5 DAYS  Final   Report Status 01/11/2017 FINAL  Final  Blood Culture (routine x 2)     Status: None  Collection Time: 01/06/17  8:44 AM  Result Value Ref Range Status   Specimen Description BLOOD LEFT HAND  Final   Special Requests   Final    BOTTLES DRAWN AEROBIC ONLY Blood Culture adequate volume   Culture NO GROWTH 5 DAYS  Final   Report Status 01/11/2017 FINAL  Final  Urine culture     Status: None   Collection Time: 01/06/17 11:18 AM  Result Value Ref Range Status   Specimen Description URINE, RANDOM  Final   Special Requests NONE  Final   Culture NO GROWTH  Final   Report Status 01/07/2017 FINAL  Final  Culture, blood (routine x 2)     Status: None   Collection Time: 01/06/17 11:00 PM  Result Value Ref Range Status   Specimen Description BLOOD RIGHT WRIST  Final   Special Requests   Final    BOTTLES DRAWN AEROBIC AND ANAEROBIC Blood Culture adequate volume   Culture NO GROWTH 5 DAYS  Final   Report Status 01/12/2017 FINAL  Final  Culture, blood (routine x 2)     Status: None   Collection Time: 01/06/17 11:08 PM  Result Value  Ref Range Status   Specimen Description BLOOD RIGHT HAND  Final   Special Requests IN PEDIATRIC BOTTLE Blood Culture adequate volume  Final   Culture NO GROWTH 5 DAYS  Final   Report Status 01/12/2017 FINAL  Final  MRSA PCR Screening     Status: None   Collection Time: 01/07/17  2:45 AM  Result Value Ref Range Status   MRSA by PCR NEGATIVE NEGATIVE Final    Comment:        The GeneXpert MRSA Assay (FDA approved for NASAL specimens only), is one component of a comprehensive MRSA colonization surveillance program. It is not intended to diagnose MRSA infection nor to guide or monitor treatment for MRSA infections.          Radiology Studies: No results found.      Scheduled Meds: . allopurinol  100 mg Oral QHS  . aspirin  81 mg Oral Daily  . chlorhexidine  15 mL Mouth Rinse BID  . clindamycin  1 application Topical Daily  . memantine  28 mg Oral QHS   And  . donepezil  10 mg Oral QHS  . enoxaparin (LOVENOX) injection  30 mg Subcutaneous Q24H  . fluticasone  1 spray Each Nare Daily  . levothyroxine  25 mcg Oral QAC breakfast  . mouth rinse  15 mL Mouth Rinse q12n4p  . Melatonin  3 mg Oral QHS  . midodrine  2.5 mg Oral BID WC  . mineral oil-hydrophilic petrolatum  1 application Topical BID  . pantoprazole  40 mg Oral Q1200  . pravastatin  20 mg Oral QHS  . tamsulosin  0.4 mg Oral Daily   Continuous Infusions: . piperacillin-tazobactam (ZOSYN)  IV Stopped (01/14/17 0930)     LOS: 8 days    Time spent: 35 mins     Varina Hulon A, MD [facility] Pager [phone]  If 7PM-7AM, please contact night-coverage [email or web address] Password [facility] 01/14/2017, 12:19 PM

## 2017-01-15 DIAGNOSIS — Z515 Encounter for palliative care: Secondary | ICD-10-CM

## 2017-01-15 NOTE — Progress Notes (Addendum)
Daily Progress Note   Patient Name: [name] [name]       Date: 01/15/2017 DOB: 1951-05-29  Age: 66 y.o. MRN#: [id number] Attending Physician: Clydia LlanoElmahi, Mutaz, MD Primary Care Physician: Lucretia Fieldoyals, Hoover M Admit Date: 01/06/2017  Reason for Consultation/Follow-up: Disposition and Establishing goals of care  Subjective: [name] [name] appears similar to when I saw him at the end of last week. He was able to say a few social words ("good morning" and "thanks"), but is otherwise unable to communicate his needs and nonverbal. Per his nursing assistant, he ate the majority of his breakfast. He is coughing more effectively today, with improved clearance of secretions from his airway.   Length of Stay: 9  Current Medications: Scheduled Meds:  . allopurinol  100 mg Oral QHS  . aspirin  81 mg Oral Daily  . chlorhexidine  15 mL Mouth Rinse BID  . clindamycin  1 application Topical Daily  . memantine  28 mg Oral QHS   And  . donepezil  10 mg Oral QHS  . enoxaparin (LOVENOX) injection  30 mg Subcutaneous Q24H  . fluticasone  1 spray Each Nare Daily  . levothyroxine  25 mcg Oral QAC breakfast  . mouth rinse  15 mL Mouth Rinse q12n4p  . Melatonin  3 mg Oral QHS  . midodrine  2.5 mg Oral BID WC  . mineral oil-hydrophilic petrolatum  1 application Topical BID  . pantoprazole  40 mg Oral Q1200  . pravastatin  20 mg Oral QHS  . tamsulosin  0.4 mg Oral Daily    Continuous Infusions: . piperacillin-tazobactam (ZOSYN)  IV Stopped (01/15/17 0900)    PRN Meds: acetaminophen, albuterol, guaiFENesin, hydrocortisone cream, lactulose, morphine CONCENTRATE, RESOURCE THICKENUP CLEAR  Physical Exam         Constitutional: He appears cachectic. He has a sickly appearance.  Frail man appears older than age  HENT:  Head: Normocephalic and atraumatic.  Mouth/Throat:  Oropharynx is clear and moist. Abnormal dentition. No oropharyngeal exudate.  Eyes: EOM are normal.  Neck: Normal range of motion. Neck supple.  Cardiovascular: Normal rate and regular rhythm.   Pulmonary/Chest: No accessory muscle usage. Regular rate. He has rhonchi.  Wet cough, able to clear secretions with cough. Abdominal: Soft. Normal appearance and bowel sounds are normal.  Musculoskeletal: He exhibits edema (BUE).  Contracted and cries out when moved  Neurological: He is alert.  Predominantly non-verbal. Does track me in room. Does not follow simple commands. Skin: Skin is warm and dry. There is pallor.  Psychiatric:  Calm in bed.    Vital Signs: BP (!) 114/91   Pulse 62   Temp 98.6 F (37 C) (Oral)   Resp 17   Ht 5' (1.524 m)   Wt 49.2 kg (108 lb 7.5 oz)   SpO2 93%   BMI 21.18 kg/m  SpO2: SpO2: 93 % O2 Device: O2 Device: Not Delivered O2 Flow Rate: O2 Flow Rate (L/min): 1 L/min  Intake/output summary:  Intake/Output Summary (Last 24 hours) at 01/15/17 1242 Last data filed at 01/15/17 0900  Gross per 24 hour  Intake              510 ml  Output  600 ml  Net              -90 ml   LBM: Last BM Date: 01/15/17 Baseline Weight: Weight: 49.2 kg (108 lb 7.5 oz) Most recent weight: Weight: 49.2 kg (108 lb 7.5 oz)  Palliative Assessment/Data: PPS 30%   Flowsheet Rows     Most Recent Value  Intake Tab  Referral Department  Hospitalist  Unit at Time of Referral  Intermediate Care Unit  Palliative Care Primary Diagnosis  Sepsis/Infectious Disease  Date Notified  01/10/17  Palliative Care Type  New Palliative care  Reason for referral  Clarify Goals of Care  Date of Admission  01/06/17  Date first seen by Palliative Care  01/10/17  # of days Palliative referral response time  0 Day(s)  # of days IP prior to Palliative referral  4  Clinical Assessment  Psychosocial & Spiritual Assessment  Palliative Care Outcomes      Patient Active Problem List    Diagnosis Date Noted  . Functional quadriplegia (HCC)   . Severe comorbid illness   . Goals of care, counseling/discussion   . Palliative care by specialist   . Malnutrition of moderate degree 01/09/2017  . Sacral decubitus ulcer, stage III (HCC) 01/07/2017  . GERD (gastroesophageal reflux disease) 01/06/2017  . Acute renal failure superimposed on stage 3 chronic kidney disease (HCC) 01/06/2017  . Aspiration pneumonia (HCC) 01/06/2017  . Acute metabolic encephalopathy 10/24/2016  . E coli bacteremia   . Acute cystitis without hematuria   . UTI (urinary tract infection) 10/23/2016  . Urinary tract infection 10/22/2016  . CKD (chronic kidney disease) stage 3, GFR 30-59 ml/min 10/22/2016  . Anemia 10/22/2016  . Hypotension   . Diabetes mellitus with complication (HCC)   . Sepsis (HCC)   . Chest pain 12/29/2014  . Pure hypercholesterolemia 05/14/2013  . DOWNS SYNDROME 09/14/2010  . ABDOMINAL PAIN, EPIGASTRIC 09/14/2010  . Type 2 diabetes mellitus, controlled (HCC) 10/17/2006    Palliative Care Assessment & Plan   HPI: 66 y.o. male  with past medical history of Down Syndrome (nonverbal), HTN, GERD, hypothyroidism, anemia of chronic disease, and CKD stage 3. He presented to the ED with worsening mental status, fever, and a cough. He was admitted on 01/06/2017. He was found to have aspiration pneumonia. Speech evaluated and MBS showed primary oral dysphagia with moderate aspiration risk. In chart review he has clear evidence of failure to thrive with Albumin 1.4 and significant weight loss (150lb 2 years ago, 130lb 10/2016, and now 108lb this admission). In light of his failure to thrive, dysphagia, and risk for recurrent aspiration, Palliative was consulted to assist in clarifying goals of care and codes status. Of note, he has a state appointed legal guardian: [name] [name] [phone]).  Assessment: I had a long discussion with [name] [name] on 5/24 during my initial consult. Please see my  note for full details. In brief, based on his clinical presentation and history I recommended consideration of DNR status and transition to Hospice at his group home after discharge. In accordance with DHHS requirements, these recommendations were formally made by the Palliative MD ([name] [name]) and the primary team ([name] [name]) and the requisite notaraized paperwork was faxed back to [facility] on 5/25. She was planning to discuss the recommendation with her supervisor, and reach out to Mr. Meece's brother.  I was able to speak with [name] [name] today. She does agree with getting hospice set up at Mr. Goshert's group home. She asked that I  put that referral in. She is meeting today with her team to discuss code status, but will likely not have a decision until tomorrow afternoon. I explained that code status could be changed after discharge (with Hospice helping to facilitate), and he would likely be discharged back to his group home once Hospice was arranged. She felt this was reasonable.  Recommendations/Plan:  Full code, [name] [name] will be discussing code status today and will have a decision on transition to DNR by tomorrow afternoon  SW consulted for hospice referral at group home (NOT HOSPICE HOUSE); once this is set-up he does not need to remain at the hospital for code status decision  ADDENDUM: I received a call back from [place] and she informed me that his group home would not honor DNR or allow utilization of Hospice services (this changed from when we spoke last week). He will need a new FL2 and skilled care, to have Hospice at a skilled facility. MD and SW notified.   Code Status:  Full code  Prognosis:   < 6 months  Discharge Planning:  Back to group home with hospice services  Care plan was discussed with pt's legal guardian and primary team  Thank you for allowing the Palliative Medicine Team to assist in the care of this patient.  Total time: 35 minutes    Greater than 50%  of this  time was spent counseling and coordinating care related to the above assessment and plan.  Murrell Converse, NP Palliative Medicine Team [phone] pager (7a-5p) Team Phone # [phone]

## 2017-01-15 NOTE — Care Management Important Message (Signed)
Important Message  Patient Details  Name: [name] LiasJohn W [name] MRN: [id number] Date of Birth: [birth year]   Medicare Important Message Given:  Yes    Kyla BalzarineShealy, Bryana Froemming Abena 01/15/2017, 1:19 PM

## 2017-01-15 NOTE — Progress Notes (Signed)
CSW following for SNF with hospice.  Referrals sent out- awaiting return call from pt guardian to discuss choices  Patient PASAR also pending at this time- cannot DC to SNF without PASAR approved  CSW will continue to follow  Burna SisJenna H. Retina Bernardy, LCSW Clinical Social Worker [phone]

## 2017-01-15 NOTE — Progress Notes (Addendum)
PROGRESS NOTE    [name] [name]  ZOX:[id number] DOB: June 07, 1951 DOA: 01/06/2017 PCP: Lucretia Field   Subjective: No changes clinically, nonconversant. Per palliative care he will go to SNF with hospice.  Barriers discharge: Awaiting SNF bed, follow with CSW.  Brief Narrative:   66 y.o.malewith history of hypertension, GERD, hypothyroidism, gout, Down syndrome, nonverbal, anemia, CKD stage III, who presented with worsening mental status and cough.  Patient was hospitalized 05/3 > 5/8 due to sepsis 2/2UTI and proctitis. Pt had blood culture with no growth andurine culture positive forpansentitive Ecoli. Pt was discharged on Augmentin.   Assessment & Plan:   Principal Problem:   Sepsis (HCC) Active Problems:   DOWNS SYNDROME   Pure hypercholesterolemia   Anemia   Acute metabolic encephalopathy   GERD (gastroesophageal reflux disease)   Acute renal failure superimposed on stage 3 chronic kidney disease (HCC)   Aspiration pneumonia (HCC)   Sacral decubitus ulcer, stage III (HCC)   Malnutrition of moderate degree   Severe comorbid illness   Goals of care, counseling/discussion   Palliative care by specialist   Functional quadriplegia (HCC)   Acute metabolic encephalopathy likely secondary to underlying infection, aspiration pneumonia, improved -Cultures have been negative at this time -Aspiration precautions. -Received 9 days of Zosyn, I will schedule for antibiotics to be done by tomorrow to complete 10 days. -Appears to be improved, sats lower to mid 90s on room air.  Failure to thrive/malnutrition of moderate degree -Patient albumin is 1.4, now he is bleeding and developed dysphagia. -Not sure if he will be able to consume the expected amount nutrients that should support him. -Dysphagia, dementia and Down syndrome and other comorbid conditions. -Patient will be discharged to SNF when bed available with hospice  Dysphagia -Per SLP on dysphagia 1 diet with nectar  thick liquids, needs assistance with meals  Anemia, acute on chronic  -Hemoglobin was 12.8, but likely this was lab error, hemoglobin around 9.5 -Hemoglobin dropped further to 6.0. Check stools for occult blood. -CT scan done on 12/20/16 showed proctitis. I appreciate GI evaluation. -No aggressive interventions, patient is going to be discharged to hospice  Stage III sacral ulcer -Wound care per nursing staff, pressure injury -Per WC nurse documentation: Dark purple deep tissue injuries, Sacrum 4X4cm; Left buttock 4X2cm   Hypernatremia -Likely due to dehydration, change IV fluid to D5W.  Hypoalbuminemia/moderate protein calorie malnutrition -Encourage by mouth diet  Leukocytosis? -He's currently already on IV antibiotics. Continue at this time and monitor his WBC  Down Syndrome with Mod Dementia  -apparently state Rep is his guardian. -Continue to treat for now, if not improving he will need roadmap for re-admission, likely to involve palliative.  Chronic Hypotension -Started on cortisol and compression stockings. Continue.  Hypokalemia - placed wth oral and IV supplements   DVT prophylaxis: Lovenox  Code Status: Full  Family Communication:  No family  Disposition Plan: SDU  Antimicrobials:   Vacn 5/20> 5/21  Cefepime 5/20 >5/21   Zosyn 5/21    Objective: Vitals:   01/15/17 0800 01/15/17 1147 01/15/17 1400 01/15/17 1600  BP: (!) 135/106 (!) 114/91 105/60 113/62  Pulse: 64 62 66 66  Resp: 15 17 (!) 23 16  Temp: 98.2 F (36.8 C) 98.6 F (37 C)    TempSrc: Oral Oral    SpO2: 100% 93% 92% 97%  Weight:      Height:        Intake/Output Summary (Last 24 hours) at 01/15/17 1618 Last data  filed at 01/15/17 1600  Gross per 24 hour  Intake              510 ml  Output              200 ml  Net              310 ml   Filed Weights   01/07/17 0246  Weight: 49.2 kg (108 lb 7.5 oz)    Examination:  General exam: Appears calm and comfortable. Difficult to  understand. Cachectic, frail  Respiratory system: Clear to auscultation. Respiratory effort normal. Cardiovascular system: S1 & S2 heard, RRR. No JVD, murmurs, rubs, gallops or clicks. No pedal edema. Gastrointestinal system: Abdomen is nondistended, soft and nontender. No organomegaly or masses felt. Normal bowel sounds heard. Central nervous system: Alert and oriented. No focal neurological deficits. Extremities: Symmetric 5 x 5 power. Skin: No rashes, lesions or ulcers Psychiatry: Judgement and insight appear normal. Mood & affect appropriate.     Data Reviewed:   CBC:  Recent Labs Lab 01/09/17 0347 01/10/17 0229 01/11/17 0403 01/12/17 0227  WBC 8.6 9.6 9.7 8.3  HGB 6.0* 9.3* 10.2* 11.0*  HCT 19.3* 29.2* 31.6* 33.8*  MCV 94.1 90.1 90.3 91.4  PLT 229 243 262 251   Basic Metabolic Panel:  Recent Labs Lab 01/09/17 0347 01/10/17 0229 01/11/17 0403 01/12/17 0227 01/14/17 0809  NA 150* 147* 144 144 143  K 3.0* 3.2* 3.6 4.2 4.6  CL 120* 113* 110 110 106  CO2 23 26 26 27 26   GLUCOSE 112* 197* 135* 166* 124*  BUN 15 13 11 12 17   CREATININE 1.15 1.20 1.05 1.07 1.07  CALCIUM 7.6* 8.0* 8.0* 8.1* 8.7*  MG 1.6* 1.6* 2.0  --   --    GFR: Estimated Creatinine Clearance: 49.2 mL/min (by C-G formula based on SCr of 1.07 mg/dL). Liver Function Tests: No results for input(s): AST, ALT, ALKPHOS, BILITOT, PROT, ALBUMIN in the last 168 hours. No results for input(s): LIPASE, AMYLASE in the last 168 hours. No results for input(s): AMMONIA in the last 168 hours. Coagulation Profile: No results for input(s): INR, PROTIME in the last 168 hours. Cardiac Enzymes: No results for input(s): CKTOTAL, CKMB, CKMBINDEX, TROPONINI in the last 168 hours. BNP (last 3 results) No results for input(s): PROBNP in the last 8760 hours. HbA1C: No results for input(s): HGBA1C in the last 72 hours. CBG:  Recent Labs Lab 01/12/17 1551  GLUCAP 175*   Lipid Profile: No results for input(s):  CHOL, HDL, LDLCALC, TRIG, CHOLHDL, LDLDIRECT in the last 72 hours. Thyroid Function Tests: No results for input(s): TSH, T4TOTAL, FREET4, T3FREE, THYROIDAB in the last 72 hours. Anemia Panel: No results for input(s): VITAMINB12, FOLATE, FERRITIN, TIBC, IRON, RETICCTPCT in the last 72 hours. Sepsis Labs: No results for input(s): PROCALCITON, LATICACIDVEN in the last 168 hours.  Recent Results (from the past 240 hour(s))  Blood Culture (routine x 2)     Status: None   Collection Time: 01/06/17  8:34 AM  Result Value Ref Range Status   Specimen Description BLOOD RIGHT ANTECUBITAL  Final   Special Requests   Final    BOTTLES DRAWN AEROBIC AND ANAEROBIC Blood Culture adequate volume   Culture NO GROWTH 5 DAYS  Final   Report Status 01/11/2017 FINAL  Final  Blood Culture (routine x 2)     Status: None   Collection Time: 01/06/17  8:44 AM  Result Value Ref Range Status   Specimen Description  BLOOD LEFT HAND  Final   Special Requests   Final    BOTTLES DRAWN AEROBIC ONLY Blood Culture adequate volume   Culture NO GROWTH 5 DAYS  Final   Report Status 01/11/2017 FINAL  Final  Urine culture     Status: None   Collection Time: 01/06/17 11:18 AM  Result Value Ref Range Status   Specimen Description URINE, RANDOM  Final   Special Requests NONE  Final   Culture NO GROWTH  Final   Report Status 01/07/2017 FINAL  Final  Culture, blood (routine x 2)     Status: None   Collection Time: 01/06/17 11:00 PM  Result Value Ref Range Status   Specimen Description BLOOD RIGHT WRIST  Final   Special Requests   Final    BOTTLES DRAWN AEROBIC AND ANAEROBIC Blood Culture adequate volume   Culture NO GROWTH 5 DAYS  Final   Report Status 01/12/2017 FINAL  Final  Culture, blood (routine x 2)     Status: None   Collection Time: 01/06/17 11:08 PM  Result Value Ref Range Status   Specimen Description BLOOD RIGHT HAND  Final   Special Requests IN PEDIATRIC BOTTLE Blood Culture adequate volume  Final   Culture  NO GROWTH 5 DAYS  Final   Report Status 01/12/2017 FINAL  Final  MRSA PCR Screening     Status: None   Collection Time: 01/07/17  2:45 AM  Result Value Ref Range Status   MRSA by PCR NEGATIVE NEGATIVE Final    Comment:        The GeneXpert MRSA Assay (FDA approved for NASAL specimens only), is one component of a comprehensive MRSA colonization surveillance program. It is not intended to diagnose MRSA infection nor to guide or monitor treatment for MRSA infections.          Radiology Studies: No results found.      Scheduled Meds: . allopurinol  100 mg Oral QHS  . aspirin  81 mg Oral Daily  . chlorhexidine  15 mL Mouth Rinse BID  . clindamycin  1 application Topical Daily  . memantine  28 mg Oral QHS   And  . donepezil  10 mg Oral QHS  . enoxaparin (LOVENOX) injection  30 mg Subcutaneous Q24H  . fluticasone  1 spray Each Nare Daily  . levothyroxine  25 mcg Oral QAC breakfast  . mouth rinse  15 mL Mouth Rinse q12n4p  . Melatonin  3 mg Oral QHS  . midodrine  2.5 mg Oral BID WC  . mineral oil-hydrophilic petrolatum  1 application Topical BID  . pantoprazole  40 mg Oral Q1200  . pravastatin  20 mg Oral QHS  . tamsulosin  0.4 mg Oral Daily   Continuous Infusions: . piperacillin-tazobactam (ZOSYN)  IV 3.375 g (01/15/17 1500)     LOS: 9 days    Time spent: 35 mins     Dare Spillman A, MD [facility] Pager [phone]  If 7PM-7AM, please contact night-coverage [email or web address] Password [email or web address] 01/15/2017, 4:18 PM

## 2017-01-15 NOTE — NC FL2 (Signed)
[place] MEDICAID FL2 LEVEL OF CARE SCREENING TOOL     IDENTIFICATION  Patient Name: [name] [name] Birthdate: June 14, 1951 Sex: male Admission Date (Current Location): 01/06/2017  [facility] [place] [place]   Facility and Address:  [facility], [address], [place], [place] [address]      Provider Number: [id number]  Attending Physician Name and Address:  Clydia LlanoElmahi, Mutaz, MD  Relative Name and Phone Number:       Current Level of Care: Hospital Recommended Level of Care: Skilled Nursing Facility Prior Approval Number:    Date Approved/Denied:   PASRR Number:    Discharge Plan: SNF    Current Diagnoses: Patient Active Problem List   Diagnosis Date Noted  . Functional quadriplegia (HCC)   . Severe comorbid illness   . Goals of care, counseling/discussion   . Palliative care by specialist   . Malnutrition of moderate degree 01/09/2017  . Sacral decubitus ulcer, stage III (HCC) 01/07/2017  . GERD (gastroesophageal reflux disease) 01/06/2017  . Acute renal failure superimposed on stage 3 chronic kidney disease (HCC) 01/06/2017  . Aspiration pneumonia (HCC) 01/06/2017  . Acute metabolic encephalopathy 10/24/2016  . E coli bacteremia   . Acute cystitis without hematuria   . UTI (urinary tract infection) 10/23/2016  . Urinary tract infection 10/22/2016  . CKD (chronic kidney disease) stage 3, GFR 30-59 ml/min 10/22/2016  . Anemia 10/22/2016  . Hypotension   . Diabetes mellitus with complication (HCC)   . Sepsis (HCC)   . Chest pain 12/29/2014  . Pure hypercholesterolemia 05/14/2013  . DOWNS SYNDROME 09/14/2010  . ABDOMINAL PAIN, EPIGASTRIC 09/14/2010  . Type 2 diabetes mellitus, controlled (HCC) 10/17/2006    Orientation RESPIRATION BLADDER Height & Weight     Self  Normal Incontinent, External catheter Weight: 108 lb 7.5 oz (49.2 kg) Height:  5' (152.4 cm)  BEHAVIORAL SYMPTOMS/MOOD NEUROLOGICAL BOWEL NUTRITION STATUS   Incontinent Diet (pureed, thickened liquid)  AMBULATORY STATUS COMMUNICATION OF NEEDS Skin   Extensive Assist Verbally Other (Comment) (DTI on sacrum and buttocks requiring foam dressing change)                       Personal Care Assistance Level of Assistance  Bathing, Feeding, Dressing Bathing Assistance: Maximum assistance Feeding assistance: Maximum assistance Dressing Assistance: Maximum assistance     Functional Limitations Info             SPECIAL CARE FACTORS FREQUENCY                       Contractures      Additional Factors Info  Code Status, Allergies, Isolation Precautions, Psychotropic Code Status Info: FULL Allergies Info: NKA Psychotropic Info: aricept   Isolation Precautions Info: MRSA     Current Medications (01/15/2017):  This is the current hospital active medication list Current Facility-Administered Medications  Medication Dose Route Frequency Provider Last Rate Last Dose  . acetaminophen (TYLENOL) tablet 650 mg  650 mg Oral Q6H PRN Lorretta HarpNiu, Xilin, MD   650 mg at 01/14/17 2130  . albuterol (PROVENTIL) (2.5 MG/3ML) 0.083% nebulizer solution 2.5 mg  2.5 mg Nebulization Q2H PRN Lonia BloodMcClung, Jeffrey T, MD      . allopurinol (ZYLOPRIM) tablet 100 mg  100 mg Oral QHS Lorretta HarpNiu, Xilin, MD   100 mg at 01/14/17 2058  . aspirin chewable tablet 81 mg  81 mg Oral Daily Lorretta HarpNiu, Xilin, MD   81 mg  at 01/15/17 1100  . chlorhexidine (PERIDEX) 0.12 % solution 15 mL  15 mL Mouth Rinse BID Clydia Llano, MD   15 mL at 01/15/17 0850  . clindamycin (CLINDAGEL) 1 % gel 1 application  1 application Topical Daily Lorretta Harp, MD   1 application at 01/15/17 1100  . memantine (NAMENDA XR) 24 hr capsule 28 mg  28 mg Oral QHS Lorretta Harp, MD   28 mg at 01/14/17 2158   And  . donepezil (ARICEPT) tablet 10 mg  10 mg Oral Marice Potter, MD   10 mg at 01/14/17 2058  . enoxaparin (LOVENOX) injection 30 mg  30 mg Subcutaneous Q24H Lorretta Harp, MD   30 mg at 01/15/17 1111  . fluticasone  (FLONASE) 50 MCG/ACT nasal spray 1 spray  1 spray Each Nare Daily Lorretta Harp, MD   1 spray at 01/15/17 1059  . guaiFENesin (ROBITUSSIN) 100 MG/5ML solution 100 mg  5 mL Oral Q4H PRN Clydia Llano, MD   100 mg at 01/11/17 2226  . hydrocortisone cream 1 % 1 application  1 application Topical QID PRN Lorretta Harp, MD      . lactulose (CHRONULAC) 10 GM/15ML solution 10 g  10 g Oral Daily PRN Lorretta Harp, MD      . levothyroxine (SYNTHROID, LEVOTHROID) tablet 25 mcg  25 mcg Oral QAC breakfast Lorretta Harp, MD   25 mcg at 01/15/17 [phone]  . MEDLINE mouth rinse  15 mL Mouth Rinse q12n4p Clydia Llano, MD   15 mL at 01/14/17 1700  . Melatonin TABS 3 mg  3 mg Oral QHS Lorretta Harp, MD   3 mg at 01/14/17 2058  . midodrine (PROAMATINE) tablet 2.5 mg  2.5 mg Oral BID WC Amin, Ankit Chirag, MD   2.5 mg at 01/15/17 0835  . mineral oil-hydrophilic petrolatum (AQUAPHOR) ointment 1 application  1 application Topical BID Lorretta Harp, MD   1 application at 01/15/17 1100  . morphine CONCENTRATE 10 MG/0.5ML oral solution 5 mg  5 mg Sublingual Q3H PRN Ranae Palms, NP      . pantoprazole (PROTONIX) EC tablet 40 mg  40 mg Oral Q1200 Lorretta Harp, MD   40 mg at 01/15/17 1111  . piperacillin-tazobactam (ZOSYN) IVPB 3.375 g  3.375 g Intravenous Q8H Juliette Mangle, RPH   Stopped at 01/15/17 0900  . pravastatin (PRAVACHOL) tablet 20 mg  20 mg Oral QHS Lorretta Harp, MD   20 mg at 01/14/17 2058  . RESOURCE THICKENUP CLEAR   Oral PRN Lonia Blood, MD      . tamsulosin [facility]) capsule 0.4 mg  0.4 mg Oral Daily Lorretta Harp, MD   0.4 mg at 01/15/17 1100     Discharge Medications: Please see discharge summary for a list of discharge medications.  Relevant Imaging Results:  Relevant Lab Results:   Additional Information SS#: [id number]  Burna Sis, LCSW

## 2017-01-16 MED ORDER — MORPHINE SULFATE (CONCENTRATE) 10 MG/0.5ML PO SOLN: 5.0000 mg | ORAL | 0 refills | Status: AC | PRN

## 2017-01-16 MED ORDER — TAMSULOSIN HCL 0.4 MG PO CAPS: 0.4000 mg | ORAL_CAPSULE | Freq: Every day | ORAL | Status: AC

## 2017-01-16 MED ORDER — MIDODRINE HCL 2.5 MG PO TABS: 2.5000 mg | ORAL_TABLET | Freq: Two times a day (BID) | ORAL | Status: AC

## 2017-01-16 MED ORDER — RESOURCE THICKENUP CLEAR PO POWD: ORAL | Status: AC

## 2017-01-16 MED ORDER — PANTOPRAZOLE SODIUM 40 MG PO TBEC: 40.0000 mg | DELAYED_RELEASE_TABLET | Freq: Every day | ORAL | Status: AC

## 2017-01-16 NOTE — Discharge Instructions (Signed)
Hospice °Introduction °Hospice is a service that is designed to provide people who are terminally ill and their families with medical, spiritual, and psychological support. Its aim is to improve your quality of life by keeping you as alert and comfortable as possible. °Who will be my providers when I begin hospice care? °Hospice teams often include: °· A nurse. °· A doctor. The hospice doctor will be available for your care, but you can bring your regular doctor or nurse practitioner. °· Social workers. °· Religious leaders (such as a chaplain). °· Trained volunteers. °What roles will providers play in my care? °Hospice is performed by a team of health care professionals and volunteers who: °· Help keep you comfortable: °¨ Hospice can be provided in your home or in a homelike setting. °¨ The hospice staff works with your family and friends to help meet your needs. °¨ You will enjoy the support of loved ones by receiving much of your basic care from family and friends. °· Provide pain relief and manage your symptoms. The staff supply all necessary medicines and equipment. °· Provide companionship when you are alone. °· Allow you and your family to rest. They may do light housekeeping, prepare meals, and run errands. °· Provide counseling. They will make sure your emotional, spiritual, and social needs and those of your family are being met. °· Provide spiritual care: °¨ Spiritual care will be individualized to meet your needs and your family's needs. °¨ Spiritual care may involve: °§ Helping you look at what death means to you. °§ Helping you say goodbye to your family and friends. °§ Performing a specific religious ceremony or ritual. °When should hospice care begin? °Most people who use hospice are believed to have fewer than 6 months to live. °· Your family and health care providers can help you decide when hospice services should begin. °· If your condition improves, you may discontinue the program. °What should  I consider before selecting a program? °Most hospice programs are run by nonprofit, independent organizations. Some are affiliated with hospitals, nursing homes, or home health care agencies. Hospice programs can take place in the home or at a hospice center, hospital, or skilled nursing facility. When choosing a hospice program, ask the following questions: °· What services are available to me? °· What services will be offered to my loved ones? °· How involved will my loved ones be? °· How involved will my health care provider be? °· Who makes up the hospice care team? How are they trained or screened? °· How will my pain and symptoms be managed? °· If my circumstances change, can the services be provided in a different setting, such as my home or in the hospital? °· Is the program reviewed and licensed by the state or certified in some other way? °Where can I learn more about hospice? °You can learn about existing hospice programs in your area from your health care providers. You can also read more about hospice online. The websites of the following organizations contain helpful information: °· The National Hospice and Palliative Care Organization (NHPCO). °· The Hospice Association of America (HAA). °· The Hospice Education Institute. °· The American Cancer Society (ACS). °· Hospice Net. °This information is not intended to replace advice given to you by your health care provider. Make sure you discuss any questions you have with your health care provider. °Document Released: 11/23/2003 Document Revised: 03/22/2016 Document Reviewed: 06/16/2013 °Elsevier Interactive Patient Education © 2017 Elsevier Inc. ° °

## 2017-01-16 NOTE — Progress Notes (Signed)
Patient discharged to [facility] via [facility], no distress, all belongings returned, AVS and RX sent with patient.

## 2017-01-16 NOTE — Progress Notes (Addendum)
CSW has sent in requested documents to [facility]- awaiting determination.  CSW spoke with DSS guardian this morning to discuss bed offers/DNR status. DSS guardian [facility]- message left for admissions to confirm  Burna SisJenna H. Leonie Amacher, LCSW Clinical Social Worker [phone]

## 2017-01-16 NOTE — Progress Notes (Signed)
Patient transferred to 3M03, VSS.

## 2017-01-16 NOTE — Discharge Summary (Signed)
[facility]  Physician Discharge Summary   Patient ID: CURREN MOHRMANN MRN: [id number] DOB/AGE: 09/24/1950 66 y.o.  Admit date: 01/06/2017 Discharge date: 01/16/2017  PCP: Lucretia Field  DISCHARGE DIAGNOSES:  Principal Problem:   Sepsis (HCC) Active Problems:   DOWNS SYNDROME   Pure hypercholesterolemia   Anemia   Acute metabolic encephalopathy   GERD (gastroesophageal reflux disease)   Acute renal failure superimposed on stage 3 chronic kidney disease (HCC)   Aspiration pneumonia (HCC)   Sacral decubitus ulcer, stage III (HCC)   Malnutrition of moderate degree   Severe comorbid illness   Goals of care, counseling/discussion   Palliative care by specialist   Functional quadriplegia (HCC)   RECOMMENDATIONS FOR OUTPATIENT FOLLOW UP: 1. Hospice to follow at skilled nursing facility 2. CODE STATUS to be addressed by patient's guardian, which is the [place], and hospice   DISCHARGE CONDITION: poor  Diet recommendation: Dysphagia 1 diet with nectar thick liquids  Filed Weights   01/07/17 0246  Weight: 49.2 kg (108 lb 7.5 oz)    INITIAL HISTORY: 66 y.o.malewith history of hypertension, GERD, hypothyroidism, gout, Down syndrome, nonverbal, anemia, CKD stage III, who presentedwith worsening mental status and cough. Patient was hospitalized 05/3 >5/8 due to sepsis 2/2UTI and proctitis. Pt had blood culture with no growth andurine culture positive forpansentitive Ecoli. Pt was discharged on Augmentin. He was hospitalized for further management.  Consultations:  Palliative medicine   HOSPITAL COURSE:   Acute metabolic encephalopathy likely secondary to underlying infection, aspiration pneumonia, improved The patient was hospitalized. He was placed on Zosyn. He will complete a course of 10 days. Cultures have been negative so far. Continue to use aspiration precautions. He will be on a dysphagia 1 diet.  Failure to thrive/malnutrition of moderate  degree Patient with multiple medical issues including dysphagia, dementia, Down syndrome. Poor oral intake. Poor prognosis. Seen by palliative medicine. He is appropriate for hospice.  Dysphagia Per SLP on dysphagia 1 diet with nectar thick liquids, needs assistance with meals  Anemia, acute on chronic  Patient did have a significant drop in hemoglobin. CT scan showed proctitis. GI was consulted as well. Patient was transfused. Hasn't had any active bleeding. No aggressive interventions. Hospice services.  Stage III sacral ulcer Per WC nurse documentation: Dark purple deep tissue injuries, Sacrum 4X4cm; Left buttock 4X2cm   Hypernatremia Likely due to dehydration.  Hypoalbuminemia/moderate protein calorie malnutrition Encourage by mouth diet  Down Syndrome with Mod Dementia  Slowly declining. State Rep is his guardian. Palliative medicine was consulted. He is appropriate for hospice services. To be discharged to skilled nursing facility later today.  Chronic Hypotension Started on midodrine and compression stockings. Continue.  Patient's prognosis is poor. Okay for discharge to skilled nursing facility with hospice. CODE STATUS to be addressed by patient's State guardian and hospice.    PERTINENT LABS:  The results of significant diagnostics from this hospitalization (including imaging, microbiology, ancillary and laboratory) are listed below for reference.    Microbiology: Recent Results (from the past 240 hour(s))  Culture, blood (routine x 2)     Status: None   Collection Time: 01/06/17 11:00 PM  Result Value Ref Range Status   Specimen Description BLOOD RIGHT WRIST  Final   Special Requests   Final    BOTTLES DRAWN AEROBIC AND ANAEROBIC Blood Culture adequate volume   Culture NO GROWTH 5 DAYS  Final   Report Status 01/12/2017 FINAL  Final  Culture, blood (routine x 2)  Status: None   Collection Time: 01/06/17 11:08 PM  Result Value Ref Range Status    Specimen Description BLOOD RIGHT HAND  Final   Special Requests IN PEDIATRIC BOTTLE Blood Culture adequate volume  Final   Culture NO GROWTH 5 DAYS  Final   Report Status 01/12/2017 FINAL  Final  MRSA PCR Screening     Status: None   Collection Time: 01/07/17  2:45 AM  Result Value Ref Range Status   MRSA by PCR NEGATIVE NEGATIVE Final    Comment:        The GeneXpert MRSA Assay (FDA approved for NASAL specimens only), is one component of a comprehensive MRSA colonization surveillance program. It is not intended to diagnose MRSA infection nor to guide or monitor treatment for MRSA infections.      Labs: Basic Metabolic Panel:  Recent Labs Lab 01/10/17 0229 01/11/17 0403 01/12/17 0227 01/14/17 0809  NA 147* 144 144 143  K 3.2* 3.6 4.2 4.6  CL 113* 110 110 106  CO2 26 26 27 26   GLUCOSE 197* 135* 166* 124*  BUN 13 11 12 17   CREATININE 1.20 1.05 1.07 1.07  CALCIUM 8.0* 8.0* 8.1* 8.7*  MG 1.6* 2.0  --   --    CBC:  Recent Labs Lab 01/10/17 0229 01/11/17 0403 01/12/17 0227  WBC 9.6 9.7 8.3  HGB 9.3* 10.2* 11.0*  HCT 29.2* 31.6* 33.8*  MCV 90.1 90.3 91.4  PLT 243 262 251    CBG:  Recent Labs Lab 01/12/17 1551  GLUCAP 175*     IMAGING STUDIES Dg Abd 1 View  Result Date: 12/24/2016 CLINICAL DATA:  66 year old male with 3 mm distal left ureteral calculus on recent CT Abdomen and Pelvis. Hydronephrosis regressed on subsequent ultrasound. EXAM: ABDOMEN - 1 VIEW COMPARISON:  CT Abdomen and Pelvis 12/20/2016. Renal ultrasound 12/23/2016. KUB 01/01/2015. FINDINGS: Stable cholecystectomy clips. Non obstructed bowel gas pattern. Abdominal and pelvic visceral contours are stable. Dextroconvex lumbar scoliosis with multilevel advanced lumbar disc, endplate and posterior element degeneration. No acute osseous abnormality identified. The small distal left ureteral calculus is not identified in the pelvis, but probably would not be radiographically apparent. No urologic  calculus identified. IMPRESSION: 1. Negative KUB appearance of the abdomen. The 3 mm distal left ureteral calculus seen recently by CT would probably not be visible radiographically. 2. Dextroconvex lumbar scoliosis and lumbar spine degeneration. Electronically Signed   By: Odessa Fleming M.D.   On: 12/24/2016 11:18   Ct Chest Wo Contrast  Result Date: 12/24/2016 CLINICAL DATA:  Fever. EXAM: CT CHEST WITHOUT CONTRAST TECHNIQUE: Multidetector CT imaging of the chest was performed following the standard protocol without IV contrast. COMPARISON:  One-view chest x-ray 12/20/2016. FINDINGS: Cardiovascular: The heart size is normal. Coronary artery calcifications are present. Mediastinum/Nodes: No significant mediastinal or axillary adenopathy is present. Lungs/Pleura: Thickening of the interlobular septa and some ground-glass attenuation suggests edema. No significant airspace consolidation is present. Small bilateral pleural effusions are evident. Upper Abdomen: Cholecystectomy is noted. No focal lesions are present. Musculoskeletal: Endplate degenerative changes are most evident at T6-7 and T7-8. Vertebral body heights and AP alignment are anatomic. Leftward curvature is present in the mid thoracic spine. The ribs are unremarkable. IMPRESSION: 1. Interstitial edema and small effusions suggesting congestive heart failure. 2. No focal airspace disease. 3. Coronary artery disease. 4. Cholecystectomy. Electronically Signed   By: Marin Roberts M.D.   On: 12/24/2016 17:22   Ct Angio Chest Pe W/cm &/or Wo Cm  Result  Date: 01/06/2017 CLINICAL DATA:  Pt Hx of Down's, WC bound and minimal verbalization baseline. Today has SOB Also patients arms are contracted and cannot be straightened either above the head or to the side EXAM: CT ANGIOGRAPHY CHEST WITH CONTRAST TECHNIQUE: Multidetector CT imaging of the chest was performed using the standard protocol during bolus administration of intravenous contrast. Multiplanar CT  image reconstructions and MIPs were obtained to evaluate the vascular anatomy. CONTRAST:  Isovue 370 80ml COMPARISON:  12/24/2016 FINDINGS: Cardiovascular: Satisfactory opacification of pulmonary arteries noted, and there is no evidence of pulmonary emboli. Incomplete opacification of the thoracic aorta with no suggestion of aneurysm, dissection, or stenosis. Mediastinum/Nodes: No enlarged mediastinal, hilar, or axillary lymph nodes. Thyroid gland, trachea, and esophagus demonstrate no significant findings. No pericardial effusion. Lungs/Pleura: No pleural effusion. No pneumothorax. Secretions in central right bronchial tree. Lungs are clear. Motion degrades images through the lung bases. Upper Abdomen: Cholecystectomy clips.  No acute findings. Musculoskeletal: Anterior vertebral endplate spurring at multiple levels in the mid thoracic spine. Advanced degenerative changes in bilateral shoulders. No fracture or worrisome bone lesion. Review of the MIP images confirms the above findings. IMPRESSION: 1. Negative for acute PE. 2. Secretions in central airways on the right. No focal pulmonary parenchymal process. Electronically Signed   By: Corlis Leak M.D.   On: 01/06/2017 14:01   US Abdomen Complete  Result Date: 12/23/2016 CLINICAL DATA:  66 year old male with bilateral hydronephrosis. 3 mm left UVJ calculus. Right side obstructing etiology not evident on recent CT. EXAM: ABDOMEN ULTRASOUND COMPLETE COMPARISON:  CT Abdomen and Pelvis without contrast 12/20/2016 and earlier FINDINGS: Gallbladder: Surgically absent. Common bile duct: Diameter: 6 mm, within normal limits Liver: No focal lesion identified. Within normal limits in parenchymal echogenicity. IVC: No abnormality visualized. Pancreas: Visualized portion unremarkable. Spleen: Size and appearance within normal limits. Right Kidney: Length: 8.5 cm. No evidence of right hydronephrosis today, renal sinus fat is visible. Renal cortical volume loss noted. Left  Kidney: Length: 9.7 cm. Mild left hydronephrosis, mildly improved from recent CT. Less pronounced renal cortical volume loss. Abdominal aorta: No aneurysm visualized. Other findings: None. IMPRESSION: 1. Resolved right side and regressed left side hydronephrosis since 12/20/2016. 2. Otherwise negative ultrasound appearance of the abdomen. Electronically Signed   By: Odessa Fleming M.D.   On: 12/23/2016 15:13   Dg Chest Port 1 View  Result Date: 01/06/2017 CLINICAL DATA:  SOB. Pt in from [facility] group home via [facility] with "AMS" per facility. Pt was 72% RA on EMS arrival, rhonchi all fields and placed on NRB, sats came up to 94%. Hx of diabetes. EXAM: PORTABLE CHEST 1 VIEW COMPARISON:  12/20/2016 FINDINGS: Cardiac silhouette is normal in size. No mediastinal or hilar masses. No evidence of adenopathy. Prominent bronchovascular markings noted in the medial lung bases similar to prior studies consistent with chronic bronchitic change. Lungs otherwise clear. No pleural effusion or pneumothorax. Skeletal structures are demineralized but grossly intact. IMPRESSION: No active cardiopulmonary disease. Electronically Signed   By: Amie Portland M.D.   On: 01/06/2017 08:46   Dg Chest Port 1 View  Result Date: 12/20/2016 CLINICAL DATA:  Actively wheezing. EXAM: PORTABLE CHEST 1 VIEW COMPARISON:  10/22/2016 FINDINGS: 1011 hours. Low lung volumes. Cardiopericardial silhouette is at upper limits of normal for size. The lungs are clear wiithout focal pneumonia, edema, pneumothorax or pleural effusion. Bones are diffusely demineralized. Degenerative changes noted right shoulder. Telemetry leads overlie the chest. IMPRESSION: No active disease. Electronically Signed   By: Minerva Areola  Molli Posey M.D.   On: 12/20/2016 10:30   Dg Swallowing Func-speech Pathology  Result Date: 01/09/2017 Objective Swallowing Evaluation: Type of Study: MBS-Modified Barium Swallow Study Patient Details Name: EMMANUELL KANTZ MRN: [id number] Date of Birth: 1951-07-14  Today's Date: 01/09/2017 Time: SLP Start Time (ACUTE ONLY): 1023-SLP Stop Time (ACUTE ONLY): 1050 SLP Time Calculation (min) (ACUTE ONLY): 27 min Past Medical History: Past Medical History: Diagnosis Date . Diabetes mellitus without complication (HCC)  . Down syndrome  Past Surgical History: No past surgical history on file. HPI: PASCUAL MANTEL a 66 y.o.malewith a primary medical h/o mental retardation/dementia from a group home, baseline wheelchair bound, total care, is brought to [facility] ED due to more lethargic than normal. Diet at group home puree, thin liquids. ST to evalaute swallow function. Subjective: pt awake in bed Assessment / Plan / Recommendation CHL IP CLINICAL IMPRESSIONS 01/09/2017 Clinical Impression Pt demonstrates a primary oral dysphagia that impacts pharyngeal swallow. Oral function characterized by thrusting lingual movement with gradual transit of bolus with moderate oral residuals. Pt uanble to use straw and cup sips increased spillage and residuals; spoon feeding best method. Nectar and thin liquids spill to the pyriforms with eventual strong swallow trigger. 1-2 instances of aspiration occured with thin with sensation, but no expectoration of aspirate. Thins also spill to pharynx from oral cavity post swallow and threaten aspiration.  Nectar and purees tolerated very well. Recommend pt continue pureed diet and nectar thick liquids as he continues to recover from pneumonia. Pt may be able to upgrade to thins under careful supervision from SLP in  home health venue. Will monitor tolerance in acute setting.  SLP Visit Diagnosis Dysphagia, oropharyngeal phase (R13.12) Attention and concentration deficit following -- Frontal lobe and executive function deficit following -- Impact on safety and function Moderate aspiration risk   CHL IP TREATMENT RECOMMENDATION 01/09/2017 Treatment Recommendations Therapy as outlined in treatment plan below   Prognosis 01/07/2017 Prognosis for Safe Diet Advancement  Fair Barriers to Reach Goals -- Barriers/Prognosis Comment -- CHL IP DIET RECOMMENDATION 01/09/2017 SLP Diet Recommendations Nectar thick liquid;Dysphagia 1 (Puree) solids Liquid Administration via -- Medication Administration Crushed with puree Compensations -- Postural Changes --   CHL IP OTHER RECOMMENDATIONS 01/09/2017 Recommended Consults -- Oral Care Recommendations Oral care BID Other Recommendations --   CHL IP FOLLOW UP RECOMMENDATIONS 01/09/2017 Follow up Recommendations Skilled Nursing facility;Home health SLP   CHL IP FREQUENCY AND DURATION 01/09/2017 Speech Therapy Frequency (ACUTE ONLY) min 2x/week Treatment Duration 2 weeks      CHL IP ORAL PHASE 01/09/2017 Oral Phase Impaired Oral - Pudding Teaspoon -- Oral - Pudding Cup -- Oral - Honey Teaspoon -- Oral - Honey Cup -- Oral - Nectar Teaspoon Lingual pumping;Reduced posterior propulsion;Lingual/palatal residue;Delayed oral transit;Decreased bolus cohesion;Premature spillage Oral - Nectar Cup -- Oral - Nectar Straw -- Oral - Thin Teaspoon Lingual pumping;Reduced posterior propulsion;Lingual/palatal residue;Delayed oral transit;Decreased bolus cohesion;Premature spillage Oral - Thin Cup Lingual pumping;Reduced posterior propulsion;Lingual/palatal residue;Delayed oral transit;Decreased bolus cohesion;Premature spillage Oral - Thin Straw Lingual pumping;Reduced posterior propulsion;Lingual/palatal residue;Delayed oral transit;Decreased bolus cohesion;Premature spillage Oral - Puree Lingual pumping;Reduced posterior propulsion;Lingual/palatal residue;Delayed oral transit;Decreased bolus cohesion;Premature spillage Oral - Mech Soft Other (Comment) Oral - Regular -- Oral - Multi-Consistency -- Oral - Pill -- Oral Phase - Comment --  No flowsheet data found. No flowsheet data found. No flowsheet data found. Harlon Ditty, [place] CCC-SLP [phone] Claudine Mouton 01/09/2017, 11:46 AM  Ct Renal Stone Study  Result Date: 12/20/2016 CLINICAL DATA:   Increasing lethargy. History of recurrent urinary tract infections and Down syndrome. EXAM: CT ABDOMEN AND PELVIS WITHOUT CONTRAST TECHNIQUE: Multidetector CT imaging of the abdomen and pelvis was performed following the standard protocol without IV contrast. COMPARISON:  None. FINDINGS: Lower chest: The visualized cardiac chambers are mildly enlarged but stable. No pericardial effusion. There is minimal bibasilar atelectasis without pneumonic consolidation, effusion or pneumothorax. Hepatobiliary: Cholecystectomy. No biliary dilatation or hepatic mass. Pancreas: No pancreatic ductal dilatation. The noncontrast appearance of the pancreas is unremarkable. Spleen: Normal Adrenals/Urinary Tract: Stable normal appearing bilateral adrenal glands. Hydronephrosis noted bilaterally with faint calcifications in the interpolar aspect of the right kidney again noted. There appears to be a 3 mm calcification in the distal left UVJ, series 2, image 63 which may explain the dilated left renal collecting system. No calculus is seen on the right. The bladder however is markedly distended and vesicoureteral reflux accounting for this appearance may explain this appearance versus prior passage of a stone. A ureteral stricture is not entirely excluded or potentially a urothelial lesion but no conclusive CT findings to suggest such. Stomach/Bowel: No bowel dilatation. Stomach is contracted. There appears to be normal small bowel rotation. A moderate amount of stool is seen within large bowel. There is perirectal inflammation with thickening of rectal walls surrounding a large focus of stool. Findings would be in keeping with proctitis. Vascular/Lymphatic: No significant vascular findings are present. No enlarged abdominal or pelvic lymph nodes. Reproductive: Prostate is unremarkable. Other: No abdominal wall hernia or abnormality. No abdominopelvic ascites. Musculoskeletal: Mild dextroconvex curvature of the lumbar spine with  degenerative disc disease L1 through L3. Grade 1 anterolisthesis of L4 on L5 with L3 through S1 facet arthropathy. IMPRESSION: 1. 3 mm left ureterovesical junction stone causing moderate left-sided hydronephrosis. 2. Hydronephrosis of the right renal collecting system without obstructive uropathy noted. Findings could be related to a recently passed stone. Given the distended appearance of the bladder, this could be a result vesicoureteral reflux alternatively. Other etiologies including stricture or a urothelial lesion are not entirely excluded but believed less likely. 3. Rectal thickening with perirectal inflammation surrounding a large stool ball consistent with proctitis. 4. Lumbar spondylosis with dextroscoliosis. Electronically Signed   By: Tollie Eth M.D.   On: 12/20/2016 19:42    DISCHARGE EXAMINATION: Vitals:   01/16/17 0700 01/16/17 0800 01/16/17 0900 01/16/17 1030  BP: (!) 84/74 (!) 96/53 (!) 86/59 (!) 86/57  Pulse: 70 65 69 73  Resp: 19 (!) 25 (!) 21 20  Temp: 98.7 F (37.1 C) 98.7 F (37.1 C)  98.2 F (36.8 C)  TempSrc: Oral   Axillary  SpO2: 96% 99% 99% 97%  Weight:      Height:       General appearance: uncooperative Resp: Diminished air entry at the bases. No wheezing, rales or rhonchi Cardio: regular rate and rhythm, S1, S2 normal, no murmur, click, rub or gallop GI: soft, non-tender; bowel sounds normal; no masses,  no organomegaly   DISPOSITION: SNF with hospice services  Discharge Instructions    Discharge instructions    Complete by:  As directed    Hospice to follow at the skilled nursing facility. CODE STATUS to be addressed by patient's guardian and hospice.  You were cared for by a hospitalist during your hospital stay. If you have any questions about your discharge medications or the care you received while you were in the hospital after you  are discharged, you can call the unit and asked to speak with the hospitalist on call if the hospitalist that took  care of you is not available. Once you are discharged, your primary care physician will handle any further medical issues. Please note that NO REFILLS for any discharge medications will be authorized once you are discharged, as it is imperative that you return to your primary care physician (or establish a relationship with a primary care physician if you do not have one) for your aftercare needs so that they can reassess your need for medications and monitor your lab values. If you do not have a primary care physician, you can call [phone] for a physician referral.   Increase activity slowly    Complete by:  As directed       ALLERGIES: No Known Allergies   Current Discharge Medication List    START taking these medications   Details  Maltodextrin-Xanthan Gum (RESOURCE THICKENUP CLEAR) POWD To thicken fluids    midodrine (PROAMATINE) 2.5 MG tablet Take 1 tablet (2.5 mg total) by mouth 2 (two) times daily with a meal.    Morphine Sulfate (MORPHINE CONCENTRATE) 10 MG/0.5ML SOLN concentrated solution Place 0.25 mLs (5 mg total) under the tongue every 3 (three) hours as needed for severe pain. Qty: 15 mL, Refills: 0    pantoprazole (PROTONIX) 40 MG tablet Take 1 tablet (40 mg total) by mouth daily at 12 noon.      CONTINUE these medications which have CHANGED   Details  tamsulosin (FLOMAX) 0.4 MG CAPS capsule Take 1 capsule (0.4 mg total) by mouth daily after supper. Qty: 30 capsule      CONTINUE these medications which have NOT CHANGED   Details  acetaminophen (TYLENOL) 325 MG tablet Take 650 mg by mouth every 4 (four) hours as needed for mild pain, moderate pain, fever or headache.    albuterol (PROVENTIL HFA;VENTOLIN HFA) 108 (90 Base) MCG/ACT inhaler Inhale 2 puffs into the lungs every 6 (six) hours as needed for wheezing or shortness of breath.     albuterol (PROVENTIL) (2.5 MG/3ML) 0.083% nebulizer solution Take 2.5 mg by nebulization 2 (two) times daily.     allopurinol  (ZYLOPRIM) 100 MG tablet Take 100 mg by mouth at bedtime.     aspirin 81 MG chewable tablet Chew 81 mg by mouth daily.    clindamycin (CLINDAGEL) 1 % gel Apply 1 application topically daily.     fluticasone (FLONASE) 50 MCG/ACT nasal spray Place 1 spray into both nostrils daily.     hydrocortisone cream 1 % Apply 1 application topically 4 (four) times daily as needed for itching (and/or bug bites).    lactulose (CHRONULAC) 10 GM/15ML solution Take 10 g by mouth daily as needed for moderate constipation.     levothyroxine (SYNTHROID, LEVOTHROID) 25 MCG tablet Take 25 mcg by mouth daily before breakfast.    Melatonin 3 MG TABS Take 3 mg by mouth at bedtime.    Memantine HCl-Donepezil HCl (NAMZARIC) 28-10 MG CP24 Take 1 capsule by mouth daily.     mineral oil-hydrophilic petrolatum (AQUAPHOR) ointment Apply 1 application topically 2 (two) times daily. Pt applies to hands and affected skin.    potassium chloride (K-DUR) 10 MEQ tablet Take 1 tablet (10 mEq total) by mouth daily. Qty: 30 tablet, Refills: 0    pravastatin (PRAVACHOL) 20 MG tablet Take 20 mg by mouth at bedtime.     ranitidine (ZANTAC) 150 MG tablet Take 150 mg by mouth  2 (two) times daily.      STOP taking these medications     amoxicillin-clavulanate (AUGMENTIN) 875-125 MG tablet         TOTAL DISCHARGE TIME: 35 mins  Springfield Ambulatory Surgery CenterKRISHNAN,Floyed Masoud  [facility] Pager [phone]  01/16/2017, 1:20 PM

## 2017-01-16 NOTE — Progress Notes (Signed)
[facility]

## 2017-02-01 ENCOUNTER — Ambulatory Visit: Payer: Medicare Other | Admitting: Endocrinology

## 2017-02-05 ENCOUNTER — Ambulatory Visit: Payer: Medicare Other | Admitting: Endocrinology

## 2017-02-06 ENCOUNTER — Telehealth: Payer: Self-pay | Admitting: Endocrinology

## 2017-02-06 NOTE — Telephone Encounter (Signed)
Patient no showed today's appt. Please advise on how to follow up. °A. No follow up necessary. °B. Follow up urgent. Contact patient immediately. °C. Follow up necessary. Contact patient and schedule visit in ___ days. °D. Follow up advised. Contact patient and schedule visit in ____weeks. ° °

## 2017-02-07 ENCOUNTER — Encounter: Payer: Self-pay | Admitting: Gastroenterology

## 2017-02-09 NOTE — Telephone Encounter (Signed)
.   Follow up necessary. Contact patient and schedule visit in 15-20 days. 

## 2017-02-12 NOTE — Telephone Encounter (Signed)
LM with Home to have them call back to schedule

## 2017-02-17 DEATH — deceased

## 2018-08-28 IMAGING — US US ABDOMEN COMPLETE
1 series · 14 of 25 positions shown · non-contrast
Comparison: CT Abdomen and Pelvis without contrast 12/20/2016 and
earlier

CLINICAL DATA: 65-year-old male with bilateral hydronephrosis. 3 mm
left UVJ calculus. Right side obstructing etiology not evident on
recent CT.

EXAM:
ABDOMEN ULTRASOUND COMPLETE

[Series 1: us abdomen complete · 0.28mm/px · 14 of 67 slices shown]
[im 1/67]
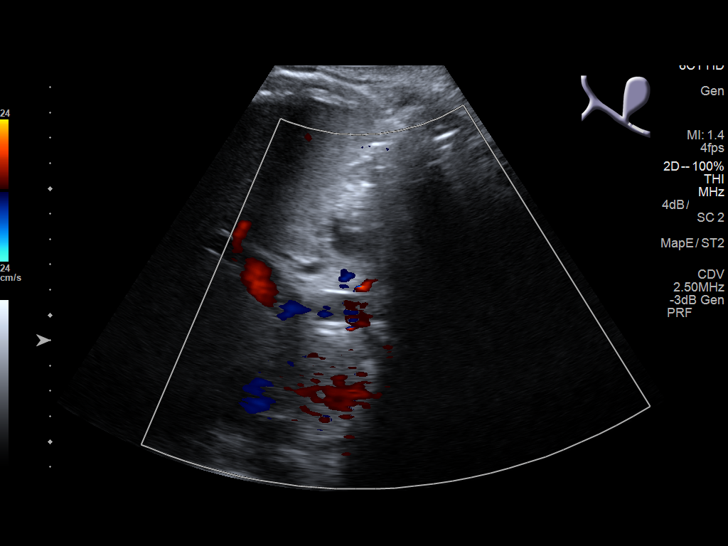
[im 6/67]
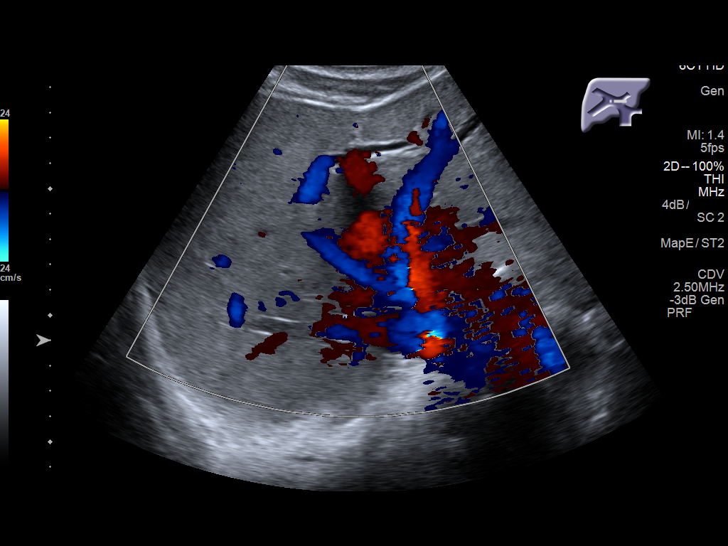
[im 12/67]
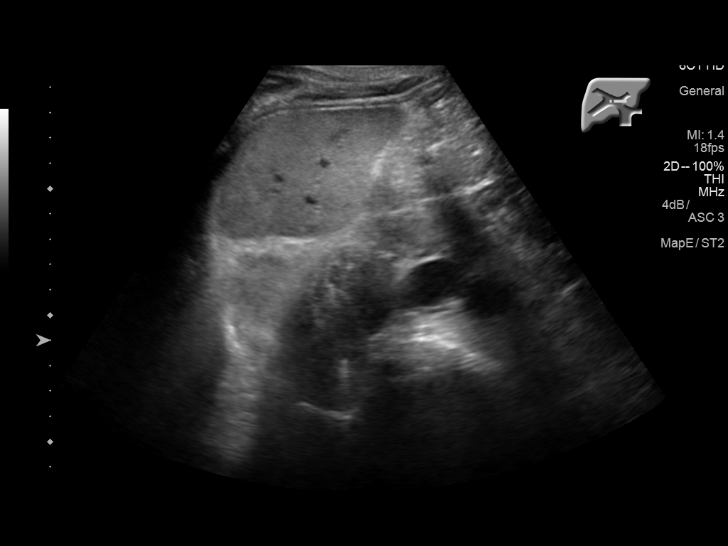
[im 17/67]
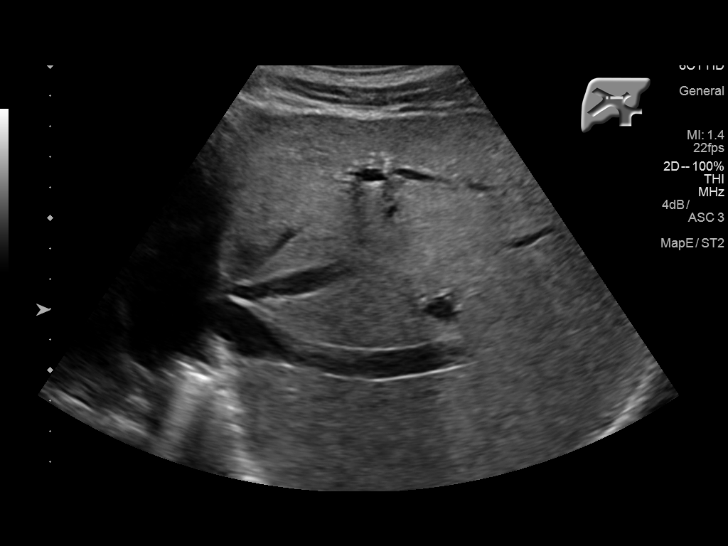
[im 23/67]
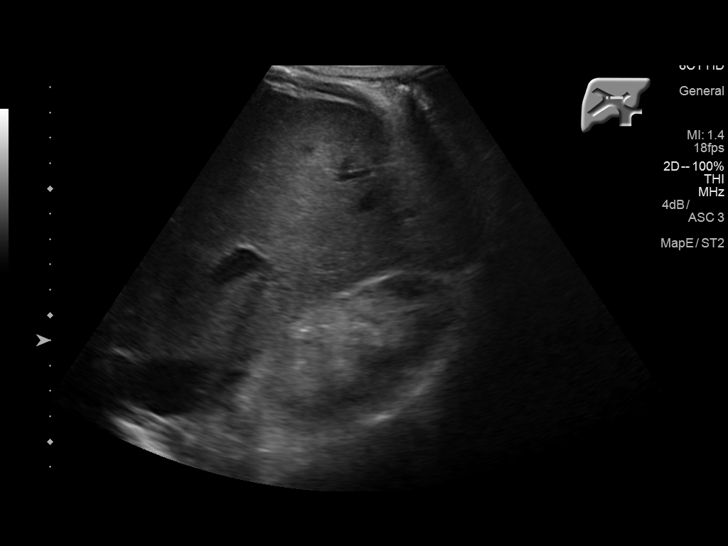
[im 25/67]
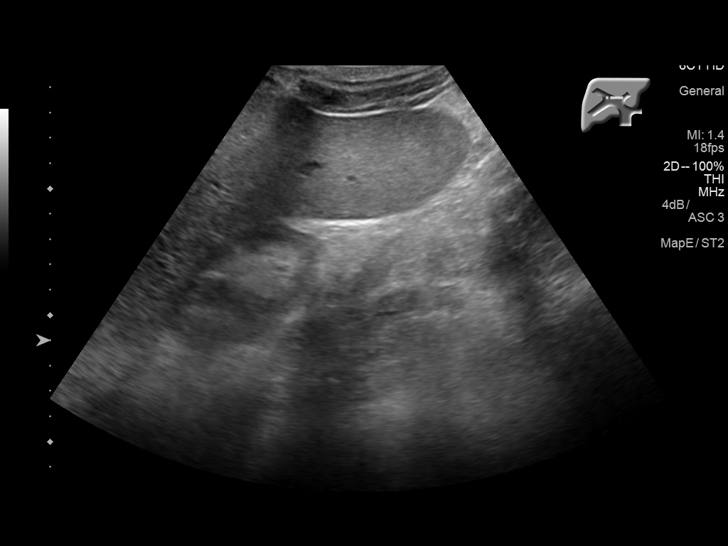
[im 31/67]
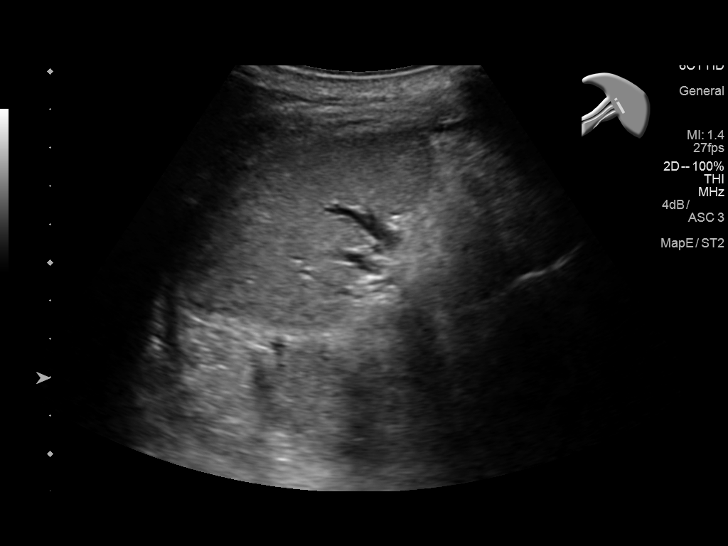
[im 36/67]
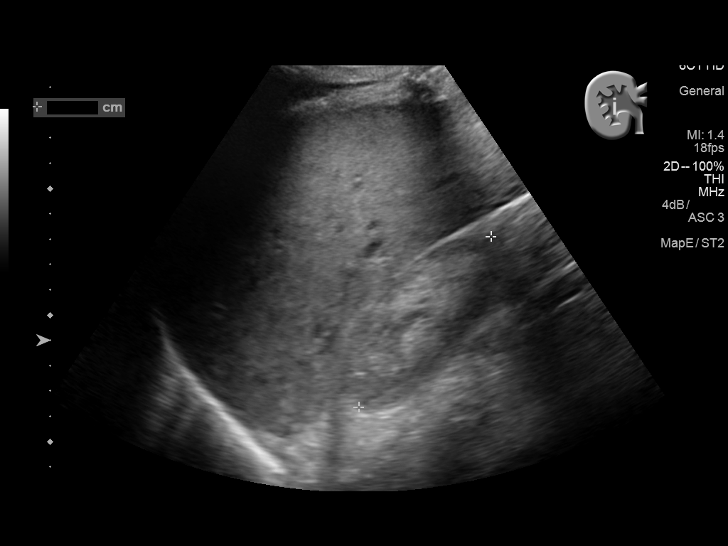
[im 42/67]
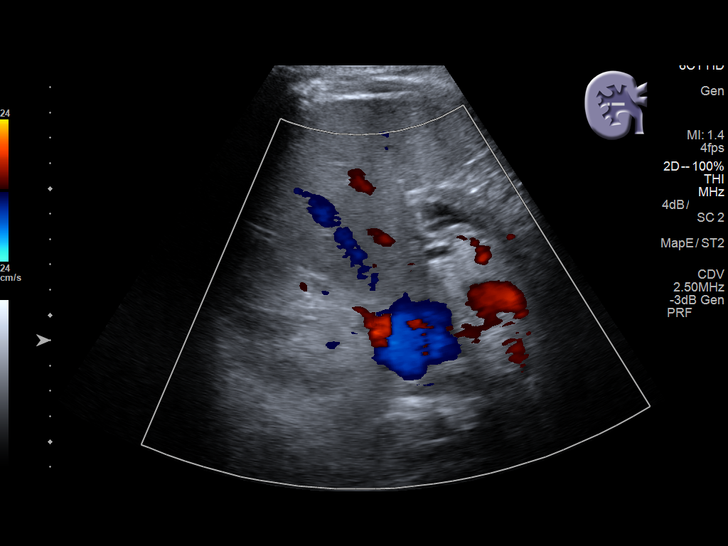
[im 45/67]
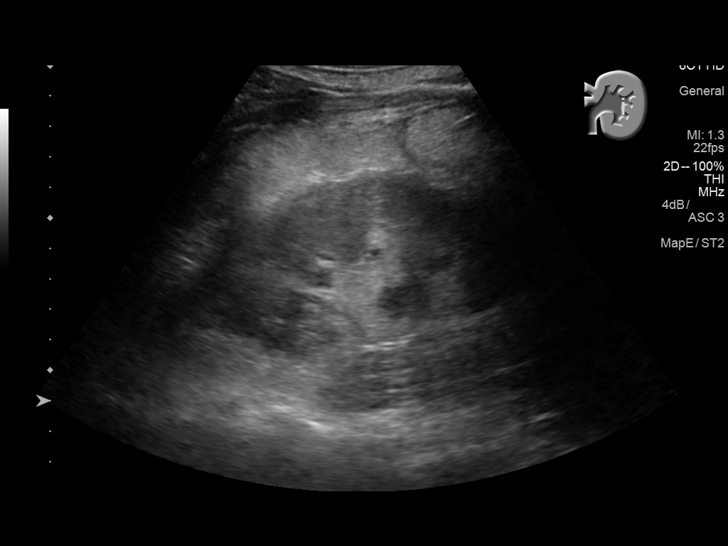
[im 50/67]
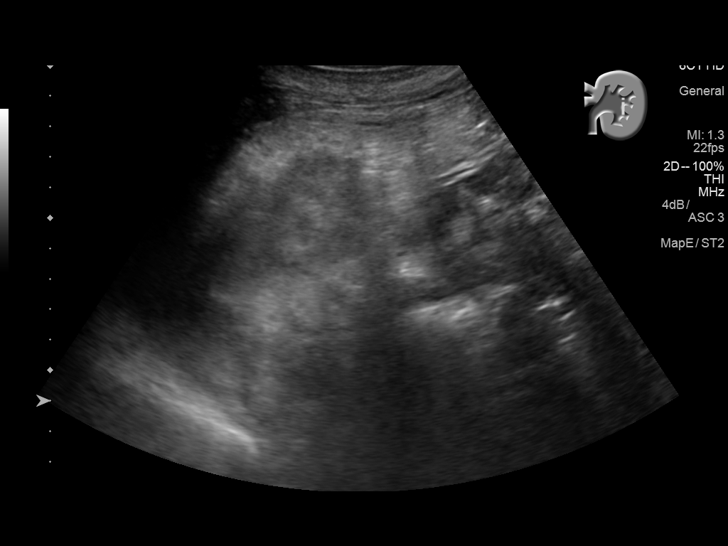
[im 56/67]
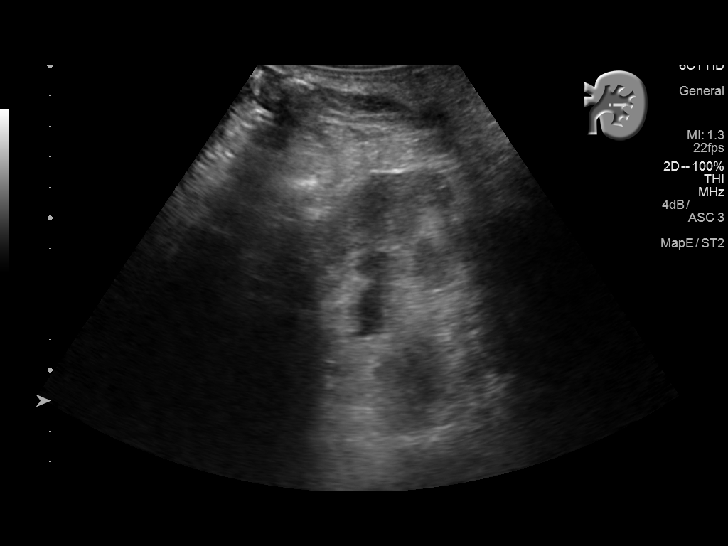
[im 61/67]
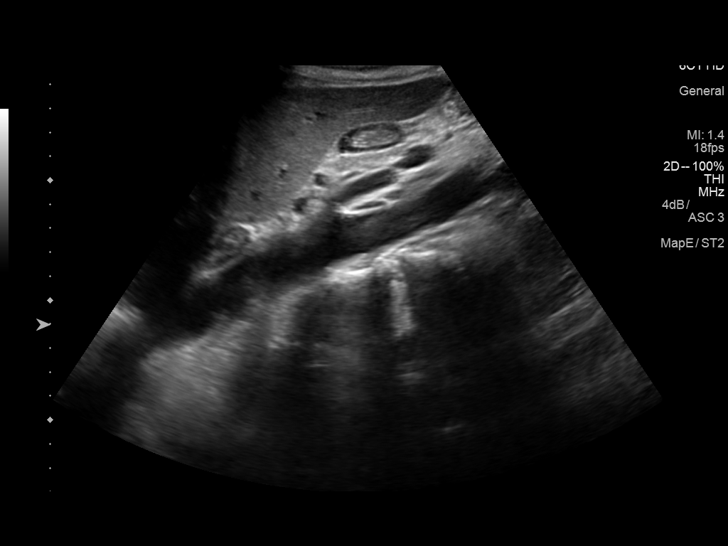
[im 67/67]
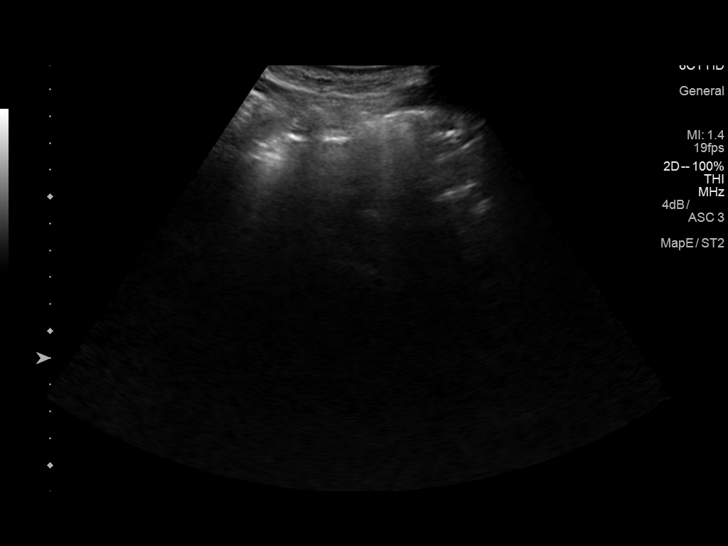

[14 of 25 positions shown; findings below may reference images not displayed]

FINDINGS: Gallbladder: Surgically absent.

Common bile duct: Diameter: 6 mm, within normal limits

Liver: No focal lesion identified. Within normal limits in
parenchymal echogenicity.

IVC: No abnormality visualized.

Pancreas: Visualized portion unremarkable.

Spleen: Size and appearance within normal limits.

Right Kidney: Length: 8.5 cm. No evidence of right hydronephrosis
today, renal sinus fat is visible. Renal cortical volume loss noted.

Left Kidney: Length: 9.7 cm. Mild left hydronephrosis, mildly
improved from recent CT. Less pronounced renal cortical volume loss.

Abdominal aorta: No aneurysm visualized.

Other findings: None.
IMPRESSION: 1. Resolved right side and regressed left side hydronephrosis since
12/20/2016.
[DATE]. Otherwise negative ultrasound appearance of the abdomen.
